# Patient Record
Sex: Male | Born: 1935 | Race: Black or African American | Hispanic: No | Marital: Married | Smoking: Former smoker
Health system: Southern US, Community
[De-identification: ages and names within clinical notes are randomized; demographics above are authoritative.]

## PROBLEM LIST (undated history)

## (undated) DIAGNOSIS — K648 Other hemorrhoids: Secondary | ICD-10-CM

## (undated) DIAGNOSIS — I509 Heart failure, unspecified: Secondary | ICD-10-CM

## (undated) DIAGNOSIS — I739 Peripheral vascular disease, unspecified: Secondary | ICD-10-CM

## (undated) DIAGNOSIS — I255 Ischemic cardiomyopathy: Secondary | ICD-10-CM

## (undated) DIAGNOSIS — I779 Disorder of arteries and arterioles, unspecified: Secondary | ICD-10-CM

## (undated) DIAGNOSIS — I2510 Atherosclerotic heart disease of native coronary artery without angina pectoris: Secondary | ICD-10-CM

## (undated) DIAGNOSIS — E785 Hyperlipidemia, unspecified: Secondary | ICD-10-CM

## (undated) DIAGNOSIS — I10 Essential (primary) hypertension: Secondary | ICD-10-CM

## (undated) DIAGNOSIS — Z9289 Personal history of other medical treatment: Secondary | ICD-10-CM

## (undated) DIAGNOSIS — F0390 Unspecified dementia without behavioral disturbance: Secondary | ICD-10-CM

## (undated) DIAGNOSIS — K279 Peptic ulcer, site unspecified, unspecified as acute or chronic, without hemorrhage or perforation: Secondary | ICD-10-CM

## (undated) DIAGNOSIS — N183 Chronic kidney disease, stage 3 unspecified: Secondary | ICD-10-CM

## (undated) DIAGNOSIS — C61 Malignant neoplasm of prostate: Secondary | ICD-10-CM

## (undated) HISTORY — DX: Peptic ulcer, site unspecified, unspecified as acute or chronic, without hemorrhage or perforation: K27.9

## (undated) HISTORY — DX: Malignant neoplasm of prostate: C61

## (undated) HISTORY — DX: Disorder of arteries and arterioles, unspecified: I77.9

## (undated) HISTORY — DX: Other hemorrhoids: K64.8

## (undated) HISTORY — DX: Personal history of other medical treatment: Z92.89

## (undated) HISTORY — DX: Hyperlipidemia, unspecified: E78.5

## (undated) HISTORY — DX: Peripheral vascular disease, unspecified: I73.9

---

## 1999-05-22 ENCOUNTER — Other Ambulatory Visit: Admission: RE | Admit: 1999-05-22 | Discharge: 1999-05-22 | Payer: Self-pay | Admitting: *Deleted

## 1999-06-11 ENCOUNTER — Encounter: Admission: RE | Admit: 1999-06-11 | Discharge: 1999-09-09 | Payer: Self-pay | Admitting: Radiation Oncology

## 1999-08-02 ENCOUNTER — Encounter: Admission: RE | Admit: 1999-08-02 | Discharge: 1999-08-02 | Payer: Self-pay | Admitting: *Deleted

## 1999-08-02 ENCOUNTER — Encounter: Payer: Self-pay | Admitting: *Deleted

## 1999-08-09 ENCOUNTER — Ambulatory Visit (HOSPITAL_BASED_OUTPATIENT_CLINIC_OR_DEPARTMENT_OTHER): Admission: RE | Admit: 1999-08-09 | Discharge: 1999-08-09 | Payer: Self-pay | Admitting: *Deleted

## 1999-08-09 ENCOUNTER — Encounter: Payer: Self-pay | Admitting: *Deleted

## 1999-09-02 ENCOUNTER — Encounter: Payer: Self-pay | Admitting: Radiation Oncology

## 1999-09-20 ENCOUNTER — Encounter: Admission: RE | Admit: 1999-09-20 | Discharge: 1999-12-19 | Payer: Self-pay | Admitting: Radiation Oncology

## 1999-09-28 ENCOUNTER — Emergency Department (HOSPITAL_COMMUNITY): Admission: EM | Admit: 1999-09-28 | Discharge: 1999-09-29 | Payer: Self-pay | Admitting: Emergency Medicine

## 1999-09-29 ENCOUNTER — Emergency Department (HOSPITAL_COMMUNITY): Admission: EM | Admit: 1999-09-29 | Discharge: 1999-09-29 | Payer: Self-pay | Admitting: Emergency Medicine

## 1999-10-02 ENCOUNTER — Emergency Department (HOSPITAL_COMMUNITY): Admission: EM | Admit: 1999-10-02 | Discharge: 1999-10-02 | Payer: Self-pay

## 1999-10-03 ENCOUNTER — Emergency Department (HOSPITAL_COMMUNITY): Admission: EM | Admit: 1999-10-03 | Discharge: 1999-10-03 | Payer: Self-pay | Admitting: Emergency Medicine

## 2000-07-03 ENCOUNTER — Emergency Department (HOSPITAL_COMMUNITY): Admission: EM | Admit: 2000-07-03 | Discharge: 2000-07-04 | Payer: Self-pay | Admitting: Emergency Medicine

## 2000-07-04 ENCOUNTER — Encounter: Payer: Self-pay | Admitting: Emergency Medicine

## 2000-12-30 ENCOUNTER — Ambulatory Visit (HOSPITAL_COMMUNITY): Admission: RE | Admit: 2000-12-30 | Discharge: 2000-12-30 | Payer: Self-pay | Admitting: Gastroenterology

## 2001-05-12 HISTORY — PX: CARDIAC SURGERY: SHX584

## 2001-06-17 ENCOUNTER — Inpatient Hospital Stay (HOSPITAL_COMMUNITY): Admission: EM | Admit: 2001-06-17 | Discharge: 2001-06-22 | Payer: Self-pay | Admitting: Emergency Medicine

## 2001-06-17 ENCOUNTER — Encounter: Payer: Self-pay | Admitting: Emergency Medicine

## 2001-11-09 ENCOUNTER — Inpatient Hospital Stay (HOSPITAL_COMMUNITY): Admission: EM | Admit: 2001-11-09 | Discharge: 2001-11-15 | Payer: Self-pay | Admitting: *Deleted

## 2001-11-11 ENCOUNTER — Encounter: Payer: Self-pay | Admitting: Cardiology

## 2001-12-01 ENCOUNTER — Inpatient Hospital Stay (HOSPITAL_COMMUNITY): Admission: EM | Admit: 2001-12-01 | Discharge: 2001-12-02 | Payer: Self-pay

## 2001-12-14 ENCOUNTER — Encounter (HOSPITAL_COMMUNITY): Admission: RE | Admit: 2001-12-14 | Discharge: 2002-03-14 | Payer: Self-pay | Admitting: Cardiology

## 2002-01-09 ENCOUNTER — Encounter: Payer: Self-pay | Admitting: Emergency Medicine

## 2002-01-10 ENCOUNTER — Inpatient Hospital Stay (HOSPITAL_COMMUNITY): Admission: EM | Admit: 2002-01-10 | Discharge: 2002-01-11 | Payer: Self-pay | Admitting: Emergency Medicine

## 2002-01-11 ENCOUNTER — Encounter: Payer: Self-pay | Admitting: Cardiology

## 2002-03-15 ENCOUNTER — Encounter (HOSPITAL_COMMUNITY): Admission: RE | Admit: 2002-03-15 | Discharge: 2002-04-14 | Payer: Self-pay | Admitting: Cardiology

## 2002-04-18 ENCOUNTER — Encounter: Payer: Self-pay | Admitting: *Deleted

## 2002-04-18 ENCOUNTER — Inpatient Hospital Stay (HOSPITAL_COMMUNITY): Admission: EM | Admit: 2002-04-18 | Discharge: 2002-04-20 | Payer: Self-pay | Admitting: Emergency Medicine

## 2002-07-08 ENCOUNTER — Inpatient Hospital Stay (HOSPITAL_COMMUNITY): Admission: EM | Admit: 2002-07-08 | Discharge: 2002-07-21 | Payer: Self-pay | Admitting: Emergency Medicine

## 2002-07-08 ENCOUNTER — Encounter: Payer: Self-pay | Admitting: Cardiology

## 2002-07-14 ENCOUNTER — Encounter: Payer: Self-pay | Admitting: Cardiothoracic Surgery

## 2002-07-15 ENCOUNTER — Encounter: Payer: Self-pay | Admitting: Cardiothoracic Surgery

## 2002-07-16 ENCOUNTER — Encounter: Payer: Self-pay | Admitting: Cardiothoracic Surgery

## 2002-07-17 ENCOUNTER — Encounter: Payer: Self-pay | Admitting: Cardiothoracic Surgery

## 2002-08-08 ENCOUNTER — Encounter (HOSPITAL_COMMUNITY): Admission: RE | Admit: 2002-08-08 | Discharge: 2002-11-06 | Payer: Self-pay | Admitting: Cardiology

## 2002-08-12 ENCOUNTER — Emergency Department (HOSPITAL_COMMUNITY): Admission: EM | Admit: 2002-08-12 | Discharge: 2002-08-12 | Payer: Self-pay | Admitting: Emergency Medicine

## 2002-08-16 ENCOUNTER — Ambulatory Visit (HOSPITAL_COMMUNITY): Admission: RE | Admit: 2002-08-16 | Discharge: 2002-08-16 | Payer: Self-pay | Admitting: Cardiothoracic Surgery

## 2002-08-16 ENCOUNTER — Encounter: Payer: Self-pay | Admitting: Cardiothoracic Surgery

## 2002-08-17 ENCOUNTER — Encounter: Admission: RE | Admit: 2002-08-17 | Discharge: 2002-08-17 | Payer: Self-pay | Admitting: Cardiothoracic Surgery

## 2002-08-17 ENCOUNTER — Encounter: Payer: Self-pay | Admitting: Cardiothoracic Surgery

## 2002-11-07 ENCOUNTER — Encounter (HOSPITAL_COMMUNITY): Admission: RE | Admit: 2002-11-07 | Discharge: 2003-02-05 | Payer: Self-pay | Admitting: Cardiology

## 2004-07-01 ENCOUNTER — Ambulatory Visit: Payer: Self-pay | Admitting: Cardiology

## 2004-07-10 ENCOUNTER — Ambulatory Visit: Payer: Self-pay | Admitting: Cardiology

## 2004-07-11 ENCOUNTER — Ambulatory Visit: Payer: Self-pay

## 2004-07-11 ENCOUNTER — Ambulatory Visit: Payer: Self-pay | Admitting: Cardiology

## 2004-09-23 ENCOUNTER — Ambulatory Visit: Payer: Self-pay | Admitting: Cardiology

## 2005-05-14 ENCOUNTER — Ambulatory Visit: Payer: Self-pay | Admitting: Cardiology

## 2005-06-17 ENCOUNTER — Ambulatory Visit: Payer: Self-pay | Admitting: Cardiology

## 2005-08-17 ENCOUNTER — Emergency Department (HOSPITAL_COMMUNITY): Admission: EM | Admit: 2005-08-17 | Discharge: 2005-08-17 | Payer: Self-pay | Admitting: Emergency Medicine

## 2006-07-07 ENCOUNTER — Emergency Department (HOSPITAL_COMMUNITY): Admission: EM | Admit: 2006-07-07 | Discharge: 2006-07-07 | Payer: Self-pay | Admitting: Emergency Medicine

## 2006-10-21 ENCOUNTER — Emergency Department (HOSPITAL_COMMUNITY): Admission: EM | Admit: 2006-10-21 | Discharge: 2006-10-21 | Payer: Self-pay | Admitting: Emergency Medicine

## 2007-02-11 ENCOUNTER — Emergency Department (HOSPITAL_COMMUNITY): Admission: EM | Admit: 2007-02-11 | Discharge: 2007-02-11 | Payer: Self-pay | Admitting: Emergency Medicine

## 2007-03-05 ENCOUNTER — Ambulatory Visit: Payer: Self-pay | Admitting: Cardiology

## 2007-03-30 ENCOUNTER — Encounter: Admission: RE | Admit: 2007-03-30 | Discharge: 2007-03-30 | Payer: Self-pay | Admitting: Sports Medicine

## 2007-04-20 ENCOUNTER — Encounter: Admission: RE | Admit: 2007-04-20 | Discharge: 2007-04-20 | Payer: Self-pay | Admitting: Sports Medicine

## 2008-03-14 ENCOUNTER — Ambulatory Visit: Payer: Self-pay | Admitting: Cardiology

## 2008-04-13 ENCOUNTER — Ambulatory Visit: Payer: Self-pay

## 2008-04-13 ENCOUNTER — Ambulatory Visit: Payer: Self-pay | Admitting: Cardiology

## 2008-04-13 LAB — CONVERTED CEMR LAB
Chloride: 108 meq/L (ref 96–112)
Creatinine, Ser: 1.2 mg/dL (ref 0.4–1.5)
GFR calc Af Amer: 77 mL/min
GFR calc non Af Amer: 63 mL/min
Glucose, Bld: 95 mg/dL (ref 70–99)
LDL Cholesterol: 113 mg/dL — ABNORMAL HIGH (ref 0–99)
Potassium: 4.5 meq/L (ref 3.5–5.1)
Sodium: 142 meq/L (ref 135–145)
Total CHOL/HDL Ratio: 3.8
VLDL: 7 mg/dL (ref 0–40)

## 2008-08-21 ENCOUNTER — Encounter: Admission: RE | Admit: 2008-08-21 | Discharge: 2008-08-21 | Payer: Self-pay | Admitting: Sports Medicine

## 2009-01-09 ENCOUNTER — Encounter (INDEPENDENT_AMBULATORY_CARE_PROVIDER_SITE_OTHER): Payer: Self-pay | Admitting: *Deleted

## 2009-03-16 ENCOUNTER — Telehealth: Payer: Self-pay | Admitting: Cardiology

## 2009-03-22 DIAGNOSIS — I5042 Chronic combined systolic (congestive) and diastolic (congestive) heart failure: Secondary | ICD-10-CM

## 2009-03-22 DIAGNOSIS — E785 Hyperlipidemia, unspecified: Secondary | ICD-10-CM

## 2009-03-22 DIAGNOSIS — I6529 Occlusion and stenosis of unspecified carotid artery: Secondary | ICD-10-CM

## 2009-03-22 DIAGNOSIS — C61 Malignant neoplasm of prostate: Secondary | ICD-10-CM

## 2009-03-22 DIAGNOSIS — I2510 Atherosclerotic heart disease of native coronary artery without angina pectoris: Secondary | ICD-10-CM

## 2009-03-22 DIAGNOSIS — I110 Hypertensive heart disease with heart failure: Secondary | ICD-10-CM | POA: Insufficient documentation

## 2009-03-23 ENCOUNTER — Ambulatory Visit: Payer: Self-pay | Admitting: Cardiology

## 2009-03-23 DIAGNOSIS — I080 Rheumatic disorders of both mitral and aortic valves: Secondary | ICD-10-CM | POA: Insufficient documentation

## 2009-04-06 ENCOUNTER — Ambulatory Visit (HOSPITAL_COMMUNITY): Admission: RE | Admit: 2009-04-06 | Discharge: 2009-04-06 | Payer: Self-pay | Admitting: Cardiology

## 2009-04-06 ENCOUNTER — Encounter: Payer: Self-pay | Admitting: Cardiology

## 2009-04-06 ENCOUNTER — Ambulatory Visit: Payer: Self-pay | Admitting: Internal Medicine

## 2009-04-06 ENCOUNTER — Ambulatory Visit: Payer: Self-pay

## 2009-04-06 ENCOUNTER — Ambulatory Visit: Payer: Self-pay | Admitting: Cardiology

## 2009-04-09 ENCOUNTER — Encounter: Payer: Self-pay | Admitting: Cardiology

## 2009-04-09 LAB — CONVERTED CEMR LAB
Cholesterol: 179 mg/dL (ref 0–200)
LDL Cholesterol: 126 mg/dL — ABNORMAL HIGH (ref 0–99)
Total CHOL/HDL Ratio: 4
VLDL: 13.2 mg/dL (ref 0.0–40.0)

## 2009-04-10 ENCOUNTER — Ambulatory Visit: Payer: Self-pay

## 2009-04-10 ENCOUNTER — Ambulatory Visit: Payer: Self-pay | Admitting: Cardiology

## 2009-04-11 ENCOUNTER — Telehealth: Payer: Self-pay | Admitting: Cardiology

## 2009-10-07 ENCOUNTER — Emergency Department (HOSPITAL_COMMUNITY): Admission: EM | Admit: 2009-10-07 | Discharge: 2009-10-08 | Payer: Self-pay | Admitting: Emergency Medicine

## 2010-04-05 ENCOUNTER — Encounter: Admission: RE | Admit: 2010-04-05 | Discharge: 2010-04-05 | Payer: Self-pay | Admitting: Family Medicine

## 2010-06-01 ENCOUNTER — Encounter: Payer: Self-pay | Admitting: Family Medicine

## 2010-06-02 ENCOUNTER — Encounter: Payer: Self-pay | Admitting: Sports Medicine

## 2010-09-24 NOTE — Assessment & Plan Note (Signed)
[facility]                            CARDIOLOGY OFFICE NOTE   NAME:FOXShamere, Wilhelmi                         MRN:          [id number]  DATE:03/14/2008                            DOB:          [birth year]    PRIMARY CARE PHYSICIAN:  Azalia Bilis, MD.   REASON FOR PRESENTATION:  Evaluate the patient with coronary artery  disease.   HISTORY OF PRESENT ILLNESS:  The patient presents for yearly followup.  He is now 76 years old.  He has done relatively well, since I last saw  him.  As his usual, he is not very clear on his medications.  He does  not even getting verify his primary care doctor.  However, he is a nice  gentleman.  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  He with that level of activity does not get any chest discomfort, neck,  or arm discomfort.  He has not have any palpitations, presyncope, or  syncope.  He denies any PND or orthopnea.  He has little swelling in his  right leg, which goes down after he puts his feet up.  He is taking his  medicines as prescribed, but does not remember the doses.  We called his  wife to verify and have the list as described below.  He has had no  further cardiovascular testing since his plain old exercise treadmill  test (POET) in 2007.   PAST MEDICAL HISTORY:  Coronary artery disease, status post CABG (SVG to  the LAD, SVG to right coronary artery, SVG to marginal in 2003), well-  preserved ejection fraction, elevated CPKs on statin, elevated liver  enzymes on statins, internal hemorrhoids, hypertension, hyperlipidemia,  prostate cancer, status post radiation implants, peptic ulcer disease,  mild (less than 39%) bilateral carotid stenosis.   ALLERGIES AND INTOLERANCES:  ACE INHIBITOR caused anaphylaxis.   MEDICATIONS:  1. Aspirin 81 mg daily.  2. Toprol 25 mg daily.  3. Zetia 10 mg daily.   REVIEW OF SYSTEMS:  As stated in the HPI and otherwise negative for all  other  systems.   PHYSICAL EXAMINATION:  GENERAL:  The patient is pleasant in no distress.  VITAL SIGNS:  Blood pressure 150/74, heart rate 56 and regular, weight  199 pounds, and body mass index 31.  HEENT:  Eyelids are unremarkable; pupils equal, round, and reactive to  light; fundi not visualized; oral mucosa unremarkable.  NECK:  No jugular venous distention at 45 degrees; soft left carotid  bruit; no thyromegaly.  LYMPHATICS:  No cervical, axillary, or inguinal adenopathy.  LUNGS:  Clear to auscultation bilaterally.  BACK:  No costovertebral angle tenderness.  CHEST:  Unremarkable except for well-healed sternotomy scar.  HEART:  PMI not displaced or sustained; S1 and S2 within normal limits;  no S3, no S4; no clicks, no rubs, no murmurs.  ABDOMEN:  Flat; positive bowel sounds; normal in frequency and pitch; no  bruits, rebound, guarding, or midline pulsatile mass; no hepatomegaly;  no splenomegaly.  SKIN:  No rashes, no nodules.  EXTREMITIES:  Upper pulses 2+, 2+ femorals  with left femoral bruit, 2+  right posterior tibialis and 1+ left posterior tibialis, right mild  ankle edema; no cyanosis, no clubbing.  NEURO:  Oriented to person,  place, and time; cranial nerves II through XII grossly intact; motor  grossly intact.   EKG; sinus bradycardia, rate 56, left axis deviation, poor anterior R-  wave progression, no acute ST-T wave changes.   ASSESSMENT AND PLAN:  1. Coronary artery disease.  The patient is having no new symptoms.      He had a negative stress test in 2007.  At this point, no further      cardiovascular testing is suggested.  Instead, we will continue      with aggressive risk reduction.  2. Hypertension.  Blood pressure is elevated and it was the last time      as well.  I am going to take the liberty of adding a low-dose      hydrochlorothiazide 12.5 mg to his existing regimen.  We will get a      BMET in 2 weeks.  He can have his blood pressure followed by his       primary care doctor and here when I see him.  He also needs      therapeutic lifestyle changes (TLC) to include weight loss to bring      his blood pressure to target.  3. Dyslipidemia, when he comes back for his lab work in 2 weeks, we      will get a fasting lipid profile.  The goal would be an LDL less      than 100 and HDL greater than 40.  4. Carotid stenosis.  The patient had some mild carotid plaque in the      past.  He does have a bruit.  I am going to get a Doppler as it has      been several years.  5. Followup.  I will see the patient back yearly or sooner if needed.     [name], MD, [facility]  Electronically Signed    JH/MedQ  DD: 03/14/2008  DT: 03/15/2008  Job #: [id number]   cc:   Azalia Bilis, M.D.

## 2010-09-24 NOTE — Assessment & Plan Note (Signed)
[facility]                            CARDIOLOGY OFFICE NOTE   NAME:[name], [name]                         MRN:          [id number]  DATE:03/05/2007                            DOB:          [birth year]    PRIMARY CARE PHYSICIAN:  Azalia Bilis, MD.   REASON FOR VISIT:  Evaluate patient with coronary disease.   HISTORY OF PRESENT ILLNESS:  The patient returns for a followup.  It has  been about 18 months since I last saw him.  He is now 75 years old.  He  is still working at [employer].  However, he has had problems recently  because he had some back injury.  He is being evaluated by Raliegh Ip.  With his level of activity, he is denying any chest discomfort,  neck or arm discomfort.  He has not been having any palpitations,  presyncope, or syncope.  He has been having no PND or orthopnea.  Unfortunately, he stopped taking his medicines.  I have tried to  encourage him over time to continue these drugs, but I do not believe he  has for probably years.  He did have a stress test last year, which was  a plain old exercise treadmill (POET).  With this, he had no evidence of  ischemia and a reasonable exercise tolerance.   PAST MEDICAL HISTORY:  1. Coronary artery disease status post CABG (saphenous vein graft to      an LAD, saphenous vein graft to the right coronary artery,      saphenous vein graft to a marginal in 2003).  2. A well-preserved ejection fraction.  3. Elevated CPKs on statin.  4. Elevated liver enzymes on statins.  5. Internal hemorrhoids.  6. Hypertension.  7. Hyperlipidemia.  8. Prostate cancer status post radiation implants.  9. Peptic ulcer disease.  10.Mild (less than 39%) bilateral carotid stenosis.   ALLERGIES:  ACE INHIBITORS CAUSED ANAPHYLAXIS.   MEDICATIONS:  He is currently not taking any.  He was supposed to be  taking:  1. Aspirin 81 mg daily.  2. Toprol 25 mg daily.  3. Zetia 10 mg daily.   REVIEW OF SYSTEMS:   As stated in the HPI and otherwise negative for  other systems.   PHYSICAL EXAMINATION:  GENERAL:  The patient is in no distress.  VITAL SIGNS:  Blood pressure 148/78, heart rate 80 and regular, weight  193 pounds, body mass index 29.  HEENT:  Eyelids unremarkable, pupils equal, round, and react to light,  fundi not visualized, oral mucosa unremarkable.  NECK:  No jugulovenous distention at 45 degrees, carotid upstroke brisk  and symmetric, no bruits, no thyromegaly.  LYMPHATICS:  No adenopathy.  LUNGS:  Clear to auscultation bilaterally.  BACK:  No costovertebral angle tenderness.  CHEST WALL:  A healed sternotomy scar.  HEART:  PMI not displaced or sustained, S1 and S2 within normal limits,  no S3, no S4, no clicks, no rubs, no murmurs.  ABDOMEN:  Mildly obese, positive bowel sounds normal in frequency and  pitch, no bruits, rebound, guarding, or a  midline pulsatile mass, no  hepatomegaly, no splenomegaly.  SKIN:  No rashes, no nodules.  EXTREMITIES:  2+ pulses throughout, no edema, no cyanosis, no clubbing,  bilateral femoral bruits.  NEURO:  Oriented to person, place, and time, cranial nerves II-XII  grossly intact, motor grossly intact.   EKG:  Sinus rhythm, rate 80, left axis deviation, nonspecific diffuse T-  wave changes.   ASSESSMENT AND PLAN:  1. Coronary disease.  The patient is having no ongoing symptoms.  He      had a negative exercise treadmill test last year.  No further      cardiovascular testing is suggested.  However, I wish he would      participate more in secondary risk reduction.  2. Hypertension.  Blood pressure is slightly elevated.  His heart rate      is up a little bit, too.  I would like to have him back on the beta      blocker and we will renew this prescription for the same dose.  3. Dyslipidemia.  He has been INTOLERANT TO STATINS.  I am going to      ask him to restart the Zetia.  4. Followup.  I will see him back in one year or sooner if  needed.     [name], MD, [facility]  Electronically Signed    JH/MedQ  DD: 03/05/2007  DT: 03/06/2007  Job #: [id number]   cc:   Azalia Bilis, M.D.

## 2010-09-27 NOTE — Discharge Summary (Signed)
NAMEWIKTOR, [name] NO.:  [id number]   MEDICAL RECORD NO.:  [id number]                   PATIENT TYPE:  INP   LOCATION:  2017                                 FACILITY:  [facility]   PHYSICIAN:  Dola Argyle, M.D. [facility]              DATE OF BIRTH:  Sep 03, 1934   DATE OF ADMISSION:  04/18/2002  DATE OF DISCHARGE:  04/20/2002                           DISCHARGE SUMMARY - REFERRING   DISCHARGE DIAGNOSES:  1. Chest pain, resolved.  2. Hemorrhoids.  3. Known coronary artery disease.  4. Anemia.  5. Hypertension.  6. Hyperlipidemia.  7. Prostate cancer.  8. Left carotid stenosis, 50% left carotid stenosis.   HISTORY OF PRESENT ILLNESS:  The patient is a 76 year old male patient who  presented to the [facility] Emergency Room on April 18, 2002, for an evaluation of chest pain.  At this point he was noted also  to be severely anemic with a hemoglobin on presentation of 7.7.  He does  have a history of peptic ulcer disease and is treated by Dr. Jeneen Rinks L.  Edwards.  In addition, he has a known history of coronary artery disease and  had previously received a stent to the circumflex in February 2003.  A  Cardiolite scan in August 2003, was negative for ischemia with an ejection  fraction of 60%.  His electrocardiogram revealed sinus bradycardia, rate of  52, with no abnormality.  He did have some T-wave inversion in the inferior  leads, but this was unchanged since the last tracing.   LABORATORY DATA:  During the hospitalization revealed a hemoglobin of 8.6,  hematocrit 27.8, platelets 276.  Hemoccult negative.  Sodium 136, potassium  4.0, BUN 17, creatinine 1.1, albumin 2.9.  Her ______ enzymes were negative.  A total cholesterol 151, triglycerides 74, HDL 47, LDL 89.   HOSPITAL COURSE:  Because of his anemia, Dr. Oletta Lamas was consulted.  The  patient also underwent a transfusion during his hospitalization.  The  patient then underwent a  colonoscopy, and was found to have hemorrhoids, as  well as mild radiation proctitis.  Because his cardiac workup was negative,  and no source of bleeding was found, the patient was felt to be safe to go  home on April 20, 2002.   DISCHARGE MEDICATIONS:  1. Toprol XL 50 mg one p.o. q.d.  2. Protonix 40 mg one  p.o. q.d.  3. Enteric-coated aspirin one p.o. q.d.  4. Altace 5 mg one p.o. b.i.d.  5. Sublingual nitroglycerin p.r.n. chest pain.   ACTIVITY:  As tolerated.   DIET:  To resume his previous diet.   INSTRUCTIONS:  To call with questions or concerns.   FOLLOW UP:  He needs to call and schedule a followup with Dr. Oletta Lamas.  He  has a follow-up appointment with Dr. [name] on May 26, 2002, at  1:15 p.m.  Joesphine Bare, P.A. [facility]                      Dola Argyle, M.D. [facility]    LB/MEDQ  D:  05/23/2002  T:  05/23/2002  Job:  [id number]   cc:   Jeneen Rinks L. Rolla Flatten., M.D.  [address] [place]  [place] [address]  Fax: [phone]   [name], M.D.

## 2010-09-27 NOTE — Consult Note (Signed)
[facility]  Patient:    [name], [name] Visit Number: [id number] MRN: [id number]          Service Type: MED Location: [id number] Attending Physician:  [name] Dictated by:   Valentina Gu. Roxy Manns, M.D. Proc. Date: 11/10/01 Admit Date:  11/09/2001   CC:         [name], M.D. [facility] D. Lia Foyer, M.D. [facility]  Minette Brine, M.D.  [facility] Office   Consultation Report  REQUESTING PHYSICIAN:  Loretha Brasil. Lia Foyer, M.D.  PRIMARY CARDIOLOGIST:  [name], M.D.  PRIMARY CARE PHYSICIAN:  Minette Brine, M.D.  REASON FOR CONSULTATION:  Three-vessel coronary artery disease with class IV angina.  HISTORY OF PRESENT ILLNESS:  The patient is a 76 year old African-American male from [place] who works for [employer] as a Administrator. 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 He underwent cardiac catheterization by Dr. Percival Spanish and was found to have significant coronary artery disease. He ultimately underwent percutaneous coronary intervention with stenting of the left circumflex coronary artery at that time by Dr. Olevia Perches. The patient initially did well. The patient had a followup stress Cardiolite exam in March of this year. This test was notable for development of symptoms of chest pain as well as positive EKG changes during stress. However, the scintigraphic results were felt to be insignificant. The patient was treated medically. He was noted to have a tight ostial right coronary artery lesion at the time of his original catheterization which was not treated percutaneously and felt not to be approachable from percutaneous. The patient now returns with persistent episodes of chest pain. Symptoms are somewhat atypical at time, although he describes transient pounding and pressing-like pain  radiating across his left chest which seems to come and go sporadically both with exertion and at rest. The patient does report that his chest pain seems to be relieved by sublingual nitroglycerin. He was admitted to the hospital and ruled out for acute myocardial infarction. He underwent cardiac catheterization today by Dr. Lia Foyer. This demonstrates severe three-vessel coronary artery disease with normal left ventricular function. Cardiac surgical consultation is requested.  REVIEW OF SYSTEMS:  GENERAL:  The patient reports overall feeling reasonably well. He denies any problems with loss of appetite or recent weight gain or weight loss. CARDIAC:  Notable for chest pain as previously described. The patient denies any episode of resting shortness of breath. He does get short of breath with strenuous physical activity. He denies any history of PND, orthopnea, palpitations, syncope, or near syncopal episodes. He does have some chronic bilateral lower extremity edema. RESPIRATORY:  Notable only for mild dyspnea on exertion. The patient denies any problems with productive cough, hemoptysis, or wheezing. GASTROINTESTINAL:  Notable for good appetite with normal swallowing and no symptoms of dysphagia or odynophagia. The patient denies problems with chronic constipation or diarrhea. He does have occasional episodes of bright red blood per rectum. He apparently has been evaluated by Dr. Laurence Spates and found to have hemorrhoids as well as radiation proctitis related to history of prostate cancer in the past. NEUROLOGICAL:  Notable only for occasional transient episodes of numbness in the fingertips of both hands. He is told that he may have bilateral carpal tunnel syndrome. He denies any symptoms of transient monocular blindness or transient numbness or weakness involving either upper or lower  extremity. MUSCULOSKELETAL:  Notable for mild arthritis although the patient denies any particular  problems with any specific joints. GENITOURINARY:  Negative at present. The patient denies any current symptoms of dysuria, hematuria, urinary dribbling or hesitancy. He does have history of prostate cancer in the past for which he if followed by Dr. Leory Plowman. INFECTIOUS: Negative. The patient denies recent fevers or chills. HEMATOLOGIC:  Negative, although notable for mild chronic anemia presumably related to chronic GI blood loss. ENDOCRINE:  Negative. PSYCHIATRIC: Negative.  PERIPHERAL VASCULAR:  Negative. The patient denies symptoms of claudication. HEENT:  Negative.  PAST MEDICAL HISTORY:  Notable for a history of hypertension and hyperlipidemia. The patient denies any known history of diabetes mellitus or previous stroke. The patient has history of prostate cancer for which he has undergone radiation therapy with seed placement in the past, several years ago. He has been followed by Dr. Leory Plowman and apparently he was told that his PSA level was normal recently. Has history of iron deficient anemia and has undergone recent colonoscopy by Dr. Laurence Spates. By report, this was notable for findings of hemorrhoids and radiation proctitis.  FAMILY HISTORY:  Notable for the absence of early onset coronary artery disease. The patient does have one brother who probably had coronary artery disease although they are not sure.  SOCIAL HISTORY:  The patient works as a Administrator for [employer]. He lives with his wife. They have several children and numerous grandchildren. He is not very active physically when he is not working. He is a nonsmoker. He smoked some in the distant past. The patient does report occasional alcohol consumption which can be heavy on the weekends.  CURRENT MEDICATIONS: 1. Toprol-XL 50 mg p.o. once daily. 2. Lipitor 10 mg p.o. once daily.  3. Altace 2.5 mg p.o. twice daily. 4. Aspirin 81 mg p.o. once daily. 5. Foltx one tablet once daily. 6. Sublingual  nitroglycerin as needed.  ALLERGIES:  The patient denies any known drug allergies or sensitivities.  PHYSICAL EXAMINATION:  GENERAL:  A well-appearing mildly obese African-American male who appears his stated age in no acute distress.  VITAL SIGNS:  He is currently in normal sinus rhythm and remains normotensive and afebrile.  SKIN:  Clean and dry and healthy-appearing throughout.  HEENT:  Grossly within normal limits.  NECK:  Supple. There are no cervical or supraclavicular lymphadenopathy. There is no jugular venous distention. No carotid bruits are noted.  CHEST:  Auscultation of the chest reveals clear and symmetrical breath sounds bilaterally. There are few inspiratory crackles in both lung bases. No wheezes or rhonchi are noted.  CARDIOVASCULAR:  Demonstrates a regular rate and rhythm. No murmurs, rubs or gallops are appreciated.  ABDOMEN:  Mildly obese, soft, and nontender. There are no palpable masses. The liver edge is not enlarged. Bowel sounds are present.  EXTREMITIES:  Warm and well perfused. There is trace bilateral lower extremity edema. There is no significant venous insufficiency. Distal pulses are thready and really not palpable in either lower leg at the ankle.  NEUROLOGICAL:  Grossly nonfocal and symmetrical throughout.  RECTAL AND GENITOURINARY:  Both deferred.  The remainder of his physical exam is unrevealing.  DIAGNOSTIC DATA:  Cardiac catheterization performed by Dr. Lia Foyer is reviewed. This demonstrates severe three-vessel coronary artery disease with normal left ventricular function. Specifically, there is 30% stenosis of the left main coronary artery. There is 40-50% proximal stenosis of the left anterior descending coronary artery with an 80% stenosis of the midportion of this  vessel. There is another 70-80% stenosis in the distal left anterior descending coronary artery. There are several small diagonal branches which come off of the left  anterior descending coronary artery which are all very small and will not be suitable targets for grafting. There is 90% stenosis of the mid left circumflex coronary artery which gives rise to a single dominant first circumflex marginal branch. There is 90% ostial stenosis and 90% stenosis of the mid right coronary artery. The distal right coronary artery and posterior descending coronary artery are somewhat small in caliber. There is normal left ventricular function with no significant wall motion abnormalities.  IMPRESSION:  Severe three-vessel coronary artery disease with class IV unstable angina and normal left ventricular function. I believe that the patient would benefit from coronary artery bypass grafting.  PLAN:  I have outlined at length the indications and potential benefits of coronary artery bypass grafting with the patient and his wife. Alternative treatment strategies have been discussed. They understand and accept all associated risks of surgery including but not limited to risk of death, stroke, myocardial infarction, respiratory failure, congestive heart failure, renal failure, bleeding requiring blood transfusion, arrhythmia, infection, and recurrent coronary artery disease. At this point, they are not currently ready to make a decision about whether or not to proceed with surgery. I have offered them the potential opportunity of doing surgery tomorrow. However, they would like to discuss matters further with their family and think things over for a period of time.  All of their questions have been addressed. We will continue to follow along and schedule surgery if they are agreeable at some point in the future. Dictated by:   Valentina Gu Roxy Manns, M.D. Attending Physician:  [name] DD:  11/10/01 TD:  11/14/01 Job: [id number]

## 2010-09-27 NOTE — Consult Note (Signed)
[place]. [facility]  Patient:    [name], [name] Visit Number: [id number] MRN: [id number]          Service Type: MED Location: 2000 2013 01 Attending Physician:  [name] Dictated by:   Elyse Jarvis Amedeo Plenty, M.D. Proc. Date: 11/12/01 Admit Date:  12/01/2001 Discharge Date: 12/02/2001   CC:         Minette Brine, M.D.  [name], M.D. [facility]  James L. Oletta Lamas, M.D.   Consultation Report  REASON FOR CONSULTATION:  Chest pain, heme-postive stools, and anemia.  HISTORY OF PRESENT ILLNESS:  The patient is a 76 year old black male who presented on July 1 with left-sided chest pain radiating up into the neck.  He had had a cardiac catheterization and angioplasty in February.  The pain was reminiscent of pain he had at that time.  The patient was treated with morphine and nitroglycerin and underwent repeat cardiac catheterization yesterday and underwent PCI of the circumflex.  He has had heme-positive stools this admission and a fallen hemoglobin from 10.5 to 9.  He has occasional rectal bleeding.  He denies any classic heartburn.  He does admit to some rare solid food dysphagia.  He had a colonoscopy Dr. Laurence Spates in August 2002, which showed radiation proctitis.  He had had radiation seed implant in March 2001.  He does not use nonsteroidal anti-inflammatory drugs but was on an enteric-coated aspirin a day and Plavix after his last hospitalization and it is anticipated he will need this in the future if there is no GI contraindications.  PAST MEDICAL HISTORY: 1. Prostate cancer, status post radiation seed implant. 2. Coronary artery disease, status post circumflex artery stenting    February 2003. 3. Mild hyperlipidemia. 4. Hypertension.  PAST SURGICAL HISTORY:  Prostatectomy 20 years ago.  ALLERGIES:  None known.  MEDICATIONS: 1. Toprol XL. 2. Lipitor. 3. Altace. 4. Aspirin. 5. Nitroglycerin p.r.n.  SOCIAL HISTORY:  The patient  has three children.  He denies alcohol and has not smoked cigarettes in approximately 20 years.  He is a Administrator for [employer].  FAMILY HISTORY:  Negative for GI malignancy.  PHYSICAL EXAMINATION:  GENERAL:  A well-developed, well-nourished black male in no acute distress.  HEART:  Regular rate and rhythm without murmur.  LUNGS:  Clear.  ABDOMEN:  Soft, nondistended.  Normoactive bowel sounds.  No hepatosplenomegaly, masses, or guarding.  LABORATORY DATA:  Hemoglobin 10.3, MCV 65.  IMPRESSION: 1. Heme-positive stools. 2. Chest pain of unclear etiology. 3. Anemia. 4. History of radiation proctitis with otherwise normal colonoscopy in    October 2002.  PLAN:  I do not think he needs repeat colonoscopy at this point but definitely needs an esophagogastroduodenoscopy to assess for esophageal cause of his chest pain and other cause of gastrointestinal blood loss.  We will procedure with esophagogastroduodenoscopy tomorrow and we continue on Protonix which was started this admission. Dictated by:   Elyse Jarvis Amedeo Plenty, M.D. Attending Physician:  [name] DD:  11/12/01 TD:  11/16/01 Job: [id number]

## 2010-09-27 NOTE — Discharge Summary (Signed)
[name], [name]                          ACCOUNT NO.:  [id number]   MEDICAL RECORD NO.:  [id number]                   PATIENT TYPE:  INP   LOCATION:  2013                                 FACILITY:  [facility]   PHYSICIAN:  Ivin Poot, M.D.               DATE OF BIRTH:  06-14-1934   DATE OF ADMISSION:  07/08/2002  DATE OF DISCHARGE:  07/19/2002                                 DISCHARGE SUMMARY   PRIMARY ADMITTING DIAGNOSIS:  Chest pain.   ADDITIONAL DISCHARGE DIAGNOSES:  1. History of coronary artery disease, status post previous percutaneous     transluminal coronary angiography and stenting.  2. History of prostate cancer, status post radiation therapy and radiation     proctitis.  3. Hypertension.  4. Left internal carotid artery stenosis, 50%.  5. Hyperlipidemia.  6. Postoperative anemia.   PROCEDURES PERFORMED:  1. Cardiac catheterization.  2. Coronary artery bypass grafting x 3 (saphenous vein graft to the left     anterior descending, saphenous vein graft to the right coronary artery,     saphenous vein graft to the circumflex marginal).  3. Endoscopic vein harvest, right thigh to right lower leg, open vein     harvest from left thigh.   HISTORY OF PRESENT ILLNESS:  The patient is a 76 year old white male with a  history of coronary artery disease.  He is status post PTCA and stenting in  February of 2003.  In July, he developed restenosis and was treated with a  cutting balloon and brachytherapy.  Since December, he has had intermittent  episodes of chest pain and actually came in for evaluation in December.  At  that time, his enzymes were negative.  He has continued to have problems  with chest pain on exertion with associated dyspnea.  He contacted Dr. Jeneen Rinks  Hochrein's office when his most recent episode occurred with minimal  exertion and was associated with increased fatigue and shortness of breath.  He was referred to the emergency department and was  found to have EKG  changes, specifically ST abnormalities.  He was admitted for further  evaluation and treatment.   HOSPITAL COURSE:  He was seen by cardiology and ultimately underwent cardiac  catheterization which showed severe three-vessel coronary artery disease,  which was not felt to be amenable to percutaneous intervention.  It was felt  that at this time he would benefit from surgical revascularization.  A  cardiothoracic surgery consultation was obtained and it was agreed that he  would benefit from surgery.  After a complete preoperative work-up was  undertaken, he was taken to the operating room on July 14, 2002, where he  underwent CABG x 3 with the above-noted grafts by Ivin Poot, M.D.  The  left internal mammary artery was evaluated, but was noted to be thickened  and stenotic, therefore was not used as a conduit.  He tolerated  the  procedure well and was transferred to the SICU in stable condition.  He was  extubated shortly after surgery.  He remained in the ICU for overnight  observation.  By postoperative day #2, he was ready for transfer to the  floor.  He was noted to be anemic with a hemoglobin of 6.9 and a hematocrit  of 21.9.  He was transfused two units of packed red blood cells.  This  improved his H&H to 8.3 and 26.4, respectively.  Postoperatively, he has  done well otherwise.  He has been transferred to the floor at this time.  He  is ambulating without difficulty with cardiac rehabilitation phase 1.  The  surgical incision sites are all healing well.  He had been diuresed back to  within 3 pounds of his preoperative weight.  His other labs have remained  stable.  His blood pressure has steadily been increasing.  At the present  time, his Altace, which he was on at home, has been restarted.  He has  remained in sinus rhythm.  He has been afebrile and all vital signs have  been stable.  He is tolerating a regular diet and having normal bowel and  bladder  function.  It is felt that if he continues to remain stable, he will  be discharged home on July 19, 2002.   DISCHARGE MEDICATIONS:  1. Enteric-coated aspirin 325 mg daily.  2. Toprol XL 25 mg daily.  3. Folic acid 1 mg daily.  4. Lasix 20 mg daily x 1 week.  5. K-Dur 10 mEq daily x 1 week.  6. Altace 5 mg b.i.d.  7. Protonix 40 mg daily.  8. Ultram 50-100 mg q.4h. p.r.n. for pain.   ACTIVITY:  He is to refrain from driving, heavy lifting, or strenuous  activity.  He may continue daily walking with use of his incentive  spirometer.   WOUND CARE:  He may shower daily and clean his incisions with soap and  water.   DIET:  He is asked to continue a low-fat, low-sodium diet.   DISCHARGE FOLLOW-UP:  He will see [name], M.D., in the office in two  weeks and have a chest x-ray at that time.  He will then follow up with  Ivin Poot, M.D., in three weeks and should bring his chest x-ray for  Dr. Prescott Gum to review.  He is asked to call our office if he experiences  any redness, swelling, or increased drainage from the incision site, fever  greater than 101 degrees, chest pain, shortness of breath, or any new onset  of symptoms.     Suzzanne Cloud, P.A.                        Ivin Poot, M.D.    GC/MEDQ  D:  07/18/2002  T:  07/19/2002  Job:  [id number]   cc:   [name], M.D. [facility]   Azalia Bilis, M.D.  [place] [place], [place] [address]  Fax: [phone]

## 2010-09-27 NOTE — Discharge Summary (Signed)
. [facility]  Patient:    [name], [name] Visit Number: [id number] MRN: [id number]          Service Type: MED Location: [id number] Attending Physician:  Christy Sartorius Dictated by:   Mannie Stabile, P.A. Admit Date:  06/17/2001 Disc. Date: 06/22/01   CC:         Jeneen Rinks L. Oletta Lamas, M.D.   Discharge Summary  PROCEDURE:  Coronary angiogram/STAT, circumflex, June 21, 2001.  REASON FOR ADMISSION:  [name] is a 76 year old male, with no prior history of coronary artery disease, with cardiac risk factors notable for hypertension, history of hypercholesterolemia, and remote tobacco, who was at Dr. Altamese Cabal home to pick up a piece of furniture for [employer]. While there, he reported to Dr. Caryl Comes that he had been experiencing some chest pain that day en route to his home.  He denied any associated dyspnea, diaphoresis, or nausea.  It was described as a "pressure" and rated a 3-4/10. He was still having pain when he presented to Dr. Aquilla Hacker home and was instructed by Dr. Caryl Comes to proceed directly to the emergency room.  LABORATORY AND ACCESSORY DATA:  Cardiac enzymes:  Elevated total CPK (peak 490) with marginally elevated MB 5.1 at admission and subsequent negative MB fraction; troponin I marker was negative x3.  Lipid profile:  Total cholesterol of 176, triglycerides 73, HDL 48, LDL 113 (cholesterol/HDL ratio 3.7).  Sodium 139, potassium 3.5, glucose 152, BUN 11, creatinine 1.1 on admission.  Normal liver enzymes.  INR 0.9.  Normal CBC on admission. Hemoglobin 11.8 at discharge.  Occult blood:  Heme-positive.  Urine culture: Probable contamination.  Admission CXR:  NAD.  HOSPITAL COURSE:  Patient was placed on aspirin, Lopressor, intravenous nitroglycerin and heparin with plans to proceed with diagnostic coronary angiography.  Admission EKG notable for T wave inversion.  Serial cardiac enzymes revealed elevated total CPKs but negative MB  (save for initial 5.1) and all troponin I markers negative.  Prior to the coronary angiogram, however, patient did have intermittent BRBPR. He reported history of this in the past and had been seen by Dr. Jeneen Rinks L. Edwards for GI evaluation.  He did have some recurrent, intermittent BRBPR after his coronary angiogram as well.  However, given that his hemoglobin remained stable, recommendation was to proceed with outpatient GI evaluation with Dr. Oletta Lamas.  Coronary angiogram, performed June 21, 2001, by Dr. [name] (see cath report for full details) revealed essentially severe two-vessel CAD with normal LV function.  Specifically, there was 25% distal LMCA; 25% proximal, 50% mid-LAD; 30% ostial, 70% mid-CFX with diffuse, distal plaquing; 90% ostial, 50% long mid, and 90% mid-RCA.  LV function normal (EF 60%).  Dr. Vanna Scotland. Brodie then proceeded with successful stenting of the 80% CFX lesion to less than 10% residual stenosis.  He did note the BRBPR and thus discontinued postop Integrilin.  A followup CBC the following morning was stable (hemoglobin 11.8) -- as compared to 12.9 on admission.  Patient was cleared for discharge the following morning in hemodynamically stable condition.  Patient will remain on Plavix x4 weeks and aspirin 81 mg indefinitely.  During discussion of the discharge medications with patient, it was revealed that he had been on a "BP" pill at home.  However, we do not know what this medication is.  We therefore agreed to have him continue this at home before starting the Altace and we will decide at time of office followup whether to  substitute this with the Altace or continue his home medication.  DISCHARGE MEDICATIONS: 1. Plavix 75 mg q.d. (x4 weeks). 2. Coated aspirin 81 mg q.d. 3. Lipitor 10 mg q.d. 4. Altace 2.5 mg b.i.d. 5. Toprol-XL 50 mg q.d. 6. Foltx one tablet q.d. 7. Nitrostat 0.4 mg as directed.  DISCHARGE INSTRUCTIONS:  Patient is  to refrain from any heavy lifting, strenuous activity, or driving for at least two days.  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  Patient is to maintain a low fat/cholesterol diet and to call our office if there is any swelling/bleeding in the groin.  FOLLOWUP:  Patient is scheduled to follow up with Dr. William Hamburger Gupta/P.A. clinic on Friday, July 02, 2001, at 11:15 a.m.  Patient is scheduled for GI evaluation with Dr. Laurence Spates on Friday, February 28th, at 11:10 a.m.   DISCHARGE DIAGNOSES: 1. Two-vessel coronary artery disease/normal left ventricular function.    a. Status post stent 80% circumflex, June 21, 2001.    b. Residual 90% ostial and mid right coronary artery. 2. Recurrent bright red blood per rectum.    a. History of hematochezia and hemorrhoids. 3. Hypertension. 4. Dyslipidemia. 5. History of tobacco. Dictated by:   Mannie Stabile, P.A. Attending Physician:  Christy Sartorius DD:  06/22/01 TD:  06/22/01 Job: [id number]

## 2010-09-27 NOTE — Discharge Summary (Signed)
[place]. [facility]  Patient:    [name], [name] Visit Number: [id number] MRN: [id number]          Service Type: MED Location: 2000 2013 01 Attending Physician:  [name] Dictated by:   Arn Medal, P.A. Admit Date:  12/01/2001 Discharge Date: 12/02/2001   CC:         Loretha Brasil. Lia Foyer, M.D. [facility]   Discharge Summary  DISCHARGE DIAGNOSES: 1. Chest pain, status post cardiac catheterization. 2. Hyperlipidemia. 3. Hypertension. 4. Prostate cancer. 5. Gastritis, Helicobacter pylori positive. 6. Anemia.  HOSPITAL COURSE:  The patient is a 76 year old male who presented to [facility] Emergency Room early in the morning of December 01, 2001 with complaining of left-sided chest pain, which was unassociated with of shortness of breath and diaphoresis.  He was seen by Dr. Bing Quarry.  Dr. Lia Foyer knows that the patient has undergone cardiac catheterization upon November 10, 2001, which included a PCI circumflex with additional brachytherapy.  He also noted that the patient reported bloody stools and questions whether this was secondary to the radiation therapy for prostate cancer.  The patients chest pain is somewhat atypical, Dr. Lia Foyer felt that the patient should be admitted and ruled out for MI with serial cardiac enzymes.  He deferred plans for further evaluation to Dr. Percival Spanish.  Later that morning, the patient was seen by Dr. Percival Spanish.  He felt that re-catheterization was indicated to assess for restenosis and planned to schedule this on the following day.  On December 02, 2001, the patient was taken to the cath lab by Dr. Allene Dillon. Catheterization revealed normal left ventricular end-diastolic pressure. He noted that the previously placed stent in left circumflex was patent. Other stenoses was essentially unchanged from the prior catheterization study. He recommended medical treatment.  Later that afternoon,  Dr. Vicenta Aly again saw the patient.  His groin was stable and after the appropriate rest time, he was felt to be stable for discharge.  His GI workup will be completed as an outpatient.  DISCHARGE MEDICATIONS: 1. Plavix 75 mg q.d. 2. Prevpac once q.d. until gone. 3. Niferex 150 mg b.i.d. 4. Altace 5 mg q.d. 5. Toprol-XL 25 mg (one half 50 mg tablet) q.d., of note, this was decreased    due to bradycardia. 6. Lipitor 10 mg q.h.s. 7. Enteric-coated aspirin 81 mg q.d. 8. Sublingual nitroglycerin as needed.  LABORATORY VALUES: White count 4.0, hemoglobin 9.6, hematocrit 31.4, and platelets 251.  Sodium 138, potassium 4.0, chloride 107, CO2 25, BUN 14, creatinine 1.1, and glucose 109.  Serial cardiac enzymes were negative for MI.  Chest x-ray on admission showed some stable cardiomegaly, but no active lung disease.  Electrocardiogram showed sinus bradycardia 51 with a borderline first-degree AV block.  PR interval 218, QRS 86, QTc 294, axis -18.  DISCHARGE INSTRUCTIONS:  The patient is to avoid driving, heavy lifting, or tub baths for two days.  DIET:  He is follow low fat, low-cholesterol diet.  DISCHARGE INSTRUCTIONS:  He is to watch the catheterization site any pain, bleeding, swelling and to call for mobile office any of these problems.  He is follow up with the PA visit and a groin check in the office on December 13, 2001 at 9:15 in the morning.  He is to follow up with Dr. Kenton Kingfisher as needed or scheduled. He is to contact his GI doctor for appointment. Dictated by:   Arn Medal, P.A. Attending Physician:  [name] DD:  12/03/99 TD:  12/08/99 Job: [id number]

## 2010-09-27 NOTE — Discharge Summary (Signed)
   NAMEDELMA, [name]                          ACCOUNT NO.:  [id number]   MEDICAL RECORD NO.:  [id number]                   PATIENT TYPE:  INP   LOCATION:  2013                                 FACILITY:  [facility]   PHYSICIAN:  Gilford Raid, M.D.                  DATE OF BIRTH:  [birth year]   DATE OF ADMISSION:  07/08/2002  DATE OF DISCHARGE:  07/21/2002                                 DISCHARGE SUMMARY   ADDENDUM:  The patient was originally scheduled to be discharged home on  July 19, 2002.  However, on his morning labs he was noted to be anemic with  hemoglobin of 7.5.  He had previously required transfusions in the early  part of his postoperative course and his hemoglobin had stabilized; however,  at this drop, it was felt that he would require an additional transfusion.  He remained in the hospital and received a unit of packed red blood cells  which brought his hemoglobin up to 8.8 with hematocrit of 28.  He was also  started on iron replacement therapy.  He remained stable and by July 21, 2002, was felt to be ready for discharge home.  There were no other changes  in his medication regimen, his orders or his instructions that occurred in  the interim.  All his other instructions are as previously dictated.     Suzzanne Cloud, P.A.                        Gilford Raid, M.D.    GC/MEDQ  D:  09/15/2002  T:  09/16/2002  Job:  [id number]   cc:   Thomas C. Wall, M.D.

## 2010-09-27 NOTE — Cardiovascular Report (Signed)
[facility]  Patient:    [name], [name] Visit Number: [id number] MRN: [id number]          Service Type: MED Location: 2000 2013 01 Attending Physician:  [name] Dictated by:   Allene Dillon, M.D. [facility] Proc. Date: 12/02/01 Admit Date:  12/01/2001 Discharge Date: 12/02/2001   CC:         Minette Brine, M.D.  [name], M.D. [facility]  Cardiac Catheterization Laboratory   Cardiac Catheterization  PROCEDURES PERFORMED: Left heart catheterization with coronary angiography.  INDICATIONS: The patient is a 76 year old male who underwent PTCA with brachytherapy for in-stent re-stenosis in the mid left circumflex approximately three weeks ago. At that time, prior to the procedure, Dr. Lia Foyer performed catheterization and the option of bypass surgery was discussed because of moderate disease in the LAD and significant disease in the small right coronary artery. The patient preferred not to have bypass surgery and after further consultation, percutaneous coronary intervention was performed. The patient represented to the hospital with recurrent substernal chest pain and was referred for cardiac catheterization.  DESCRIPTION OF PROCEDURE: A 6 French sheath was placed in the right femoral artery.  Catheters utilized included a 6 [place] JL5, JR4, and angled pigtail catheter. Left ventriculography was not performed as the patient had a complete diagnostic catheterization three weeks ago. Contrast was Omnipaque. There were no complications.  RESULTS:  HEMODYNAMICS: Left ventricular pressure 118/12.  Aortic pressure 134/66. There was no aortic valve gradient.  Left ventriculography was not performed as described above.  CORONARY ARTERIOGRAPHY: (Left dominant).  Left main has a distal 30% stenosis.  Left anterior descending artery has a 30% stenosis in the proximal vessel. The mid LAD has a 60% stenosis and the apical LAD has a 70%  stenosis. The LAD gives rise to normal sized branching first diagonal, small second diagonal and a small third diagonal.  Left circumflex is a dominant vessel. There is a stent in the mid circumflex which has 30-40% stenosis within the stent. Compared the immediate post angioplasty results three weeks ago, there has been some recoil at the stent site. However, it still remains patent. The circumflex gives rise to a small OM-1 which arises from within the stented portion of the vessel. There is a 95% stenosis at the origin of this first marginal branch. Just beyond the stent there is a large OM-2 which has a 30% stenosis at its origin. Following this is a small OM-3. The distal circumflex gives rise to a normal sized posterolateral branch followed by a small posterior descending artery.  Right coronary artery is a small nondominant vessel which does supply few small septal perforators to the inferior septum. The right coronary artery is diffusely diseased. There is a 90% stenosis at its ostium. In the mid vessel there is an 80% followed by a 95% stenosis. In the distal vessel there is an 80% stenosis. This is unchanged from previous catheterizations.  IMPRESSIONS: 1. Normal left ventricular end-diastolic pressure. 2. Patent stent in the left circumflex with mild recoil at the angioplasty    site, but with continued patency. 3. Moderate residual disease in the left anterior descending artery and    significant diffuse disease in a small nondominant coronary artery which    only supplies a few small septal perforator to the left ventricle.  RECOMMENDATIONS: Continued medical therapy. Dictated by:   Allene Dillon, M.D. [facility] [place] Attending Physician:  [name] DD:  12/02/01 TD:  12/06/01 Job: [id number]

## 2010-09-27 NOTE — Cardiovascular Report (Signed)
[place]. [facility]  Patient:    [name], [name] Visit Number: [id number] MRN: [id number]          Service Type: MED Location: [id number] Attending Physician:  [name] Dictated by:   Vanna Scotland Olevia Perches, M.D. [facility] Proc. Date: 11/11/01 Admit Date:  11/09/2001 Discharge Date: 11/15/2001   CC:         [name], M.D. [facility]  Minette Brine, M.D.  Loretha Brasil. Lia Foyer, M.D. [facility]  Cardiopulmonary Laboratory   Cardiac Catheterization  PROCEDURES PERFORMED: Percutaneous coronary intervention.  CLINICAL HISTORY: The patient is 76 years old and in February of this year had a stent placed to the circumflex artery. He had a 90% ostial stenosis and a small right coronary artery, which was treated medically, and he had moderate disease in the LAD, which was treated medically. He was readmitted with unstable angina and was studied yesterday by Dr. Lia Foyer. Surgery was recommended, but the patient was very reluctant to have surgery and asked for another opinion, which was given by Dr. Wynonia Lawman. After reviewing the films, Dr. Wynonia Lawman felt that he might be treated percutaneously and we discussed that option with him. We felt the right artery was quite small and was marginal for being large enough to be treated with bypass surgery. The LAD lesions were not felt to be critical and were felt to be in the 70% range. After discussing the pros and cons with the patient, he elected to have treatment with percutaneous coronary intervention. We plan to do this with brachytherapy.  DESCRIPTION OF PROCEDURE: The procedure was performed via the left femoral artery using an arterial sheath and 7 French 3.5 Voda guiding catheter with side holes. The patient was given Angiomax bolus in infusion and was given Ticlid at the beginning of the procedure, 300 mg. We were able to cross the in-stent re-stenosis in the mid circumflex artery with a wire without difficulty. We  initially dilated with a 3.5 x 10 mm Cutting Balloon and performed a total of five inflations up to 7 atmospheres for 67 seconds. We then removed the Cutting Balloon and positioned a 3.5 x 32 mm Galileo centering catheter. We then delivered 20 Gy of beta-radiation to the targeted area. Repeat pictures showed some slight deterioration of the appearance of the lesion, so we dilated again with the 3.5 x 10 mm Cutting Balloon performing two inflations of 7 atmospheres for 50 and 34 seconds. Repeat diagnostic studies were then performed through the guiding catheter. The patient tolerated the procedure well and left the laboratory in satisfactory condition.  RESULTS: Initially the stenosis in the mid circumflex artery was estimated at 90%. Following Cutting Balloon angioplasty and brachytherapy, this improved to 20%.  The lesion in the proximal LAD was estimated at 70%.  CONCLUSIONS: Successful Cutting Balloon angioplasty and brachytherapy for in-stent re-stenosis in the mid circumflex artery with improvement in percent diameter narrowing from 90% to 20%.  DISPOSITION: The patient was returned to the postangioplasty unit for further observation. Dictated by:   Vanna Scotland Olevia Perches, M.D. [place] Attending Physician:  [name] DD:  11/11/01 TD:  11/16/01 Job: [id number]

## 2010-09-27 NOTE — H&P (Signed)
[place]. [facility]  Patient:    [name], [name]                          MRN: [id number] Attending:  Janit Pagan., M.D.                         History and Physical  CHIEF COMPLAINT:  This patient is age 76 and scheduled on August 09, 1999 for implantation of radioactive seeds in his prostate for prostate cancer.  HISTORY OF PRESENT ILLNESS:  The patient has been followed in our office with prostatitis and BPH since 1994 and had some enlargement of his prostate at that  time. The initial PSAs were 3.2.  The patients PSA rose in 1998 to 6.2 and he had a benign biopsy at that time.  The patients PSAs continued to be elevated despite treatment of his prostatitis and in January 2001 it rose to 7.8 with 16% free fraction and his biopsy was repeated.  On this occasion he got a Gleason 3+3 in  both lobes of the prostate and we discussed surgery and radiation extensively with the patient and his wife.  He had consultation with Dr. Danny Lawless and decided to proceed with radioactive seed implantation. He was scheduled for ultrasound volume and the prostate was felt to be of normal size and satisfactory for radioactive  I125 seed implantation which will be performed on August 09, 1999.  ALLERGIES:  Minocin and possibly sulfa.  PRESENT MEDICATIONS: Univasc 15 mg daily.  Hydrochlorothiazide 25 mg daily.  He has been on Levaquin 500 mg preoperative and postoperative.  REVIEW OF SYSTEMS:  The patient has had some asthma in the past.  HEENT: Unremarkable.  Cardiorespiratory: He denies any heart attack, chest pain and his asthma has not been a problem recently. GI: He has had no hepatitis or peptic ulcer disease.  Bones, joints and muscles: He does have some arthritis. Neuropsychiatric:  No stroke or dizzy spells.  FAMILY HISTORY: Positive for prostate cancer in his father who died at age 70. e has four brothers living and well. One brother dead of cancer.   He has one sister living and well.  To his knowledge there is no hypertension, heart disease, diabetes or bleeders in the family.  SOCIAL HISTORY:  The patient is employed by a [employer] as a Geophysicist/field seismologist.  He is married.  He does not abuse alcohol or tobacco.  PHYSICAL EXAMINATION:  GENERAL:  Well-developed, well-nourished, 76 year old male.  VITAL SIGNS:  Blood pressure 161/97.  Pulse is 67. Respirations 16. Temperature 98.  HEENT:  Unremarkable.  Teeth are in fairly good condition.  HEART: Normal sinus rhythm.  No murmur detected.  CHEST:  Clear to percussion and auscultation.  ABDOMEN:  Mild to moderately obese.  No masses or tenderness.  GENITALIA:  The penis is normal and circumcised.  The testes are good size and symmetrical.  RECTAL:  The rectal tone is good.  The prostate is soft and slightly nodular and about 25 grams in size.  EXTREMITIES:  Free of edema with good pulses.  NEUROLOGIC: He is grossly normal and he has normal reflexes.  DIAGNOSES: 1. Adenocarcinoma of the prostate, Gleason 3+3, B-II. Prostatic specific antigen    7.8. 2. Hypertension, mild to moderate. 3. Prostatitis and benign prostatic hyperplasia dating to 1994 and benign    biopsy in 1998.  PLAN: I-125 seed implantation with Dr.  Goodchild and flexible cystoscopy. DD:  08/09/99 TD:  08/09/99 Job: [id number]

## 2010-09-27 NOTE — Cardiovascular Report (Signed)
[name]. [facility]  Patient:    [name], [name] Visit Number: [id number] MRN: [id number]          Service Type: MED Location: [id number] Attending Physician:  Christy Sartorius Dictated by:   [name], M.D. [facility] Proc. Date: 06/21/01 Admit Date:  06/17/2001   CC:         Biagio Borg, M.D.   Cardiac Catheterization  PROCEDURE:  Left heart catheterization/coronary arteriography.  CARDIOLOGIST:  [name], M.D. [facility]  INDICATION:  Evaluate patient with unstable angina.  PROCEDURAL NOTE:  Left heart catheterization was performed via the right femoral artery.  The artery was cannulated using anterior wall puncture.  A #6 French arterial sheath was inserted via the modified Seldinger technique. Preformed Judkins catheters were utilized.  A Judkins left 5 was utilized to cannulate the left main.  The right coronary was cannulated with a Judkins right and also with a guiding catheter with side hole.  Pigtail was utilized to inject the left ventricle.  The patient tolerated the procedure well. RESULTS:  HEMODYNAMICS:  LV: 136/19, AO: 137/74.  CORONARIES:  The left main had distal 25% stenosis.  Left anterior descending artery:  The LAD had proximal 25% stenosis with mid 50% stenosis.  Circumflex:  The circumflex was codominant.  There was ostial 30% stenosis. There was a mid 70% stenosis before the mid obtuse marginal.  (This may have been closer to 80% in some views.)  There was diffuse distal moderate nonobstructing plaquing in the posterolaterals.  Right coronary artery:  The right coronary artery was codominant but was a moderate size vessel at most.  It had an ostial 90% stenosis.  There was long diffuse mid 50% stenosis with focal 90% stenosis after the RV branch.  LEFT VENTRICULOGRAM:  A left ventriculogram was obtained in the RAO projection.  The EF was approximately 60%.  CONCLUSION:  Severe two-vessel coronary artery  disease.  The right coronary artery may be the culprit causing his resting pain; however, it is not amenable to percutaneous revascularization with any high probability of long-term patency.  The circumflex has a high-grade lesion supplying the similar distribution as the right coronary.  It is a codominant circumflex. The plan is to perform percutaneous revascularization of this vessel  PLAN:  Given the extent and diffuse nature of the disease, the best revascularization approach will be CABG.  This has been discussed with the patient. Dictated by:   [name], M.D. [place] Attending Physician:  Christy Sartorius DD:  06/21/01 TD:  06/21/01 Job: [id number]

## 2010-09-27 NOTE — Procedures (Signed)
[facility]  Patient:    [name], [name] Visit Number: [id number] MRN: [id number]          Service Type: END Location: ENDO Attending Physician:  Sherrin Daisy Proc. Date: 12/30/00 Adm. Date:  12/30/2000   CC:         Biagio Borg, M.D.   Procedure Report  PROCEDURE:  Colonoscopy.  MEDICATIONS:  Fentanyl 100 mcg, Versed 7 mg IV.  INDICATIONS FOR PROCEDURE:  Hematochezia in a gentleman with prostate cancer and probable radiation therapy.  DESCRIPTION OF PROCEDURE:  The procedure had been explained to the patient and consent obtained. With the patient in the left lateral decubitus position, the Olympus video colonoscope was inserted and advanced under direct visualization. The prep was quite good. We were able to advance to the cecum without difficulty. The scope was withdrawn and the cecum, ascending colon, hepatic flexure, transverse colon, splenic flexure, descending and sigmoid colon were seen well upon removal. No polyps or other lesions were seen and no significant diverticular disease.  The scope was withdrawn to the rectum. 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 There was no active bleeding. The scope was withdrawn. The patient tolerated the procedure well.  ASSESSMENT: 1. Large internal hemorrhoids. 2. 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  PLAN:  Will give the patient Canasa suppositories and will use one every night before bed. Will plan on seeing back in the office in the next 6-8 weeks. Attending Physician:  Sherrin Daisy DD:  12/30/00 TD:  12/30/00 Job: [id number]

## 2010-09-27 NOTE — Procedures (Signed)
[place]. [facility]  Patient:    CLYDELL, MAHANNAH Visit Number: [id number] MRN: [id number]          Service Type: MED Location: [id number] Attending Physician:  [name] Dictated by:   Elyse Jarvis Amedeo Plenty, M.D. Proc. Date: 11/13/01 Admit Date:  11/09/2001 Discharge Date: 11/15/2001   CC:         [name], M.D. [facility]   Procedure Report  PROCEDURE:  Esophagogastroduodenoscopy with biopsy.  INDICATION FOR PROCEDURE:  Heme-positive stools, anemia and chest pain.  DESCRIPTION OF PROCEDURE:  The patient was placed in the left lateral decubitus position then placed on the pulse monitor, with continuous low-flow oxygen delivered by nasal cannula.  He was sedated with 50 mg IV Demerol and 5 mg IV Versed.  The Olympus video endoscope was advanced under direct vision to the oropharynx and esophagus.  The esophagus was straight and of normal caliber.  The squamocolumnar line at 38 cm.  There is no visible hiatal hernia, ring, stricture or other abnormality to the GE junction.  The stomach was entered and a small amount of liquid secretions are suctioned from the fundus.  Retroflexed view of the cardia was unremarkable.  The fundus and body appeared normal.  The antrum showed a few mild discrete erythema and granulators.  No focal erosions.  A CLOtest was obtained.  The pylorus was nondeformed and easy to allow passage of the endoscope deep into the duodenum.  Both the bulb and second portion were well inspected, and appeared to be within normal limits.  The scope was withdrawn and the patient was returned to the recovery room in stable condition.  He tolerated the procedure well and there were no immediate complications.  IMPRESSION:  Antral gastritis, otherwise normal endoscopy.  PLAN:  Await CLOtest and treat for erradication if Helicobacter positive; otherwise treat with proton pump inhibitor. Dictated by:   Elyse Jarvis Amedeo Plenty, M.D. Attending Physician:   [name] DD:  11/13/01 TD:  11/16/01 Job: [id number]

## 2010-09-27 NOTE — Op Note (Signed)
. [facility]  Patient:    [name], [name]                       MRN: [id number] Proc. Date: 08/09/99 Adm. Date:  [id number] Attending:  Larene Beach CC:         Truddie Crumble, M.D.             Janit Pagan., M.D.                           Operative Report  PREOPERATIVE DIAGNOSES: 1. Adenocarcinoma of the prostate. 2. Hypertension.  POSTOPERATIVE DIAGNOSES: 1. Adenocarcinoma of the prostate. 2. Hypertension.  OPERATION PERFORMED:  Radioactive I-125 seed implantation to the prostate and flexible cystoscopy.  SURGEON:  Janit Pagan., M.D.  [place]:  This patient underwent successful induction of general anesthesia, was prepared with p.o. Levaquin and IV gentamicin 80 mg.  The patient was positioned in the lithotomy position and the prostate visualized with the ultrasound probe, as well as, C-arm fluoroscopy.  Fixation needles were placed n the prostate and then a series of needles with 96 seeds were inserted in a pattern that was satisfactory to Dr. Loraine Maple plan and was confirmed by C-arm fluoroscopy and ultrasound.  The target dose was 16 gray and patient had 96 seeds. These were placed with needles without complication.  The patients Foley catheter was then removed and his bladder examined with flexible cystoscope.  Flexible cystoscopy revealed no seeds in the urethra or in the bladder.  There as no stone or tumor noted in the bladder.  The patient did have elevated bladder eck due to median bar formation, but no true lateral lobe hypertrophy.  There was no urethral stricture.  There was only mild hyperemia and erythema of the bladder.  The Foley catheter was replaced and the patient returned to recovery area in a stable condition.  PLAN:  The patient is to go home with a Foley catheter and he will have Tylox for pain and cephalexin for prevention of infection.  He will return to the  office n Monday and will remove his Foley catheter. DD:  08/09/99 TD:  08/10/99 Job: [id number]

## 2010-09-27 NOTE — H&P (Signed)
[place]. [facility]  Patient:    [name], [name] Visit Number: [id number] MRN: [id number]          Service Type: MED Location: 2000 2013 01 Attending Physician:  [name] Dictated by:   Loretha Brasil. Lia Foyer, M.D. [facility] Admit Date:  12/01/2001 Discharge Date: 12/02/2001   CC:         Minette Brine, M.D.  [name], M.D. [facility]  John C. Amedeo Plenty, M.D.  James L. Oletta Lamas, M.D.   History and Physical  CHIEF COMPLAINT:  Chest pain.  HISTORY OF PRESENT ILLNESS:  [name] is a 76 year old gentleman who presents with some recurrent left chest pain.  The discomfort is somewhat episodic, at times brief.  The chest discomfort occurs in the left parasternal and inframammary area.  It is not definitely associated with shortness of breath or diaphoresis, but in many respects very similar to what he has had in the past when he presented and had percutaneous intervention.  On November 10, 2001 he underwent repeat cardiac catheterization which demonstrated a 90% ostial stenosis of the right coronary artery with mid lesions of 80 and 90% and this vessel was a small vessel.  He then had 30% narrowing of the left main and then a 70-80% lesion of the LAD.  There was a 90% stenosis of the circumflex at the previous site of stenting.  We initially got him to see Dr. Lilly Cove but the patient was not inclined to have surgery.  He wanted to have a second opinion which was obtained with Dr. Wynonia Lawman and then subsequently had a repeat balloon dilatation done by Dr. Olevia Perches accompanied by Durward Fortes therapy at the site of previous therapy.  He also underwent endoscopy which revealed minimal gastritis which was H. pylori positive.  In the last few days he has had evidence of blood in the stool which he says has been red.  He has had problems with radiation proctitis in the past.  ALLERGIES:  No known drug allergies.  PAST MEDICAL HISTORY: 1. Coronary  artery disease status post repeat PCI. 2. History of prostatectomy. 3. Gastritis, H. pylori positive. 4. Hyperlipidemia. 5. Hypertension. 6. History of prostate cancer.  MEDICATIONS: 1. Plavix 75 mg daily. 2. Prev-pac one q.d. x14 days. 3. Niferex 150 mg p.o. b.i.d. 4. Altace 5 mg q.d. 5. Toprol XL 50 mg q.d. 6. Lipitor 10 mg p.o. q.h.s. 7. Coated aspirin 81 mg daily. 8. Sublingual nitroglycerin p.r.n.  FAMILY HISTORY:  Mother died at 74.  She had had a leg amputation.  Father died at 17, history of prostate cancer.  He has one sister who is alive and well and four brothers, one of who has had stent placement and has a stomach problem.  SOCIAL HISTORY:  The patient drives a truck for [employer].  He lives with his wife x38 years.  They have three children.  REVIEW OF SYSTEMS:  Remarkable for radiation related proctitis and bleeding in the past.  PHYSICAL EXAMINATION  GENERAL:  He is an alert and oriented male in no acute distress.  VITAL SIGNS:  Temperature 97.1, blood pressure 133/71, pulse 92, respirations 20.  NECK:  The carotid upstrokes are brisk.  No carotid bruits.  LUNGS:  Fields clear to auscultation and percussion.  CARDIAC:  Rhythm is regular.  There is a soft [id number] systolic ejection murmur.  ABDOMEN:  Soft without obvious hepatosplenomegaly.  RECTAL:  I do not appreciate significant external hemorrhoids.  There  are multiple flecks of red stool superimposed on the stool.  EXTREMITIES:  No significant edema.  LABORATORIES:  EKG reveals sinus bradycardia with first degree AV block, rate 50.  Chest x-ray reveals no active disease, stable mild cardiac enlargement. The white count is 4300, hemoglobin 9.5, hematocrit 31, platelet 264,000. Sodium 138, potassium 4.0, chloride 107, CO2 25, BUN 14, creatinine 1.1, glucose 109.  IMPRESSION: 1. Recurrent substernal chest pain somewhat atypical as before, but with known    underlying coronary artery disease. 2. Known  coronary artery disease status post brachy therapy and repeat    percutaneous transluminal coronary angioplasty for instent restenosis of    the circumflex with known three vessel disease. 3. Heme-positive stools question secondary to prior radiation effects. 4. History of prostate cancer. 5. Helicobacter pylori gastritis. 6. Hypertension. 7. Hyperlipidemia.  PLAN:  We plan to admit the patient for serial cardiac enzymes.  We will hold heparin.  This is a somewhat complicated situation in that he has had brachy therapy and needs prolonged antiplatelet therapy.  Nonetheless, he also has blood in his stool.  His symptoms are not obviously characteristic for ischemic pain, but very similar to what he has had in the past so this certainly could not be excluded.  I will get Dr. Percival Spanish to see the patient and decide on further management plans. Dictated by:   Loretha Brasil Lia Foyer, M.D. [place] Attending Physician:  [name] DD:  12/01/01 TD:  12/04/01 Job: [id number]

## 2010-09-27 NOTE — Op Note (Signed)
   NAMETHRISTAN, DOWNIE                          ACCOUNT NO.:  [id number]   MEDICAL RECORD NO.:  [id number]                   PATIENT TYPE:  INP   LOCATION:  2017                                 FACILITY:  [facility]   PHYSICIAN:  Jeneen Rinks L. Rolla Flatten., M.D.          DATE OF BIRTH:  27-Dec-1934   DATE OF PROCEDURE:  DATE OF DISCHARGE:  04/20/2002                                 OPERATIVE REPORT   PROCEDURE:  Colonoscopy.   MEDICATIONS:  Fentanyl 30 mcg, Versed 3 mg IV.   ENDOSCOPE:  A pediatric colonoscope.   INDICATIONS:  The patient is a gentleman who continues to be admitted for  anemia with resulting chest pain. He has intermittent rectal bleeding. He  has had previous radiation. A colonoscopy in the past had shown mild  radiation proctitis only. Due to his continued rectal bleeding and the fact  that he has not has his colon evaluated for some time, a colonoscopy was  performed.   DESCRIPTION OF PROCEDURE:  The procedure had been explained to the patient  and consent was obtained. The patient was placed in the left lateral  decubitus position.  The Olympus pediatric adjustable colonoscope was  inserted and advanced under direct visualization.   The prep was excellent. We were able to reach the cecum without difficulty.  The ileocecal valve and appendiceal  orifice were seen. The scope was  withdrawal. The cecum and ascending colon, transverse and descending colon  were all unremarkable. Scattered diverticulosis in the sigmoid colon. No  polyps or other lesions.   The scope was withdrawn to the rectum, and in the very  distal rectum was  some very mild radiation proctitis. There were very large internal  hemorrhoids seen on the retroflex view.   The scope was withdrawn. The patient tolerated the procedure well. He was  maintained on low flow oxygen and pulse oximetry.   ASSESSMENT:  1. Internal hemorrhoids, probably the source of his bleeding.  2. Mild radiation  proctitis.    PLAN:  1. Questionable canasta suppositories for radiation. Can consider Erbe if     that does not help.  2. Will give MiraLax empirically.  3. Will suggest surgical consultation for banding injection therapy for     hemorrhoids.                                               [name] Flatten., M.D.    Jaynie Bream  D:  04/20/2002  T:  04/20/2002  Job:  [id number]   cc:   [name], M.D. [facility]  [address]  [place] [address]  Fax: 1   Azalia Bilis, M.D.  [address] [place], [place] [address]  Fax: [phone]

## 2010-09-27 NOTE — Discharge Summary (Signed)
[place]. [facility]  Patient:    [name], [name] Visit Number: [id number] MRN: [id number]          Service Type: MED Location: 2000 2013 01 Attending Physician:  [name] Dictated by:   Arn Medal, P.A. Admit Date:  12/01/2001 Discharge Date: 12/02/2001   CC:         Minette Brine, M.D.  Loretha Brasil. Lia Foyer, M.D. [facility]   Discharge Summary  DISCHARGE DIAGNOSES: 1. Chest pain, status post cardiac catheterization. 2. Hyperlipidemia. 3. Hypertension. 4. Prostate cancer. 5. Gastritis, Helicobacter pylori positive. 6. Anemia.  HOSPITAL COURSE:  The patient is a 76 year old male who presented to [facility] Emergency Room early in the morning of December 01, 2001 with complaining of left-sided chest pain, which was unassociated with of shortness of breath and diaphoresis.  He was seen by Dr. Bing Quarry.  Dr. Lia Foyer knows that the patient has undergone cardiac catheterization upon November 10, 2001, which included a PCI circumflex with additional brachytherapy.  He also noted that the patient reported bloody stools and questions whether this was secondary to the radiation therapy for prostate cancer.  The patients chest pain is somewhat atypical, Dr. Lia Foyer felt that the patient should be admitted and ruled out for MI with serial cardiac enzymes.  He deferred plans for further evaluation to Dr. Percival Spanish.  On that morning, the patient was seen by Dr. Percival Spanish.  He felt that re-catheterization was indicated to assess for restenosis and planned to schedule this on the following day.  On December 02, 2001, the patient was taken to cath lab by Dr. Allene Dillon.  Catheterization revealed normal left ventricular end-diastolic pressure.  He noted that the previously placed stent in the left circumflex was patent.  This stenosis was essentially unchanged from the prior catheterization study.  He recommended medical  treatment.  Late in the afternoon, after Dr. Vicenta Aly again saw the patient.  His groin was stable and after the appropriate rest time he is felt to be stable for discharge.  His GI workup will be completed as an outpatient.  DISCHARGE MEDICATIONS: 1. Plavix 75 mg q.d. 2. Prevpac once q.d. until gone. 3. Niferex 150 mg b.i.d. 4. Altace 5 mg q.d. 5. Toprol-XL 25 mg (one half 50 mg tablet) q.d., of note, this was decreased    due to bradycardia. 6. Lipitor 10 mg q.h.s. 7. Enteric-coated aspirin 81 mg q.d. 8. Sublingual nitroglycerin as needed.  LABORATORY VALUES: White count 4.0, hemoglobin 9.6, hematocrit 31.4, and platelets 251.  Sodium 138, potassium 4.0, chloride 107, CO2 25, BUN 14, creatinine 1.1, and glucose 109.  Serial cardiac enzymes were negative for MI.  Chest x-ray on admission showed some stable cardiomegaly, but no active lung disease.  Electrocardiogram showed sinus bradycardia at 51 with a borderline first-degree AV block.  PR interval 218, QRS 86, QTc 394, axis -18.  DISCHARGE INSTRUCTIONS: 1. The patient is to avoid driving, heavy lifting, or tub baths for two days. 2. He is follow a low fat, low-cholesterol diet. 3. He is to watch the cath site any pain, bleeding, or swelling and to call    the [place] office for any of these problems. 4. He is follow up with the PA visit and a groin check in the office on 12/13/01    at 9:15 in the morning. 5. He is to follow up with Dr. Kenton Kingfisher as needed or scheduled. 6. He is to contact his GI doctor  for an appointment. Dictated by:   Arn Medal, P.A. Attending Physician:  [name] DD:  12/03/99 TD:  12/08/99 Job: [id number]

## 2010-09-27 NOTE — H&P (Signed)
[place]. [facility]  Patient:    MERT, SUGIMOTO Visit Number: [id number] MRN: [id number]          Service Type: MED Location: [id number] Attending Physician:  Lajean Saver Dictated by:   Christy Sartorius, M.D. [facility] Admit Date:  06/17/2001                           History and Physical  HISTORY:  Mr. Wolbert is a 76 year old black male who presents with substernal chest discomfort.  The patient has a history of dislipidemia and hypertension but no prior history of coronary artery disease.  He has no prior history of chest pain.  Today, during work, he experienced left substernal pain while driving.  He felt somewhat sore in his chest and he had recently been moving some boxes but denied any significant strenuous activity.  He had no nausea, vomiting, diaphoresis, or shortness of breath.  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  Currently, he has some mild discomfort, overall improved.  He did receive some baby aspirin en route and has recently been placed on a nitro drip with some mitigation in pain.  He denies any lower extremity edema, orthopnea, paroxysmal nocturnal dyspnea.  No exertional symptoms, syncope, presyncope.  Review of symptoms otherwise noncontributory.  PAST MEDICAL HISTORY:  Notable for hypertension, dislipidemia, mild obesity, remote tobacco use, prostate cancer status post resection.  ALLERGIES:  None.  CURRENT MEDICATIONS:  Antihypertensives as well as vitamins.  Patient does not have his medicines with him.  SOCIAL HISTORY:  He is married, lives in [place], has three children.  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  Remote tobacco use of one-half pack per day for 20 years, quit 20 years ago.  Remote alcohol use as well.  FAMILY HISTORY:  Father died of prostate  cancer.  Mother - unsure medical history.  One brother deceased age 58 of cancer.  PHYSICAL EXAMINATION:  GENERAL:  He is a well-developed, well-nourished black male in no acute distress.  VITAL SIGNS:  Blood pressure 163/81, pulse 77, respirations 18.  He is afebrile.  HEENT:  Pupils are equal, round and reactive to light.  Extraocular muscles are intact.  NECK:  Soft.  No carotid bruits, no adenopathy, no jugular venous distention.  HEART:  Reveals a regular rate without murmurs.  LUNGS:  Clear to auscultation.  ABDOMEN:  Soft, nontender.  EXTREMITIES:  Peripheral pulses are 2+ and equal bilaterally.  No peripheral edema.  Motor strength is 5/5.  Sensory is intact to touch.  LABORATORY DATA:  ECG shows normal sinus at 68.  There is nonspecific ST change in the inferolateral leads.  No acute ST segment elevation or depression.  Chest x-ray shows no acute disease.  Laboratory:  Troponin I 0.02, CK 490.  Creatinine 1.4.  Hemoglobin 13.5, hematocrit 41.  INR 0.9.  ASSESSMENT AND PLAN:  Mr. Schnack is a 76 year old gentleman who presents with substernal chest discomfort.  He has significant cardiac risk factors and had nonspecific changes on his ECG.  The patient will be admitted to telemetry, will obtain serial cardiac enzymes to rule out acute myocardial infarction. Anticoagulation will be initiated and we will also control the patients blood pressure with IV nitroglycerin, ACE inhibitor, beta blocker as tolerated.  There is significant pretest probability of coronary disease.  We have recommended cardiac catheterization to define his anatomy.  The patient understands and wishes to proceed.  This will be arranged for him per the catheterization schedule on elective basis.  We will proceed urgently should he have recurrent chest discomfort or evidence of injury.  Will also obtain a fasting lipid profile and I have asked the patient to bring his medications to optimize his  antihypertensives.Dictated by:   Christy Sartorius, M.D. [place]  Attending Physician:  Lajean Saver DD:  06/17/01 TD:  06/17/01 Job: [id number]

## 2010-09-27 NOTE — Op Note (Signed)
[place]. [facility]  Patient:    RICKO, MACDONELL Visit Number: [id number] MRN: [id number]          Service Type: MED Location: [id number] Attending Physician:  Christy Sartorius Dictated by:   Vanna Scotland Olevia Perches, M.D. [facility] Proc. Date: 06/21/01 Admit Date:  06/17/2001   CC:         Cardiac Catheterization Laboratory  Nikki Dom, M.D., [facility]  Christy Sartorius, M.D. [facility]  [name], M.D. [facility]   Operative Report  PROCEDURE:  Percutaneous coronary intervention.  CARDIOLOGISTVanna Scotland Olevia Perches, M.D.  INDICATIONS:  Mr. Patchett is a 75 year old employee of [employer].  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  He underwent a cardiac catheterization today by Dr. [name] and was found to have a tight ostial lesion in a very small right coronary artery as well as a lesion in the midportion of that vessel.  He also had an 80% lesion in the midportion of the circumflex coronary artery, which was a much larger vessel.  The right coronary artery was not favorable for intervention, but the circumflex was, and we felt the best option was to treat the circumflex with a percutaneous coronary intervention, and the rest of his disease medically.  DESCRIPTION OF PROCEDURE:  The procedure was performed via the right femoral artery using an arterial sheath and a 7-French 3.5 Voda guiding catheter.  The patient was given weight-adjusted heparin to prolong the ACT to greater than 200 seconds, and was given a double bolus of Integrilin infusion.  We crossed the lesion with a short loose wire without difficulty.  We direct-stented with a 3.0 mm x 12.0 mm Express, deploying this with one inflation of 16 atmospheres for 30 seconds.  We then post-dilated with a 3.5 mm x 12.0 mm Quantum Maverick, performing two inflations of  10 atmospheres for 30 seconds. Repeat diagnostic studies were then performed with the guiding catheter.  The patient tolerated the procedure well and left the laboratory in satisfactory condition.  RESULTS:  Initially the stenosis in the midcircumflex coronary artery was estimated at 80%.  Following stenting it was improved to less than 10%.  There was no dissection seen.  CONCLUSION:  Successful stenting of the circumflex stenosis, with improvement in percent diameter narrowing of the midcircumflex stenosis from 80% to less than 10%.  DISPOSITION:  The patient went to the post-angioplasty care unit for further observation. Dictated by:   Vanna Scotland Olevia Perches, M.D. [place] Attending Physician:  Christy Sartorius DD:  06/21/01 TD:  06/21/01 Job: [id number]

## 2010-09-27 NOTE — Cardiovascular Report (Signed)
[place]. [facility]  Patient:    [name], [name] Visit Number: [id number] MRN: [id number]          Service Type: MED Location: [id number] Attending Physician:  [name] Dictated by:   Loretha Brasil. Lia Foyer, M.D. [facility] Proc. Date: 11/10/01 Admit Date:  11/09/2001   CC:         [name], M.D. [facility]  Minette Brine, M.D.   Cardiac Catheterization  INDICATIONS:  Mr. Mcpheeters is a 76 year old gentleman who previously underwent stenting of the circumflex coronary artery.  At that time, he had severe disease of the right coronary artery.  He has developed unstable angina and presented now for recurrent catheterization.  He was seen by Dr. Percival Spanish and set up.  PROCEDURE: 1. Left heart catheterization. 2. Selective coronary angiography. 3. Selective left ventriculography. 4. Subclavian angiography.  DESCRIPTION OF PROCEDURE:  The procedure was performed from the right femoral artery using 6 French catheters.  He tolerated the procedure well and there were no complications.  He was taken to the holding area in satisfactory clinical condition.  HEMODYNAMIC DATA: 1. Central aorta 161/76. 2. Left ventricle 143/22. 3. No gradient on pullback across the aortic valve.  ANGIOGRAPHIC DATA: 1. Ventriculography in the RAO projection reveals preserved global systolic    function, no segmental abnormalities or contraction were identified. 2. The subclavian appeared to be widely patent as is the internal mammary. 3. The left main coronary artery is diffusely calcified with about 30%    segmental disease throughout. 4. The left anterior descending artery has about 40% narrowing after the    takeoff of the optional diagonal.  In the midvessel there is about an    80% area of hazy stenosis.  Whether this represents a ruptured plaque or    calcified stenosis is unknown.  After the diagonal, there is about a 60%    area of focal narrowing. 5. The ramus  intermedius has some diffuse luminal irregularity, but is    without critical disease. 6. The circumflex is a large vessel with about 50% proximal narrowing. There    is 95% in-stent restenosis extending along a moderately large area. There    is a subtotal occlusion of a small marginal.  The in-stent restenosis leads    into a large marginal that has about 70% narrowing at the ostium.  The AV    circumflex has multiple areas of luminal irregularity, but no critical    stenosis. 7. The right coronary artery is diffusely diseased.  There is 90% ostial    narrowing and 80% mid narrowing, and 90% mid narrowing.  The distal vessel    is small.  CONCLUSION: 1. Preserved left ventricular function. 2. Significant three vessel coronary artery disease.  DISPOSITION:  The patient could be offered a repeat dilatation of the circumflex stent as well as radiation therapy and this would also require stenting of the left anterior descending artery.  However, ideally, I think that he would most likely benefit from revascularization surgery.  I will discuss this with the patient and ultimately also with Dr. Percival Spanish. Dictated by:   Loretha Brasil Lia Foyer, M.D. [place] Attending Physician:  [name] DD:  11/10/01 TD:  11/14/01 Job: [id number]

## 2010-09-27 NOTE — H&P (Signed)
[place]. [facility]  Patient:    [name], [name].                      MRN: [id number] Adm. Date:  08/09/99 Attending:  Janit Pagan., M.D. CC:         Truddie Crumble, M.D.             Janit Pagan., M.D.                         History and Physical  CHIEF COMPLAINT:  This patient, age 76, is scheduled 09 August 1999 for radioactive I-125 seed implantation at [facility] with a chief complaint of prostate cancer.  HISTORY OF PRESENT ILLNESS:  This 76 year old male has been followed in our office since 1994 when he was initially seen for prostatitis.  His PSA at that time was 3.2.  He has been seen at irregular intervals and was noted in 1998 to have an elevated PSA of 6.2 at which time he had a benign biopsy.  He was followed closely thereafter.  His PSAs continued to be elevated and rose to 7.8, and he had a repeat biopsy carried out in January 2001 and this revealed a Gleason 3+3 adenocarcinoma in both the right and left lobes of the prostate.  The patient was seen in consultation by Dr. Danny Lawless.  He was recommended for I-125 seed implantation since he was not interested in radical prostatectomy.  The patient had transrectal imaging of his prostate.  It was estimated at 24% cc volume and felt to be satisfactory for seed implantation, which has been scheduled for 09 August 1999.  ALLERGIES:  MINOCIN and SULFA.  MEDICATIONS:  Present medications include a blood pressure medicine, he does not know the name of.  He will get Levaquin prior to his seed implantation.  He was  given gentamicin and Cipro following his biopsies.  PAST HISTORY:  He has had no major operations.  He has had pneumonia on two occasions.  He has a remote history of asthma.  He did have an EKG July 09, 1999, which suggested the possibility of an old inferior infarction and some LVH voltage criteria.  REVIEW OF SYSTEMS:  General  health has been good.  His weight has been stable.  HEENT unremarkable except for the remote history of asthma and pneumonia. Cardiorespiratory:  He denies any chest pain, heart attack, feet do not swell. GI: No hepatitis.  No peptic ulcer disease.  Bowels move normally.  He was investigated once for a little rectal bleeding, by Dr. Lucia Gaskins.  Bones, joints, muscles:  He as had some arthritis of his hands.  Neuropsychiatric:  No stroke, fainting or falling out spells.  FAMILY HISTORY:  Father died at age 3 of prostate cancer.  He has four brothers living and well, one dead.  One sister.  His mother died at age 49, cause unknown. He does have three children.  To his knowledge, there is no diabetes, free bleeders, kidney, renal, or heart disease in the family.  There is a question of hypertension, and, of course, there is the cancer in his father and one brother.  SOCIAL HISTORY:  The patient works and is a Geophysicist/field seismologist for [employer], and he is married.  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  He does not abuse alcohol or tobacco.  PHYSICAL EXAMINATION:  GENERAL:  Well-developed, well-nourished,  76 year old male.  VITAL SIGNS:  Temperature 98, pulse 66, respirations 16, blood pressure 134/77.  HEENT:  The ears and tympanic membranes are unremarkable.  Eyes react normally o light and accommodation.  Extraocular movements intact.  Pharynx benign.  Teeth in fair condition.  NECK:  No enlargement of thyroid, or nodes palpable.  CHEST:  Clear to percussion and auscultation.  HEART:  Normal sinus rhythm.  No murmur detected.  ABDOMEN:  Moderately obese.  No masses or tenderness.  GENITALIA:  Penis circumcised.  Testes are fairly good size and symmetrical.  RECTAL:  Rectal tone is good.  The prostate is flat, broad, firm, slightly nodular, 20-25 gm in size, but nontender.  EXTREMITIES:  No edema.  Good peripheral pulses.  NEUROLOGIC:  Grossly normal reflexes and  sensation.  IMPRESSION:  1. Adenocarcinoma of the prostate, Gleason 3+3, PSA 7.8 diagnosed 22 May 1999.  2. Prostatitis.  3. Hypertension.  4. Electrocardiogram suggesting a possible old myocardial infarction.  5. Remote history of asthma.  PLAN:  I-125 seed implantation scheduled 09 August 1999 at [facility]. DD:  07/24/99 TD:  07/24/99 Job: [id number]

## 2010-09-27 NOTE — H&P (Signed)
Corey, Rivers                          ACCOUNT NO.:  [id number]   MEDICAL RECORD NO.:  [id number]                   PATIENT TYPE:  EMS   LOCATION:  [facility]                                 FACILITY:  [facility]   PHYSICIAN:  Marijo Conception. Wall, M.D. [facility]            DATE OF BIRTH:  06/15/1934   DATE OF ADMISSION:  07/08/2002  DATE OF DISCHARGE:                                HISTORY & PHYSICAL   CHIEF COMPLAINT:  Chest burning with exertion since Monday.   HISTORY OF PRESENT ILLNESS:  This patient is a 76 year old married African-  American male who is well known to our practice.  He has coronary artery  disease and had a stent in the dominant circ in February of 2003 with  subsequent restenosis requiring cutting balloon and brachial therapy in July  2003.  He was admitted with chest pain in December of 2003, but had negative  enzymes.  Cardiolite in August of 1903 had been negative for ischemia.   He is now presenting with exertional angina and dyspnea.  He had significant  pain this morning. He called me about 8:30, he had been to the doctor today,  and asked him to come on over.  He did not arrive to the emergency room  until early this afternoon.  He said that he had to run a few errands!   PAST MEDICAL HISTORY:  His past medical history is significant for radiation  proctitis, hyperlipidemia, hypertension, prostate cancer with radiation  therapy, hemorrhoids, left internal carotid artery stenosis 50% lesion,  coronary artery disease, and anemia.   ALLERGIES:  He has no known drug allergies.  He is intolerant of Crestor  with itching.   CURRENT MEDICATIONS:  1. Toprol XL 50 mg.  2. Altace 500 mg a day.  3. Protonix 40 mg a day.  4. Baby aspirin 81 mg a day.   SOCIAL HISTORY:  He lives in [place] with his wife.  He is a fellow  express truck driver.  He has 3 children. He does not smoke.  Does not  drink.   FAMILY HISTORY:  Significant for a brother who has coronary  disease.   REVIEW OF SYSTEMS:  Unremarkable.   PHYSICAL EXAMINATION:  GENERAL:  He is in no acute distress.  SKIN:  Warm and dry.  VITAL SIGNS:  Blood pressure 133/69, his pulse 68 and regular, his  respiratory rate is 20.  His temperature is 98.1.  He has a 98% saturation  on room air.  HEENT:  His HEENT exam is unremarkable.  NECK:  Neck shows no JVD.  Carotid upstrokes were full bilaterally without  bruits.  There is no JVD.  There is no lymphadenopathy.  CARDIOVASCULAR:  Exam reveals a regular rate and rhythm without murmur, rub,  or gallop.  LUNGS:  Lungs were clear.  SKIN:  Skin shows no rash or lesions.  ABDOMEN:  Abdominal exam  is soft with good bowel sounds.  No  hepatosplenomegaly.  EXTREMITIES:  His extremities show no clubbing, cyanosis, or edema.  NEUROLOGIC:  Neurologic exam is intact.   X-RAY AND LABORATORY DATA:  Chest x-ray shows right basilar atelectasis.  There is no heart failure.  First EKG is sinus at a rate of 66.  He has  significant ST segment depression of about 2 mm which is more than baseline  with T wave inversion in II, III, and AVF and he has new ST segment  depression and T wave changes in 5 and 6.  Initial enzymes are negative.  Coags are negative.  Creatinine is 1.1; potassium is 3.9.  Hemoglobin is 12.  Platelets are 272.   ASSESSMENT AND PLAN:  1. Crescendo angina with new EKG changes.  He has known coronary artery     disease and has had complicated circ intervention even with brachytherapy     as recent as July of 2003. I suspect restenosis of his dominant vessel.  2. Hyperlipidemia.  3. Hypertension.  4. History of anemia.  5. History of prostate CA status post radiation therapy.  6. History of radiation proctitis.  7. Hemorrhoids.   PLAN:  Intravenous heparin and nitro, presently; he is pain free.  We have  recommended cardiac catheterization this afternoon, being Friday.  He agrees  to proceed.                                                Thomas C. Verl Blalock, M.D. [facility]    TCW/MEDQ  D:  07/08/2002  T:  07/08/2002  Job:  [id number]   cc:   Dr. Fuller Plan

## 2010-09-27 NOTE — Cardiovascular Report (Signed)
[name], [name]                          ACCOUNT NO.:  [id number]   MEDICAL RECORD NO.:  [id number]                   PATIENT TYPE:  INP   LOCATION:  4704                                 FACILITY:  [facility]   PHYSICIAN:  Jenkins Rouge, M.D. [facility]              DATE OF BIRTH:  05/15/1934   DATE OF PROCEDURE:  07/08/2002  DATE OF DISCHARGE:                              CARDIAC CATHETERIZATION   PROCEDURE:  Coronary arteriography.   CARDIOLOGIST:  Jenkins Rouge, M.D.   INDICATIONS:  The patient was brought from the emergency room semi-urgently  for recurrent chest pain and EKG changes.  The patient has a history of  stenting of the circumflex coronary artery on June 21, 2001.  He  subsequently had cutting balloon therapy and brachiotherapy on November 11, 2001.  Both of these procedures were performed by Dr. Olevia Perches.  He had repeat  catheterization on December 02, 2001, and Dr. Vicenta Aly felt at that time that  medical therapy was warranted.   DESCRIPTION OF PROCEDURE:  Catheterization was done from the right femoral  artery.   FINDINGS:  The left main coronary artery had calcification with a 30%  discrete stenosis.   The left anterior descending coronary artery had a 70% discrete lesion in  the proximal and mid portion.  the distal vessel had 20-30% multiple  discrete lesions.  There was a small diffusely diseased first diagonal  branch.   The circumflex coronary artery was left dominant.   There were 20% multiple discrete lesions proximally.  The mid vessel had an  80% in-stent restenosis.  This included the take-off of a large second  obtuse marginal branch.  There was a smaller obtuse marginal branch that  emanated from the stent which was also 80% stenosed.   The distal AV groove branch had 30-40% multiple discrete lesions.   The right coronary artery was medium size but non-dominant.  The patient had  been bradycardiac with injections down to 44.  We could not engage the  right  coronary artery without catheter damping.  A 2-3 mL injection with catheter  engagement during injections showed an 80% ostial lesion and 80% tubular  disease in the mid vessel.   VENTRICULOGRAPHY:  The RAO ventriculography was normal.  Ejection fraction  was 65%.  There was no gradient across the aortic valve and no MR.   PRESSURES:  Aortic pressure was 157/74.  The LV pressure was 154/19.   IMPRESSION:  I will review the films with Dr. Lyndel Safe and Dr. Albertine Patricia.  However, the patient has had another restenosis in his circumflex coronary  artery even after cutting balloon and brachiotherapy.  He does have disease  in his LAD and a nondominant right coronary artery.  I think that serious  consideration should be given to open heart surgery, apparently after his  catheterization on November 10, 2001, Dr. Lia Foyer had favored coronary artery  bypass  grafting but the patient preferred percutaneous intervention.   The films again will be reviewed with Dr. Albertine Patricia and Dr. Lyndel Safe and we will  see if we can come up with an option.   Currently the patient has normal TIMI flow throughout his coronary system.                                               Jenkins Rouge, M.D. [facility]    PN/MEDQ  D:  07/08/2002  T:  07/09/2002  Job:  [id number]

## 2010-09-27 NOTE — Consult Note (Signed)
[name], [name]                          ACCOUNT NO.:  [id number]   MEDICAL RECORD NO.:  [id number]                   PATIENT TYPE:  INP   LOCATION:  2017                                 FACILITY:  [facility]   PHYSICIAN:  Jeneen Rinks L. Rivers Flatten., M.D.          DATE OF BIRTH:  [birth year]   DATE OF CONSULTATION:  DATE OF DISCHARGE:                                   CONSULTATION   REASON FOR CONSULTATION:  Anemia.   HISTORY OF PRESENT ILLNESS:  This 76 year old gentleman who I have seen in  the past had rectal bleeding and was referred for further evaluation of this  back in 2002.  At that time, he had a colonoscopy showing large internal  hemorrhoids and radiation proctitis.  It was felt to be somewhat mild.  There was no active bleeding.  The patient had previous seed implant and  radiation on the prostate.  He was treated with 5ASA suppositories and only  had one followup visit.  He was readmitted with heme positive stool, chest  pain and anemia.  In 7/03, he had an upper endoscopy by Dr. Amedeo Plenty that  showed antral gastritis, otherwise was negative.  The patient has had chest  pain for several days requiring his readmission here today.   The patient notes that he continues to have problems with bleeding, not with  every bowel movement but at least once or twice a week, always bright red  blood associated with bowel movements.  He has not been particularly  constipated.  He sees bright blood on the outside of his stool and with  wiping never any melena.  Sometimes, his stools are completely devoid of any  bright red blood.  He apparently had a rectal exam by the PA in the  emergency room that did not show any blood today.   He was being admitted with a rule out MI protocol.   MEDICATIONS:  On admission, Lipitor, Toprol XL 50 mg daily, Protonix 40  daily, aspirin daily, Plavix daily, Altace b.i.d.   ALLERGIES:  No medications, but IVP dye.   PAST MEDICAL HISTORY:  1. History of  prostate cancer, seen by Dr. Carin Primrose.  Had external beam     radiation and apparently seed implants approximately two years ago.  2. Coronary artery disease with a 60% ejection fracture and history of a     circumflex angioplasty and stent.  3. History of peptic ulcer disease.  4. History of carotid stenosis.   PAST SURGICAL HISTORY:  No previous abdominal surgeries other than some sort  of procedure on a prostate that I think was probably a TURP.  His wife  denies that he has had a total prostatectomy.   FAMILY HISTORY:  No colon cancer in the family.  There is coronary disease.   SOCIAL HISTORY:  He is married, works as a Administrator for [employer].  Still actively working  and able to drive a truck loaded.  Denies alcohol or  cigarette use in many years.   REVIEW OF SYSTEMS:  Remarkable primarily for the bright red blood with bowel  movements.  No history of melena and no abdominal pain.   PHYSICAL EXAMINATION:  Pulse 59, blood pressure 138/62.   GENERAL APPEARANCE:  Alert black male in no obvious distress.   HEENT:  Eyes:  Sclerae nonicteric.   HEART:  Regular rate and rhythm without murmurs or gallops.   LUNGS:  Clear.   ABDOMEN:  Soft, nontender, nondistended.  Stools was not repeated but  reported negative by the ER staff.   ASSESSMENT:  Anemia probably due to chronic GI bleeding due to hemorrhoids  and radiation proctitis.  She does bleed frequently according to the  patient's wife.   PLAN:  I am going to follow him along with you, and at some point in time  when he is stable from a cardiac viewpoint, he will likely need a  colonoscopy.                                               [name] Flatten., M.D.    [name]  D:  04/18/2002  T:  04/18/2002  Job:  [id number]   cc:   [name], M.D. [facility]  [address]  [place] [address]  Fax: 1   Azalia Bilis, M.D.  [address] [place], [place] [address]  Fax: [phone]

## 2010-09-27 NOTE — H&P (Signed)
[place]  Patient:    [name], [name] Visit Number: [id number] MRN: [id number]          Service Type: MED Location: [id number] Attending Physician:  [name] Dictated by:   [name], M.D. [facility] Admit Date:  11/09/2001   CC:         Minette Brine, M.D.   History and Physical  REASON FOR ADMISSION:  Evaluate a patient with chest pain.  HISTORY OF PRESENT ILLNESS:  The patient is a very pleasant 76 year old gentleman with a history of coronary artery disease as described below.  He has been doing relatively well.  He does get occasional chest pain, and takes nitroglycerin sporadically.  He was seen in the office most recently in March, for evaluation of this and had a stress perfusion study which demonstrated an ejection fraction of 56%.  There was no evidence of ischemia or infarction. Today he had his usual chest pain which is a left-sided fleeting discomfort; however, what bothered him was that he developed left neck numbness.  He thought that this was similar to the complaints that he had had in February that prompted a cardiac catheterization and angioplasty.  He denied any associated symptoms such as nausea, vomiting, or diaphoresis.  He has had no palpitations, presyncope, or syncope.  He denies PND or orthopnea.  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 He says he is not able to bring on similar symptoms, although he does get a heavy burning if he exerts himself significantly.  PAST MEDICAL HISTORY 1. Coronary artery disease (left main 25% stenosis, LAD 25% proximal and    50% midstenosis, circumflex codominant with ostial 30% stenosis,    mid-70%-80% stenosis, right coronary artery was a small codominant    vessel with an ostial 90% stenosis, followed by diffuse mid-50% stenosis    and a focal 90% stenosis after the right ventricular branch.  The    patient had stenting  of the circumflex lesion to less than 10% residual.    The ejection fraction was 60%). 2. Mild hyperlipidemia. 3. Hypertension. 4. Prostate cancer.  PAST SURGICAL HISTORY:  Prostatectomy 20 years ago.  ALLERGIES:  No known drug allergies.  CURRENT MEDICATIONS 1. Toprol XL 50 mg q.d. 2. Foltx one q.d. 3. Lipitor 10 mg q.h.s. 4. Altace 2.5 mg b.i.d. 5. Aspirin 81 mg q.d. 6. Nitroglycerin p.r.n.  SOCIAL HISTORY:  The patient lives in [place] with his wife of 7 years. He is a Administrator for [employer].  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  He quit smoking in the 1980s, and prior to this smoked 1/2 pack per day for 20 years.  FAMILY HISTORY:  Noncontributory for early coronary artery disease.  REVIEW OF SYSTEMS:  Positive for dysuria, hemorrhoids, mild lower extremity swelling, and occasional headaches.  Otherwise as stated in the HPI.  Negative for all other systems.  PHYSICAL EXAMINATION  GENERAL:  The patient is in no distress.  VITAL SIGNS:  Blood pressure 133/66, heart rate 55 and regular.  HEENT:  Unremarkable.  Pupils equal, round, react to light.  Fundi not visualized.  Oral mucosa unremarkable.  NECK:  No jugular venous distention.  Wave form within normal limits. Carotid upstroke brisk and symmetric.  No bruits, no thyromegaly.  NODES:  No cervical, axillary, or inguinal adenopathy.  LUNGS:  Clear to auscultation bilaterally.  BACK:  No costovertebral angle tenderness.  CHEST:  Unremarkable.  HEART:  PMI not displaced or sustained.  S1 and S2 within normal limits.  No S3, no S4.  No murmurs.  ABDOMEN:  Flat, positive bowel sounds, normal in frequency and pitch.  No bruits, no rebound, no guarding, no midline pulsatile mass, no hepatomegaly, no splenomegaly.  SKIN:  No rash, no nodules.  EXTREMITIES:  With 2+ pulses throughout.  No femoral bruits.  Mild bilateral lower extremity edema to above the ankles.  No cyanosis or clubbing.  NEUROLOGIC:  Oriented  to person, place, and time.  Cranial nerves II-XII grossly intact.  Motor grossly intact.  Electrocardiogram:  Sinus bradycardia, rate 57, axis within normal limits. Intervals within normal limits.  Poor anterior R-wave progression.  T-wave inversions in lead III and aVF.  A 0.5 mm ST elevation in aVL.  (No change from his February presenting electrocardiogram.)  LABORATORY DATA:  WBC 4.5, hemoglobin 10.5, platelets 257.  CPK-MB 2.4, INR 0.9, troponin 0.01.  Chest x-ray is pending.  ASSESSMENT/PLAN 1. Chest discomfort:  The patients chest discomfort is somewhat atypical    but reminiscent of his previous unstable angina.  The description of    neck discomfort is worrisome.  Currently he is pain-free.  His    electrocardiogram demonstrates no acute changes from his previous    electrocardiogram.  PLAN:  To continue to rule out a myocardial infarction.  Continue his beta blockers and aspirin.  He will be treated with heparin.  He will have an elective cardiac catheterization unless he has more urgent symptoms.  2. Hypertension:  His blood pressure is well-controlled.  PLAN:  Will continue with the medications as listed.  3. Hyperlipidemia.  PLAN:  We will continue the Lipitor.  I will try to obtain his most recent lipid profile.  If none has been obtained, we will order this in the hospital.  4. Anemia:  This is mild.  It is not different than his discharge blood    count in February.  On admission at that time he was not anemic.  PLAN:  We will check stool guaiacs and iron studies.  5. History of prostate cancer. Dictated by:   [name], M.D. [place] Attending Physician:  [name] DD:  11/09/01 TD:  11/10/01 Job: [id number]

## 2010-09-27 NOTE — Discharge Summary (Signed)
[place]  Patient:    [name], [name] Visit Number: [id number] MRN: [id number]          Service Type: MED Location: [id number] Attending Physician:  [name] Dictated by:   [name], P.A.-C. Admit Date:  11/09/2001 Discharge Date: 11/15/2001   CC:         [name], M.D.  [name], M.D. [facility]  [name]  W. [name]., M.D.  [name]. [name], M.D.   Referring Physician Discharge [place]  DATE OF BIRTH:  1935/01/08  ADMITTING PHYSICIAN:  [name], M.D.  DISCHARGING PHYSICIAN:  [name]. [name], M.D.  SUMMARY OF HISTORY:  Mr. [name] is a 76 year old black male, who presented to [facility], complaining of chest discomfort.  He describes this as a left-sided discomfort that was fleeting; however, what particularly bothered him was his left side of his neck became numb.  He felt that this was similar to the complaints that he had in February that prompted a catheterization and angioplasty.  He denied any associated symptoms such as nausea, vomiting, or diaphoresis, palpitations, presyncope or syncope, PND or orthopnea.  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  He does occasionally get chest discomfort and takes nitroglycerin sporadically.  He was most recently seen in the office in March for evaluation, and his stress Cardiolite demonstrated an EF of 56% without evidence of ischemia or infarction.  His history is notable for hyperlipidemia, hypertension, prostate cancer for which he refuses treatment.  He has known coronary artery disease.  Catheterization showed a left main of 25%, 25% proximal LAD, 50% mid LAD, codominant circumflex with an ostial 30% lesion, 70-80% mid circumflex, 90% ostial RCA, followed by diffuse mid 50% RCA  lesion.  There was a 90% stenosis at the right ventricular branch.  He underwent stenting of the circumflex, reducing this to a less than 10% residual.  The EF was 60% at the time.  It was notable, he had prostate surgery approximately 20 years ago.  It was also noted prior to admission, he was on Altace 2.5 mg b.i.d.; however, his wife states that he usually misses the p.m. dose of Altace.  LABORATORY DATA:  Urinalysis on the 2nd was unremarkable.  Iron on the 1st was low at 39, TIBC 361, percent saturation 11, ferritin 7.  Hemoglobin A1c was slightly elevated at 6.7.  CKs and troponins x 4 were negative for myocardial infarction.  Admission sodium was 134, potassium 4.6, BUN 18, creatinine 1.1. AST was slightly elevated at 53.  Otherwise LFTs were within normal limits. Subsequent chemistries were essentially unremarkable.  It was noted that glucoses ranged between the 90s and 130s.  Admission PT was 13.1, PTT 28. Admission H&H was 10.5 and 35.2.  MCV was slightly low at 65.9 and MCHC of 29.8.  Platelets were 257, WBC 4.5.  Room air ABGs performed on the 3rd showed a pH of 7.422, pCO2 40.8, pO2 66.1.  Stools were heme-negative x 1, positive x 1.  Helicobacter screen was urease positive.  Chest x-ray showed tortuous aorta, clear lungs.  EKGs showed normal sinus rhythm, sinus bradycardia.  His initial EKG on presentation to the emergency room showed some slight ST segment elevation in I and aVL and in V2 and V3; however, on comparison to  old EKGs, these changes are not new.  Subsequent EKGs were essentially the same.  HOSPITAL COURSE:  Mr. [name] was admitted to 3700.  Initial presentation was prompting urgent catheterization; however, with initial CK-MB, troponin unremarkable for myocardial infarction and no acute EKG changes on comparison with his old EKGs, he was initially treated with medicine.  He was started on IV heparin and his home medications.  With his anemia, iron studies were  also performed as previously.  A rectal was performed on November 09, 2001, and this was heme-negative.  On November 10, 2001, he did undergo cardiac catheterization by Dr. Lia Rivers.  According to his progress note, his ejection fraction was normal.  He had a 90% ostial RCA, 80% proximal RCA, 90% mid RCA, a small distal RCA.  He had a 30% left main, 40% proximal LAD, 80% mid LAD.  He had a 50% proximal circumflex, 95-70% mid circumflex.  He had a 90% branch off the circumflex.  CVTS was consulted, and Dr. Roxy Rivers discussed procedure, risks, and benefits with the patient and his family.  However, they desired additional time to consider their options.  Carotid Dopplers were performed on the right. This did not show any stenosis.  On the left, he has a 40-60% ICA stenosis with vertebral antegrade flow bilaterally.  The Allens test, bilateral Doppler, decrease greater than 50% with radial compromise.  Distal pulses in legs were patent.  Protonix was started, and his Altace was changed to 5 mg q.d.  Rivers consult was obtained.  Dr. Olevia Rivers saw the patient on the 3rd for chest discomfort and review of the films, he felt that circumflex could be intervened on with angioplasty and brachytherapy.  He felt the LAD disease was not critical and could be treated medically.  He stated that the CABG would have advantage of the need for repeat intervention.  The patient and his wife are still considering options and wished to discuss a second opinion with Dr. Wynonia Rivers.  Thus, Dr. Wynonia Rivers saw on November 11, 2001.  He spent a long time discussing with the patient and the wife the possibilities and risks of bypass versus intervention/brachytherapy.  Dr. Wynonia Rivers also recommended rehab postintervention to help with education issues, and he would like to go ahead  with intervention.  Dr. Olevia Rivers performed angioplasty brachytherapy on the circumflex, reducing the in-stent restenosis from 90% to 20%.  More pictures were taken of the LAD,  and he stated that the 70% lesion could probably be treated with intervention if absolutely needed.  On November 11, 2001, the patient complained of burning chest discomfort.  Rivers performed an endoscopy and performed a CLOtest.  Endoscopy did show gastritis.  Stools were heme-positive x 1.  Rivers notes on November 15, 2001, that the patient felt fine on the iron.  He has had frequent bright red bleeding with bowel movements, but it was felt secondary to radiation treatment from his prostate cancer.  They began him on a Prevpac and recommended that he continue this for two weeks because of the positive CLOtest.  After review, Dr. Olevia Rivers felt that he could be discharged home on November 15, 2001.  DISCHARGE DIAGNOSES: 1. Gastritis associated with heme-positive stools and iron deficiency anemia,    H. pyloric positive. 2. Progressive coronary artery disease, status post angioplasty, brachytherapy    of the circumflex lesion with the residual disease, as described. 3. History of hyperlipidemia, hypertension, prostate cancer.  DISPOSITION:  He is discharged home.  NEW MEDICATIONS: 1. Plavix  75 mg q.d. x 6 months. 2. Prevpac 1 q.d. x 14 days. 3. Niferex 150 mg b.i.d. 4. Altace 5 mg q.d. 5. Toprol XL 50 mg q.d. 6. Foltx 1 tab q.d. 7. Lipitor 10 mg q.h.s. 8. Coated aspirin 81 q.d. 9. Sublingual nitroglycerin p.r.n.  DISCHARGE INSTRUCTIONS: 1. He was instructed not to return to work until after he sees his physician. 2. Maintain a low salt, fat, cholesterol diet. 3. If he has any problems with his catheterization site, he was asked to call. 4. He will see Dr. Fran Lowes. on November 30, 2001, at 12 p.m. 5. He was asked to arrange a follow-up appointment with Dr. Kenton Kingfisher. 6. Prior to his discharge, cardiac rehab will see the patient either in the    hospital or arrange outpatient follow-up for a regular exercise program and    continuing education. Dictated by:   [name], P.A.-C. Attending Physician:   [name] DD:  11/15/01 TD:  11/15/01 Job: [id number]

## 2010-09-27 NOTE — Discharge Summary (Signed)
[name], [name]                          ACCOUNT NO.:  [id number]   MEDICAL RECORD NO.:  [id number]                   PATIENT TYPE:  INP   LOCATION:  2021                                 FACILITY:  [facility]   PHYSICIAN:  Darnelle Going, P.A. [facility]             DATE OF BIRTH:  04-Jan-1935   DATE OF ADMISSION:  01/09/2002  DATE OF DISCHARGE:  01/11/2002                                 DISCHARGE SUMMARY   REASON FOR ADMISSION:  Chest pain.   DISCHARGE DIAGNOSES:  1. Atypical chest pain, evaluate this admission with serial enzymes.  Total     CKs were elevated at 414, 591, and 591 respectively.  Exercise Cardiolite     performed.  Results without evidence of ischemia or infarct.  Ejection     fraction 60%.  2. Coronary artery disease status post stentable episode Flex placed in     February.  3. Restenosis discovered in July 2003, treated with cutting balloon and     brachy therapy.  Chest pain returned December 02, 2001 with catheterization     revealing left main disease of 30%, LAD with 60% mid vessel, 70% at the     apical vessel stenosis, left circumflex with a 30-40% mid stent stenosis     with 95% at the origin and 30% at the distal origin and RCA with a     nondominant vessel with 90% also and 80-95% mid vessel stenosis.  4. Hypertension.  5. Hyperlipidemia.  6. Prostate cancer, status post radiation seed implant.  7. History of peptic ulcer disease with positive Helicobacter pylori culture     followed by Dr. Ronnald Ramp.   HISTORY OF PRESENT ILLNESS:  This delightful gentleman with cardiac history  as outlined above came to our attention on the day of admission with sharp  anterior chest pain.  This was very intense and occurred episodically in 15-  30 minutes. It resolved spontaneously with no associated symptoms.  Given  his history, he needed to be admitted for further evaluation.   HOSPITAL COURSE:  The patient was admitted, continued on his home  medications, admitted to  telemetry.  Labs were drawn and with the exception  of the CK elevations mentioned above, were all negative for acute cardiac  injury.  The patient did show a hemoglobin of 9.2 and hematocrit of 29.3 on  admission, however, but apparently this has been a problem followed by  primary care for quite awhile as the patient is on Niferex.  EKG showed  sinus bradycardia with a first degree AV block with some nonspecific T wave  elevations in the septal leads.  During hospitalization on July 2003, the  patient's hemoglobin was 9.5, hematocrit 31.  The patient continued to have episodic chest pain.  This seemed to be  centered around the area underneath his left nipple.  He had a spell the  morning of is exercise Cardiolite  but was able to perform very well on the  treadmill, entering stage IV without difficulty.  Hemodynamically, his heart  rate rise was quite slow and heart rate recovery was very brisk.  Results of  his Cardiolite showed an EF of 60% but no ischemia or infarct seen.  After  discussion with Dr. Coralie Keens, it decided to discharge the patient to home  for a followup appointment, and he will keep a followup appointment with Dr.  Vita Barley on September 16, at 10:15 at which time Dr. Percival Spanish can make a  decision about continued use of his statin in view of his elevated CK's.   PHYSICAL EXAMINATION:  On the day of discharge the patient was seen by Dr.  Velora Heckler and offered no complaints of chest pain or dyspnea.   VITAL SIGNS:  Blood pressure 122/68, pulse 110, respirations 20, saturation  98% on room air, temperature 97.6.   GENERAL:  Well-developed, well-nourished, somewhat worried looking African-  American male in no acute distress.   CARDIOVASCULAR:  Regular rate and rhythm.  No murmur, rub, or gallop.   LUNGS:  Clear to auscultation bilaterally.   ABDOMEN:  Nontender.   EXTREMITIES:  1+ bilaterally with pitting edema.   LABORATORY DATA:  As mentioned earlier, CK #1  was 414, MB 2.3, troponin-I  0.02.  CK #2 591, MB 2.6, troponin-I 0.02.  Admission chemistry - sodium  138, potassium 3.9, BUN 13, creatinine 1.1, glucose 104, albumin low at 3.1.  Admission hemogram - WBC 4.3, hemoglobin 9.2, hematocrit 29.3, MCV 68.9, RDW  21.3, MCHC 31.5. Platelets are 263.  The patient's lipid profile showed  total cholesterol 110, triglycerides 37, HDL 50, LDL 53.  Liver panel showed  AST 36, ALT 31, ALP 83 and total bilirubin 0.6.   DISPOSITION:  The patient is discharged to home in the care of his very  loving family on the following medications:   DISCHARGE MEDICATIONS:  1. Aspirin 81 mg one daily.  2. Lopressor 25 mg one b.i.d.  3. Niferex-150 one b.i.d. This represents an increase over q.d. dosing and     the patient starts to increase stool softener in his regimen.  4. Vitamin C 500 mg one daily.  5. Plavix 75 mg one daily  6. Altace 5 mg one daily  7. Protonix 40 mg one daily  8. Nitroglycerin 0.04 mg as needed for chest pain.  The patient will hold off on Lipitor until he sees Dr. Percival Spanish for  evaluation.   ACTIVITY:  Recommendations are exercise by walking every day and to reduce  social stress in his life.   DIET:  Heart healthy diet.   FOLLOW UP:  The patient will follow up with Dr. Percival Spanish on September 16, at  10:15 at which time he will have a CBC to evaluate significant anemia.  He  knows to call me with any problems or concerns or change or increasing  symptoms.                                                Darnelle Going, P.A. [facility]    LK/MEDQ  D:  01/11/2002  T:  01/11/2002  Job:  [id number]   cc:   Dr. Aline Brochure

## 2010-09-27 NOTE — Consult Note (Signed)
[name], [name]                          ACCOUNT NO.:  [id number]   MEDICAL RECORD NO.:  [id number]                   PATIENT TYPE:  INP   LOCATION:  4704                                 FACILITY:  [facility]   PHYSICIAN:  Ludwig Lean. Doreatha Lew, M.D.            DATE OF BIRTH:  August 01, 1934   DATE OF CONSULTATION:  DATE OF DISCHARGE:                                   CONSULTATION   REASON FOR CONSULTATION:  Thank you very much for asking me to see this  patient.  He is a 76 year old gentleman with known coronary disease.  He  presented with an acute coronary syndrome in February 2003 and had a stent  placed in his left circumflex coronary artery.  It was interesting that he  was actually delivering furniture to Deboraha Sprang, M.D., at the time and  was referred for evaluation and therapy.  In July he developed restenosis  and was treated with cutting balloon and brachytherapy.  In December he had  chest pain and enzymes were negative, and now he presents with exertional  angina and dyspnea as he unloaded objects and was carrying them up a small  hill.  He had myocardial infarction ruled out and underwent cardiac  catheterization by Jenkins Rouge, M.D., on 07/08/02, which showed a 70% mid-  LAD stenosis with a 50% distal narrowing, left circumflex was a codominant  vessel with an 80% distal stent and bifurcation stenosis.  There was a  smaller proximal obtuse marginal that had a 95% stenosis.  The right  coronary artery had an ostial 80-90% stenosis and had a segmental 80%  stenosis in the midportion.  He had a well-preserved left ventricular  function.  He is referred for evaluation after initially refusing surgery  and asking to have PCI.   PAST MEDICAL HISTORY:  Past medical history includes prostate cancer treated  with radiation and radiation proctitis, hypertension, known left internal  carotid artery stenosis, hyperlipidemia, and hemorrhoids.   ALLERGIES:  CRESTOR caused  pruritus.   MEDICATIONS:  His current medications include aspirin, Toprol, Altace,  Protonix.  He is also on heparin infusion.   SOCIAL HISTORY:  He drives a truck for [employer].  He is married with three  children.  He does not use tobacco or alcohol.   REVIEW OF SYSTEMS:  Basically negative otherwise.   PHYSICAL EXAMINATION:  GENERAL:  He is a pleasant, quite talkative 59-year-  old African-American male.  VITAL SIGNS:  Blood pressure was 130/70, heart rate is 60 and regular.  HEENT:  Negative.  NECK:  Supple, no bruits.  CHEST:  Lungs are basically clear.  CARDIAC:  Regular rate and rhythm without gallop or rub.  EXTREMITIES:  Without edema.   LABORATORY DATA:  Hematocrit was 29.1 with MCV of 64.  Potassium was 3.7,  glucose 111, BUN was 12.  CK's and troponins were essentially negative.  The  troponin was  0.05.   His EKG showed prominent voltage with slight ST elevation I and aVL with ST  depression inferiorly and laterally.   OVERALL IMPRESSION:  1. Severe three-vessel coronary disease with normal left ventricular     function, status post previous stent to the left circumflex with     brachytherapy.  2. Diffuse coronary atherosclerosis in the right coronary artery as well as     an ostial stenosis, bifurcation stenosis after the stent placement in the     left circumflex, ostial stenosis in a small obtuse marginal branch of the     left circumflex, and 70% proximal one-third of the left anterior     descending coronary artery.   DISCUSSION:  I think the patient would be best treated with coronary artery  bypass grafting.  I think that procedure actually probably would entail less  risk.  I think the chances of complete revascularization and preservation of  left ventricular function would be greatest with coronary artery bypass  grafting, and I would expect that he would have a longer period with freedom  of symptoms.  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                                               Ludwig Lean. Doreatha Lew, M.D.    SNT/MEDQ  D:  07/11/2002  T:  07/11/2002  Job:  [id number]   cc:   [name], M.D. [facility]   Remo Lipps C. Roxan Hockey, M.D.  [address]  [place]  [place] [address]  Fax: [phone]

## 2010-09-27 NOTE — Consult Note (Signed)
[place]  Patient:    [name], [name] Visit Number: [id number] MRN: [id number]          Service Type: MED Location: [id number] [name] Dictated by:   [name]., M.D. Proc. Date: 11/11/01 Admit Date:  11/09/2001 Discharge Date: 11/15/2001   CC:         [name], M.D.  [name], M.D. [name]  [name], M.D. [facility]   Consultation Report  REASON FOR CONSULTATION:  I was asked to see this very nice 76 year old black male for cardiologic evaluation and second opinion regarding percutaneous coronary intervention of coronary disease.  HISTORY OF PRESENT ILLNESS:  The patient was admitted in February with unstable angina pectoris.  At that time, catheterization showed a codominant circulation with a diffusely diseased small nondominant right coronary artery, a circumflex vessel stented with a 3.0 x 12 Express stent and post dilated with a 3.5 balloon.  He was reported as having moderate LAD disease at that time.  He had some atypical recurrent symptoms and reportedly had a negative Cardiolite in March.  He had gotten along fairly well with his work but had recurrence of chest discomfort similar to his previous pain, lasting a few seconds and had a left-sided neck discomfort lasting several minutes.  He presented to the emergency room and was admitted to rule out an MI.  His electrocardiograms showed some minor ST and T wave abnormalities with slight ST elevation.  CPK and MBs were negative.  He underwent catheterization which I have reviewed by Dr. Lia Rivers yesterday. He has an ostial stenosis involving the right coronary artery and then diffuse disease in the mid portion.  The left coronary system is not optimally visualized.  There was streaming in the mid LAD, and it was hard to estimate how severe the stenosis was in the mid portion.  There was a 95% in-stent restenosis prior to  the continuation branch of the circumflex.  The right coronary artery was significantly small.  He was somewhat hypertensive at that time also.  Initially he was recommended for surgery, and I was asked to consult on the patient because the family requested that Dr. Doreatha Rivers see the patient because they had taken care of family members.  He has not had any chest pain in the past day or so.  PAST MEDICAL HISTORY:  Remarkable for hypertension and some hyperlipidemia. He has a history of prostate cancer treated with seed implant.  He has a history of pneumonia previously.  ALLERGIES:  None.  MEDICATIONS ON ADMISSION:  Altace, Foltx, Toprol, and Lipitor.   For some reason, he was not on aspirin.  SOCIAL HISTORY:  He works as a Administrator for [employer].  He also does some unloading.  REVIEW OF SYSTEMS:  Remarkable for a recent colonoscopy, and he is thought to have some radiation proctitis.  He has had seed implants and had some sort of prostate surgery in the past couple of years.  Other than as noted above, the remainder of the Review of Systems is unremarkable.  PHYSICAL EXAMINATION:  GENERAL:  He is a pleasant black male who appears younger than stated age.  VITAL SIGNS:  Blood pressure 140/70, pulse 76.  SKIN:  Warm and dry.  LUNGS:  Clear.  CARDIAC:  Normal S1 and S2, no S3.  ABDOMEN:  Soft, nontender.  No hepatosplenomegaly.  EXTREMITIES:  Pulses are 2+.  DIAGNOSTIC DATA:  A  12-lead ECG shows nonspecific ST-T wave abnormality.  IMPRESSION: Coronary artery disease with an 80 to 90% in-stent restenosis in the circumflex, diffusely diseased right coronary artery, and left anterior descending artery disease of moderate severity in my opinion.  I reviewed options with the patient and spent a good bit of time with him discussing coronary artery bypass grafting as an option which is a fine option versus consideration of a single-vessel intervention of the  circumflex artery. We discussed the merits of surgery in terms of longer result with less chance of recurrence versus approach with percutaneous intervention which would involve potential restenosis and potential repeat intervention.  I think they understand the issues involved at this time.  My opinion is that the LAD disease is likely borderline and would certainly need to be reassessed at the time of the circumflex intervention.  If it appeared to be more critical than it appeared on the recent angiograms, then he certainly could be considered for surgery.  However, he would be a good candidate for brachytherapy if the lesion was only moderate.  I would note that he had a negative Cardiolite in March which would seem to indicate the lesion was not significant in the LAD.  I appreciate seeing this nice man with you.  Sincerely yours, Dictated by:   [name]., M.D. [name] DD:  11/11/01 TD:  11/15/01 Job: [id number]

## 2010-09-27 NOTE — Consult Note (Signed)
NAMEKESHONE, YETTER NO.:  [id number]   MEDICAL RECORD NO.:  [id number]                   PATIENT TYPE:  EMS   LOCATION:  [place]                                 FACILITY:  [facility]   PHYSICIAN:  Dwaine Deter, M.D.          DATE OF BIRTH:  11/20/1934   DATE OF CONSULTATION:  08/12/2002  DATE OF DISCHARGE:  08/12/2002                                   CONSULTATION   FINDINGS:  1. Angioedema of the left lip and left face. No tongue or neck involvement.     The patient has responded nicely and briskly to intravenous Solu-Medrol,     Pepcid and  Benadryl in the [facility] ER. He has had no     evidence of respiratory compromise at all throughout the clinical course     today.  2. Status post coronary artery bypass grafting on July 14, 2002, by Dr.     Tharon Aquas Trigt with 3-vessel bypass using saphenous vein graft to the     left anterior descending artery, right coronary artery and circumflex.     The left internal mammary artery could not be used in that it was     thickened with stenosis x2.  3. History of hypertension.  4. History of prostate cancer, status post radiation therapy with radiation     proctitis.  5. Hyperlipidemia.  6. Hemorrhoids.  7. A 50% left internal carotid artery stenosis.  8. Anemia.   MEDICATIONS:  1. Aspirin 325 mg p.o. daily.  2. Toprol  XL 25 mg daily.  3. Folic acid 1 mg daily.  4. Lasix 20 mg being switched to Demodex 20 mg daily today per Dr. Tharon Aquas     Trigt.  5. Protonix 40 mg daily.  6. Niferex 150 mg daily.  7. Colace 100 mg b.i.d.  8. Altace 5 mg b.i.d. and this was resumed 4 days ago. He had 1 dose earlier     today but none this evening.   HOSPITAL/ER COURSE:  The patient was  feeling relatively well in general and  saw Dr. Tharon Aquas Trigt today. Again he  had noticed some swelling of his  left lip 3 days ago on resuming Altace. This was only minor and very  transient. Today just after  noon he developed left lip swelling and left  facial swelling but no respiratory distress and no swelling of his tongue  and no neck swelling.   He presented to the [facility] ER and was treated with Solu-Medrol  125 mg IV x1, 50 mg IV x1 and Pepcid 20 mg orally x1. The patient had fairly  brisk reducing of his swelling over 1 to 2 hours. Again he had no  respiratory distress at all at any point in time during the day.   PHYSICAL EXAMINATION:  VITAL SIGNS:  Vital signs were excellent with a blood  pressure  of 120/69 on admission, pulse 70 and regular, respiratory rate 20  and easy, temperature 98.6, room air saturation 99% on room air.  HEENT:  The edematous left lip and mildly edematous left face. The tongue  was not swollen.  NECK:  Supple without any obvious JVD.  RESPIRATORY:  The patient could breathe very well and was not drooling.  CHEST:  Clear on the right with decreased breath sounds on the left base.  The surgical wound was well healed around the sternum.  CARDIAC:  Regular rhythm without murmur or gallop.  ABDOMEN:  Soft, nontender.  EXTREMITIES:  Without cyanosis, clubbing or edema.   ASSESSMENT:  Angioedema secondary to likely to his ACE inhibitors. The  patient had excellent response to Solu-Medrol, Pepcid and  Benadryl.   PLAN:  Will discharge home on Loratadine 10 mg b.i.d. and prednisone 20 mg  b.i.d. for the next 2 days. The patient is to be followed up early in the  week with a chest x-ray and a thoracentesis for the left pleural effusion.  Again he was in no respiratory distress.   If the swelling should reoccur he should contact his physician immediately.  Altace will be discontinued. He is to continue on other medicines as above.                                               Dwaine Deter, M.D.    RNG/MEDQ  D:  08/12/2002  T:  08/15/2002  Job:  [id number]   cc:   Azalia Bilis, M.D.  [place], [place] [address]  Fax:  [phone]   Len Childs, M.D.  [place]  [place] [address]  Fax: [phone]   Jenkins Rouge, M.D. [facility]

## 2010-09-27 NOTE — Discharge Summary (Signed)
[name], [name]                          ACCOUNT NO.:  [id number]   MEDICAL RECORD NO.:  [id number]                   PATIENT TYPE:  INP   LOCATION:  [place]                                 FACILITY:  [facility]   PHYSICIAN:  Dola Argyle, M.D. [facility]              DATE OF BIRTH:  [birth year]   DATE OF ADMISSION:  04/18/2002  DATE OF DISCHARGE:                           DISCHARGE SUMMARY - REFERRING   CHIEF COMPLAINT:  Chest pain.   HISTORY OF PRESENT ILLNESS:  This is a 76 year old male followed in our  group by Dr. Percival Spanish who presented to the [facility] Emergency  Room for evaluation of chest pain. The patient was noted to be severely  anemic at the time of admission with a hemoglobin of 7.7.   The patient stated that he had multiple episodes of chest pain starting  approximately 7:30 on the morning of admission. He is a fairly poor  historian. His wife offered much of the history. Apparently each episode  lasted approximately 5 minutes. He took nitroglycerin on one occasion with  some relief. He has had no other recent chest pain until today. His pain is  somewhat different from his previous pain that he has experienced with his  heart. He described the pain as being on the left side of his chest. It felt  dull. There was no associated shortness of breath, no diaphoresis, no  nausea. The patient was pain-free when seen in the emergency room. He was  seen and evaluated by Dr. Ron Parker and arrangements were made to admit the  patient for further evaluation.   PAST MEDICAL HISTORY:  Significant for coronary artery disease. The patient  had a circumflex stent placed in 06/2001. He had restenosis of that stent and  had a cutting balloon procedure with brachy therapy 11/15/2001. He had a  recatheterization on 12/02/2001 that revealed a 30% left main. The LAD had a  60% lesion. The circumflex had a 30-40% restenosis of the stent site. There  was a 95% stenosis of the origin of OM1.   There was a 30% stenosis at the  origin of OM2. The RCA was nondominant. There was a 90% osteal lesion as  well as 80% and 95% mid lesions. The patient had a Cardiolite performed in  12/2001 that was negative for ischemia with an EF of 60%. He has a remote  history of peptic ulcer disease that has been followed by Dr. Laurence Spates.  He has a history of hypertension and elevated lipids, prostate CA.  Apparently his Lipitor was recently discontinued by Dr. Percival Spanish we believe  secondary to elevated LFTs, although we do not have that information at this  time.   ALLERGIES:  No known drug allergies.   MEDICATIONS:  Medications at the time of admission included Lipitor, which  was recently discontinued, Toprol XL 50 mg daily, Protonix 40 mg daily,  aspirin daily. It  was unclear whether he was on Plavix prior to admission or  not. Altace 5 mg b.i.d. and nitroglycerin p.r.n.   SOCIAL HISTORY:  The patient lives in [place] with his wife. They have  three children. He has a remote tobacco history. The patient denies alcohol  use. He works as a Administrator for [employer].   FAMILY HISTORY:  Negative for coronary artery disease in both parents. He  has a brother that had a stent in his 28s.   REVIEW OF SYMPTOMS:  Negative except for the following:  He has recently had  some weakness and fatigue as well as mild weight gain. Please see above for  cardiovascular symptoms. No history of CVA. No history of seizures. He has  had some bright red blood in his bowel movements on occasions. The remainder  of the review of systems is negative.   PHYSICAL EXAMINATION:   GENERAL:  A 75 year old black male in no acute distress.   VITAL SIGNS:  Blood pressure 133/63, pulse 59.   HEENT:  Unremarkable.   NECK:  No bruits, no jugular vein distention.   HEART:  Regular rate and rhythm without murmur.   LUNGS:  Clear.   ABDOMEN:  Soft, nontender with positive bowel sounds.   EXTREMITIES:  Pulses weak  to absent with trace edema.   SKIN:  Warm and dry.   NEUROLOGIC:  Grossly intact.   LABORATORY DATA:  Chest x-ray shows mild left ventricular enlargement. The  EKG showed normal sinus rhythm with a rate of 64 beats per minute with  nonspecific changes. Hemoglobin was 7.7, hematocrit 25.4, WBCs 4,400,  platelets 289,000. CK-MB:  CK was 176, MB 2.2, index 1.3. Troponin I was  0.01. Chemistry profile revealed a BUN of 17, creatinine 1.1, potassium 4.0.  LFTs within normal limits. INR 0.9, PTT 27.   IMPRESSION:  1. Chest pain with negative enzymes x1.  2. History of coronary artery disease as documented above with previous     circumflex stent with restenosis. Most recent ejection fraction noted to     be 60%.  3. Severe anemia.  4. Remote history of peptic ulcer disease.  5. History of hypertension.  6. Elevated lipids.  7. Carotid artery disease.  8. History of prostate CA.    PLAN:  Admit the patient. We will obtain a GI consult and an internal  medicine consult. We will not anticoagulate the patient secondary to his  anemia. We will check stool Guaiacs. We will check further enzymes to rule  out MI. Ultimately the patient will need a further ischemic workup with  either a Cardiolite or a heart catheterization.     Mikey Bussing, P.A. [facility]                  Dola Argyle, M.D. [facility]    DR/MEDQ  D:  04/18/2002  T:  04/18/2002  Job:  [id number]   cc:   Azalia Bilis, M.D.  [place] [place], [place] [address]  Fax: [phone]   Dr. Oletta Lamas

## 2010-09-27 NOTE — H&P (Signed)
NAMESANAT, [name]                          ACCOUNT NO.:  [id number]   MEDICAL RECORD NO.:  [id number]                   PATIENT TYPE:  INP   LOCATION:  2021                                 FACILITY:  [facility]   PHYSICIAN:  [name], M.D. [facility]      DATE OF BIRTH:  1934-06-16   DATE OF ADMISSION:  01/09/2002  DATE OF DISCHARGE:                                HISTORY & PHYSICAL   PRIMARY CARDIOLOGIST:  [name], M.D.   PRIMARY CARE PHYSICIAN:  Dr. Aline Rivers.   CHIEF COMPLAINT:  Chest pain.   HISTORY OF PRESENT ILLNESS:  The patient is a 76 year old black male with a  history of coronary artery disease who presents with sharp, left-sided chest  pain that began at approximately 7:30 p.m. on 01/09/02.  He describes this as  lightening like pain, very intense, occurring approximately every 15 to 30  minutes.  It occurs and resolves spontaneously.  It never lasts for more  than a second or so.  He has no associated dyspnea, diaphoresis,  palpitations, or presyncope.  He tried nitroglycerin at home without relief.  His wife went to the local EMS station who came to get the patient, gave him  nitroglycerin, and questionably provided relief.  He has had no orthopnea,  PND, or worsening lower extremity edema.  He states that this chest  discomfort is different from his prior cardiac chest discomfort which was  more of a something pain.   PAST MEDICAL HISTORY:  1. Coronary artery disease.  In 2/03, he had a stent placed in his left     circumflex.  On 11/11/01, he had restenosis of that lesion and underwent     cutting balloon and brachytherapy.  On 12/02/01, he had a repeat cardiac     catheterization which revealed a left dominant system, a left main that     was 30%, a left anterior descending that was 60% mid and 70% apical, left     circumflex was 30 to 40% mid (instent), 95% of the origin of OM1, and 30%     at the origin of OM2.  His right coronary artery was  nondominant, had a     90% ostial, 80%, and 95% mid.  2. Hypertension.  3. Hyperlipidemia.  4. History of prostate cancer, status post radiation seed implantation.   MEDICATIONS:  1. Aspirin 81 mg q.d.  2. Plavix 75 mg p.o. q.d., with the plan to take for six months from 7/03.  3. Lipitor 10 mg q.d.  4. Altace 5 mg q.d.  5. Toprol XL 50 mg q.d.  6. Folate.  7. Iron.   ALLERGIES:  No known drug allergies.   SOCIAL HISTORY:  He lives in [place] with his wife.  He has a distant  tobacco history.  He does not drink alcohol.  He exercises at cardiac rehab,  and also with an [employer] machine that he recently bought.  FAMILY HISTORY:  Significant for a brother with a stroke in his 20's.   REVIEW OF SYMPTOMS:  Unremarkable, other than what was in the HPI.   ADVANCED DIRECTIVES:  He is a full code.   PHYSICAL EXAMINATION:  VITAL SIGNS:  He was afebrile.  His blood pressure  was 102/60, heart rate 48, O2 was 100% on 4 L.  GENERAL:  He is a well-developed male in no acute distress.  HEENT:  Examination was unremarkable.  NECK:  Without bruit or JVD.  CARDIOVASCULAR:  Regular bradycardia without gallop, murmur, or rub.  LUNGS:  Clear.  SKIN:  Unremarkable.  ABDOMEN:  Normal.  RECTAL:  Heme negative.  EXTREMITIES:  Revealed 1+ edema bilaterally which was stable.   LABORATORY DATA:  Chest x-ray showed no acute disease.  His  electrocardiogram showed sinus bradycardia with nonspecific T-wave  abnormalities that were unchanged from his previous electrocardiogram.  His  white blood cell count was 4.3, hemoglobin 9.2, hematocrit 29.3 (stable).  His platelets were 260.  His Chem-7 showed sodium of 138, potassium 3.9,  chloride 106, bicarbonate 26, BUN 13, creatinine 1.1, glucose 104.  Total  bilirubin 0.6, AST 36, ALT 31, alkaline phosphatase 83.  His albumin was  3.1.  Total CK 591, MB 2.6, troponin-I was 0.02.  His PTT was 30, PT was  13.7, INR was 1.   ASSESSMENT AND PLAN:  1.  Atypical chest pain.  It is not entirely clear what is the source of his     pain.  While I was examining him he appeared to have paroxysms of this     lightening pain that was obviously distressful.  He had no     reproducibility on palpation of his chest wall.  He had no worsening of     his pain on inspiration, and he had no relief with sitting forward.  The     pain never lasted more than a fraction of a second.  Given his troubles     with his mid left circumflex, we will rule him out for myocardial     infarction.  The best strategy may be to proceed with an exercise     Cardiolite which will help [place] to identify any areas of ischemia, as well     as to risk stratify him with his exercise time.  I will continue his     current medications.  He was given heparin in the emergency department     which I will likely discontinue after a second set of negative     biomarkers.  If he rules in, then we will readdress this.  2. Elevation in his total CPK.  He has been on Lipitor for quite some time.     He denies any recent aerobic activity or physical exertion that would     account for his total CK elevation.  I will recheck this with the next     set of biomarkers, and if it remains elevated consider discontinuing     Lipitor.  If he does have a statin induced myalgia, this may account for     some of his chest pain, although it does seem to be more neuropathic in     origin.  [name], M.D. [facility]    RPK/MEDQ  D:  01/10/2002  T:  01/11/2002  Job:  [id number]

## 2010-09-27 NOTE — Consult Note (Signed)
NAMEKAYDAN, CAFFREY                          ACCOUNT NO.:  [id number]   MEDICAL RECORD NO.:  [id number]                   PATIENT TYPE:  INP   LOCATION:  4704                                 FACILITY:  [facility]   PHYSICIAN:  Revonda Standard. Roxan Hockey, M.D.         DATE OF BIRTH:  August 26, 1934   DATE OF CONSULTATION:  DATE OF DISCHARGE:                                   CONSULTATION   CHIEF COMPLAINT:  Chest pain.   HISTORY OF PRESENT ILLNESS:  Mr. Yoshimoto is a 76 year old gentleman with known  coronary artery disease. He is status post Stenting of circumflex stenosis  in February of 2003. In July, he developed re-stenosis which was treated  with a cutting balloon and brachy therapy. In December, he had chest pain.  The enzymes were negative. Now, however,he has developed exertional angina  and dyspnea. He developed this first on Thursday when he was actually  working lifting heavy objects and carrying them up an incline. He developed  severe burning  central substernal chest pain that was relieved with rest.  However, the following day, he had an episode with minimal exertion. He was  fatigued and shortness of breath. He did not have any nausea or diaphoresis.  He contacted Dr. Rosezella [place] office and came to the emergency room where he  had ST abnormality. He was admitted. His troponin was 0.05. CK and MB were  negative. He underwent cardiac catheterization on the 27th which showed a  70% mid LAD stenosis with 50% distal stenosis. The circumflex was a large co-  dominant vessel with an 80% mid end Stent stenosis. His right coronary had  an 80% osteal stenosis and 80% mid stenosis. Left ventricular function was  preserved with an ejection fraction of 60%. The patient has been pain free  since hospitalization.   PAST MEDICAL HISTORY:  Significant for coronary artery disease, prostate  cancer status post radiation treatment, radiation proctitis, hypertension,  known left internal carotid  stenosis at 50%, hyperlipidemia, and  hemorrhoids.   MEDICATIONS:  At the time of admission included Toprol XL 50 mg each day,  Altace 5 mg each day, Protonix 40 mg each day, and a baby aspirin 81 mg each  day. He had been on Plavix previously but has been off this for greater than  one month. He had been on Crestor but stopped that secondary to pruritus.   ALLERGIES:  CRESTOR causes pruritus.   FAMILY HISTORY:  No history of coronary disease in his parents but he has a  brother who has had a Stent for coronary disease.   SOCIAL HISTORY:  He lives in [place]. He is a Administrator for [employer]. He  is married with three kids. He does not use tobacco or alcohol.   REVIEW OF SYSTEMS:  He has been easily fatigued recently. He has had some  shortness of breath with exertion. He occasionally had bloody stools, he  thinks sometimes related to what he eats and has been told that this is from  radiation proctitis.   PHYSICAL EXAMINATION:  GENERAL: A well developed, well nourished 76 year old  African-American male in no acute distress.  VITAL SIGNS: Blood pressure 130/70, pulse 60, respiratory rate 20. He is  afebrile.  NEURO: Awake, alert, and oriented times three and grossly intact.  HEENT: Unremarkable. He does wear glasses.  NECK: Supple without thyromegaly, adenopathy or bruits.  CARDIAC: Regular rate and rhythm. Normal S1 and S2. No murmur, rub, or  gallop.  LUNGS: Clear to auscultation and percussion. No wheezing.  ABDOMEN: Soft and nontender.  EXTREMITIES: Without clubbing, cyanosis, or edema. He has 2+ radial pulses  bilaterally. He has no peripheral edema or varicosities of the lower  extremities. He has 1+ dorsalis pedis pulses bilaterally.   LABORATORY DATA:  Sodium 139, potassium 3.9, BUN 20, creatinine 1.1, albumin  3.1, white count 7.6, hematocrit 28, platelets 257, PT 13.1, PTT 73 on  Heparin.   IMPRESSION:  Mr. Studer is a 76 year old African-American male who presents   with recurrent unstable angina. He has had previous angioplasty and stenting  of his left circumflex, subsequently treated with repeat angioplasty and  brachio therapy but now has recurrent stenosis. He also has moderate disease  in his LAD as well as disease in a medium size co-dominant right coronary.  LV function is preserved with an ejection fraction of 60%.   PLAN:  I had a long discussion with Mr. Paddock and his family. They have a  limited understanding of the basic underlying disease and natural history  thereof.  We spent a long time reviewing that. I also spent a long time  reviewing options including medical therapy, angioplasty and coronary artery  bypass grafting. We did discuss the nature and extent of operation including  the incision to be used, expected postoperative length of stay and suspected  overall recovery. They understand the expected outcomes as well as the  possibility of recurrent disease, long term as well as possible early or  late graft occlusion. I discussed in detail with Mr. Scarcella and his family, the  indications, risks, benefits, and alternative treatments and they understand  that coronary artery bypass grafting and the risks, including but not  limited to, death, stroke, myocardial infarction, deep vein thrombosis,  pulmonary embolism, bleeding, possible need for transfusion, infection as  well as system dysfunction including respiratory, renal, hepatic, or GI  dysfunction. They understand that coronary artery bypass grafting would  involve more discomfort and the longer recuperation in the short term, would  likely have a better chance of long term success than repeat angioplasty. He  and his wife wish to think over their options and discuss further with a  Cardiologist. I will be happy to meet with them again if I can answer any  more of their questions.   Thank you very much.                                               Revonda Standard Roxan Hockey,  M.D.   SCH/MEDQ  D:  07/10/2002  T:  07/10/2002  Job:  [id number]   cc:   Thomas C. Verl Blalock, M.D. [facility]   Azalia Bilis, M.D.  [facility] [place], [place] [address]  Fax: [phone]

## 2010-09-27 NOTE — Op Note (Signed)
NAMETRINTON, MACFADYEN                            ACCOUNT NO.:  [id number]   MEDICAL RECORD NO.:  [id number]                   PATIENT TYPE:  INP   LOCATION:                                       FACILITY:  [facility]   PHYSICIAN:  Ivin Poot III, M.D.           DATE OF BIRTH:  [birth year]   DATE OF PROCEDURE:  07/14/2002  DATE OF DISCHARGE:                                 OPERATIVE REPORT   OPERATION:  Coronary artery bypass grafting x3 (bypass grafts to the LAD,  right coronary artery, circumflex marginal).   PREOPERATIVE DIAGNOSIS:  Class IV unstable angina with severe three-vessel  coronary artery disease status post circumflex stent.   POSTOPERATIVE DIAGNOSIS:  Class IV unstable angina with severe three-vessel  coronary artery disease status post circumflex stent.   SURGEON:  Ivin Poot, M.D.   ASSISTANT:  Suzzanne Cloud, P.A.   ANESTHESIA:  General by Crissie Sickles Conrad [place], M.D.   INDICATIONS:  The patient is a 76 year old hypertensive male status post  circumflex stenting in 2003.  He presented with re-stenosis and unstable  angina.  Cardiology recommended surgical revascularization based on his  coronary anatomy and symptoms of unstable angina.  I was asked to reevaluate  the patient for surgery and reviewed the patient's cardiac catheterization  and preoperative studies with the patient and his wife.  I reviewed the  indications and expected benefits of coronary artery bypass surgery for  treatment of his coronary artery disease as well as the alternatives to  surgical therapy.  I reviewed with the patient and wife the major aspects of  the proposed operation including the location of the surgical incisions, the  use of general anesthesia and cardiopulmonary bypass, the choice of conduit  for grafting, and the expected postoperative hospital recovery period.  I  discussed with the patient the risks to him of coronary artery bypass  surgery including risks of MI, CVA,  bleeding, infection, blood transfusion  requirement for his preoperative anemia, and death.  He understood these  implications for the surgery and agreed to proceed with the operation as  planned under what I felt was an informed consent.   OPERATIVE FINDINGS:  The patient's saphenous vein was below average quality,  and bilateral leg incisions were required in order to harvest adequate vein.  Both thigh veins were used and his lower leg vein was small and suboptimal.  The patient has left ventricular hypertrophy consistent with his history of  hypertension.  The mammary artery was thickened and had two areas of  stenosis, and an attempt was made to use it as a free graft but the vessel  was too short to use.   PROCEDURE:  The patient was brought to the operating room and placed supine  on the operating table where general anesthesia was induced under invasive  hemodynamic monitoring.  The chest, abdomen and legs were prepped with  Betadine and draped as a sterile field.  A right femoral arterial line was  placed due to a dampened arterial line in the right upper extremity which  was the second arm A-line attempted by the anesthesiology service.  A  sternal incision was then made as the saphenous vein was harvested from the  legs using the endoscopic technique on the right thigh and the open  technique on the left thigh.  The left internal mammary artery was harvested  from the left chest; however, it had suboptimal flow.  A 1.5-mm probe was  passed to the midpoint of the vessel with some obstruction.  It was elected  to detach the mammary artery from the chest wall and examine it as a free  graft; however, the probe would still not pass.  The vessel was thickened  and was therefore not used.  Heparin was administered, and the ACT was  documented as being therapeutic.  The sternal retractor was placed.  A  pursestring was placed in the ascending aorta and right atrium.  The patient  was  cannulated and placed on bypass and cooled to 32 degrees.  The  coronaries were identified for grafting.  The vein grafts were prepared for  the distal anastomoses, and a cardioplegia cannula was placed in the aorta.  As the aortic cross clamp was applied, 700 mL of cold blood cardioplegia  were delivered to the aortic root with good cardioplegic arrest.  There was  evidence of mild aortic insufficiency.  Topical ice saline slush was used to  augment myocardial preservation.  A pericardial insulator pad was used to  protect the left phrenic nerve.   The distal coronary anastomoses were then performed.  The first distal  anastomosis was to the obtuse marginal of the circumflex.  This had diffuse  disease and had a proximal 95% stenosis at the previously placed stent site.  A reversed saphenous vein was sewn end to side to this 1.5-mm vessel, and  there was good flow through the graft.  The second distal anastomosis was to  the right coronary artery.  This was a 1.2-mm non-dominant vessel and had  proximal 95% stenosis.  A reversed saphenous vein was sewn end to side with  running 7-0 Prolene, and there was good flow through the graft.  Cardioplegia was dosed through the vein grafts.  The third distal  anastomosis was to the distal LAD.  This was a 1.8 to 2.0-mm vessel with  proximal 90% stenosis.  A reversed saphenous vein was placed distally with a  running 7-0 Prolene with excellent flow through the graft.  A dose of warm  blood was given through the vein grafts prior to releasing the cross clamp.   The heart was cardioverted back to a regular rhythm.  Using a partial-  occluding clamp, three proximal vein anastomoses were placed on the  ascending aorta using a 4-mm punch and running 6-0 Prolene.  The partial  clamp was removed, and the vein grafts were perfused.  Each had excellent flow, and hemostasis was documented to the proximal and distal anastomoses.  The patient was re-warmed and  re-perfused.  Temporary pacing wires were  reapplied.  The lungs were re-expanded, and the ventilator was resumed.  The  patient was weaned from bypass without difficulty.  Protamine was  administered.  There was no adverse reaction to the protamine.  The patient  was stable.  The cannulas were removed.  The mediastinum was irrigated with  warm  antibiotic irrigation.  The superior pericardial fat was closed over  the aorta and vein grafts.  Two mediastinal and a left pleural chest tube  were placed and brought out through separate incisions.  The sternum was  closed with interrupted steel wire.  The pectoralis fascia was closed with  interrupted Vicryl.  The subcutaneous and skin were closed with running  Vicryl.  The leg incisions were irrigated and closed in a standard fashion.  Total bypass time was 100 minutes with aortic cross-clamp time of 38  minutes.                                               Len Childs, M.D.    PV/MEDQ  D:  07/14/2002  T:  07/15/2002  Job:  [id number]   cc:   [facility] Card[facility]   CVTS office

## 2011-02-20 LAB — DIFFERENTIAL
Basophils Absolute: 0
Basophils Relative: 1
Eosinophils Absolute: 0.3
Eosinophils Relative: 7 — ABNORMAL HIGH
Lymphocytes Relative: 30
Lymphs Abs: 1.4
Monocytes Absolute: 0.5
Monocytes Relative: 11
Neutro Abs: 2.4
Neutrophils Relative %: 52

## 2011-02-20 LAB — URINALYSIS, ROUTINE W REFLEX MICROSCOPIC
Glucose, UA: NEGATIVE
Leukocytes, UA: NEGATIVE
Protein, ur: NEGATIVE
Urobilinogen, UA: 0.2
pH: 6.5

## 2011-02-20 LAB — BASIC METABOLIC PANEL
BUN: 14
CO2: 29
Calcium: 9.2
Chloride: 102
Creatinine, Ser: 1.24
GFR calc Af Amer: >60
GFR calc non Af Amer: 58 — ABNORMAL LOW
Glucose, Bld: 114 — ABNORMAL HIGH
Potassium: 4.9
Sodium: 137

## 2011-02-20 LAB — CBC
HCT: 43.7
Hemoglobin: 14.5
MCHC: 33.2
MCV: 84.9
Platelets: 178
RBC: 5.15
RDW: 14.7 — ABNORMAL HIGH
WBC: 4.6

## 2011-02-27 LAB — CBC
HCT: 45.5
Hemoglobin: 15
MCHC: 33
MCV: 84.6
Platelets: 197
RBC: 5.38
RDW: 15.3 — ABNORMAL HIGH
WBC: 6.5

## 2011-02-27 LAB — URINALYSIS, ROUTINE W REFLEX MICROSCOPIC
Bilirubin Urine: NEGATIVE
Glucose, UA: NEGATIVE
Nitrite: NEGATIVE
Protein, ur: NEGATIVE
Specific Gravity, Urine: 1.026
pH: 5.5

## 2011-02-27 LAB — DIFFERENTIAL
Basophils Absolute: 0
Basophils Relative: 0
Eosinophils Absolute: 0.1
Eosinophils Relative: 1
Lymphocytes Relative: 10 — ABNORMAL LOW
Lymphs Abs: 0.7
Monocytes Absolute: 0.3
Monocytes Relative: 5
Neutro Abs: 5.5
Neutrophils Relative %: 84 — ABNORMAL HIGH

## 2011-02-27 LAB — COMPREHENSIVE METABOLIC PANEL
ALT: 23
AST: 25
Albumin: 3.6
Alkaline Phosphatase: 88
BUN: 12
CO2: 25
Calcium: 8.7
Chloride: 104
Creatinine, Ser: 1.03
GFR calc Af Amer: >60
GFR calc non Af Amer: >60
Glucose, Bld: 109 — ABNORMAL HIGH
Potassium: 3.9
Sodium: 136
Total Bilirubin: 1.1
Total Protein: 7.2

## 2011-02-27 LAB — LIPASE, BLOOD: Lipase: 25

## 2011-02-27 LAB — URINE MICROSCOPIC-ADD ON

## 2011-03-24 ENCOUNTER — Ambulatory Visit
Admission: RE | Admit: 2011-03-24 | Discharge: 2011-03-24 | Disposition: A | Discharge status: Home / Self Care | Payer: Medicare Other | Source: Ambulatory Visit | Attending: Family Medicine | Admitting: Family Medicine

## 2011-03-24 ENCOUNTER — Other Ambulatory Visit: Payer: Self-pay | Admitting: Family Medicine

## 2011-03-24 DIAGNOSIS — R52 Pain, unspecified: Secondary | ICD-10-CM

## 2011-03-24 DIAGNOSIS — R609 Edema, unspecified: Secondary | ICD-10-CM

## 2013-08-01 ENCOUNTER — Emergency Department (HOSPITAL_COMMUNITY): Payer: Medicare Other

## 2013-08-01 ENCOUNTER — Encounter (HOSPITAL_COMMUNITY): Payer: Self-pay | Admitting: Emergency Medicine

## 2013-08-01 ENCOUNTER — Emergency Department (HOSPITAL_COMMUNITY)
Admission: EM | Admit: 2013-08-01 | Discharge: 2013-08-01 | Disposition: A | Discharge status: Home / Self Care | Payer: Medicare Other | Attending: Emergency Medicine | Admitting: Emergency Medicine

## 2013-08-01 DIAGNOSIS — Y939 Activity, unspecified: Secondary | ICD-10-CM | POA: Insufficient documentation

## 2013-08-01 DIAGNOSIS — S8990XA Unspecified injury of unspecified lower leg, initial encounter: Secondary | ICD-10-CM | POA: Insufficient documentation

## 2013-08-01 DIAGNOSIS — Y9289 Other specified places as the place of occurrence of the external cause: Secondary | ICD-10-CM | POA: Insufficient documentation

## 2013-08-01 DIAGNOSIS — S63502A Unspecified sprain of left wrist, initial encounter: Secondary | ICD-10-CM

## 2013-08-01 DIAGNOSIS — S99929A Unspecified injury of unspecified foot, initial encounter: Secondary | ICD-10-CM

## 2013-08-01 DIAGNOSIS — I10 Essential (primary) hypertension: Secondary | ICD-10-CM | POA: Insufficient documentation

## 2013-08-01 DIAGNOSIS — S99919A Unspecified injury of unspecified ankle, initial encounter: Secondary | ICD-10-CM

## 2013-08-01 DIAGNOSIS — S63509A Unspecified sprain of unspecified wrist, initial encounter: Secondary | ICD-10-CM | POA: Insufficient documentation

## 2013-08-01 DIAGNOSIS — M79602 Pain in left arm: Secondary | ICD-10-CM

## 2013-08-01 DIAGNOSIS — W010XXA Fall on same level from slipping, tripping and stumbling without subsequent striking against object, initial encounter: Secondary | ICD-10-CM | POA: Insufficient documentation

## 2013-08-01 DIAGNOSIS — S6990XA Unspecified injury of unspecified wrist, hand and finger(s), initial encounter: Secondary | ICD-10-CM | POA: Insufficient documentation

## 2013-08-01 HISTORY — DX: Essential (primary) hypertension: I10

## 2013-08-01 NOTE — Discharge Instructions (Signed)
You make take acetaminophen and ibuprofen as needed for pain. Follow up with Dr. Kenton Kingfisher if not improving over the next week. You may use ice 2-3 times a day for up to 20 minutes at a time.  You may also use a heating pad or warm washcloth for pain relief.

## 2013-08-01 NOTE — ED Provider Notes (Signed)
CSN: [id number]     Arrival date & time 08/01/13  1450 History  This chart was scribed for non-physician practitioner, Noland Fordyce, PA-C working with Dot Lanes, MD by Frederich Balding, ED scribe. This patient was seen in room WTR7/WTR7 and the patient's care was started at 5:24 PM.   Chief Complaint  Patient presents with  . Fall  . Hand Pain  . Arm Injury    left  . Knee Injury   The history is provided by the patient. No language interpreter was used.   HPI Comments: [name] is a 79 y.o. male who presents to the Emergency Department complaining of a fall that occurred earlier today. Pt states he tripped over boxes at [facility] and landed on his left side. Denies hitting his head or LOC. Pt has mild left hand pain and left arm pain and left elbow pain. Rates the pain 3/10. Denies other injuries. Pt states he has some numbness in his left fingers. He has not taken any medications for pain. Denies shoulder pain, leg pain, knee pain. Pt states he has a past injury to his left hand where a piece of steele got into his hand. Pt is right hand dominant.   Past Medical History  Diagnosis Date  . Hypertension    Past Surgical History  Procedure Laterality Date  . Cardiac surgery     No family history on file. History  Substance Use Topics  . Smoking status: Never Smoker   . Smokeless tobacco: Not on file  . Alcohol Use: No    Review of Systems  Musculoskeletal: Positive for arthralgias and myalgias.  Neurological: Positive for numbness.  All other systems reviewed and are negative.   Allergies  Aspirin and Ramipril  Home Medications  No current outpatient prescriptions on file.  BP 156/71  Pulse 56  Temp(Src) 97.9 F (36.6 C) (Oral)  Resp 20  SpO2 98%  Physical Exam  Nursing note and vitals reviewed. Constitutional: He is oriented to person, place, and time. He appears well-developed and well-nourished.  HENT:  Head: Normocephalic and atraumatic.  Eyes: EOM  are normal.  Neck: Normal range of motion.  Cardiovascular: Normal rate.   Pulmonary/Chest: Effort normal.  Musculoskeletal: Normal range of motion.  Full ROM of left shoulder, elbow and wrist. 5/5 grip strength. No edema or ecchymosis. Skin intact. No pain in anatomical snuff box.   Neurological: He is alert and oriented to person, place, and time.  Skin: Skin is warm and dry.  Psychiatric: He has a normal mood and affect. His behavior is normal.    ED Course  Procedures (including critical care time)  DIAGNOSTIC STUDIES: Oxygen Saturation is 98% on RA, normal by my interpretation.    COORDINATION OF CARE: 5:29 PM-Discussed treatment plan which includes wrist splint, tylenol and ibuprofen as needed with pt at bedside and pt agreed to plan. Advised pt to follow up with his PCP if symptoms do not resolve in one week.   Labs Review Labs Reviewed - No data to display Imaging Review Dg Elbow Complete Left  08/01/2013   CLINICAL DATA:  [place].  Left elbow pain.  EXAM: LEFT ELBOW - COMPLETE 3+ VIEW  COMPARISON:  None.  FINDINGS: He joint spaces are maintained. Mild degenerative changes. No acute fracture or joint effusion. A long olecranon spurs noted. There also calcifications in the medial and lateral epicondyles.  IMPRESSION: No acute bony findings or joint effusion.   Electronically Signed   By: Elta Guadeloupe  Gallerani M.D.   On: 08/01/2013 17:11   Dg Wrist Complete Left  08/01/2013   CLINICAL DATA:  [place].  Wrist pain.  EXAM: LEFT WRIST - COMPLETE 3+ VIEW  COMPARISON:  None.  FINDINGS: There are Mild to moderate degenerative changes involving the wrist. No acute fractures identified. There is widening of the scapholunate joint space which appears stable since 2012 and may be due to a chronic tear or ligament insufficiency.  IMPRESSION: Moderate degenerative changes involving the wrist but no definite acute fracture.  Chronic appearing scapholunate joint space widening.   Electronically Signed   By:  Kalman Jewels M.D.   On: 08/01/2013 17:09   Dg Hand Complete Left  08/01/2013   CLINICAL DATA:  FALL.  GENERAL LEFT HAND PAIN.  EXAM: LEFT HAND - COMPLETE 3+ VIEW  COMPARISON:  10/11/2010  FINDINGS: There is no evidence for acute fracture. Note is made of subluxation at the first metacarpophalangeal joint, likely degenerative. Note is made of a metallic foreign body overlying the web space of the first and second metacarpals, stable. No soft tissue gas identified.  IMPRESSION: 1.  No evidence for acute osseous abnormality. 2. Stable metallic foreign body in the web space between the first and second metacarpals.   Electronically Signed   By: Shon Hale M.D.   On: 08/01/2013 17:13     EKG Interpretation None      MDM   Final diagnoses:  Fall from slip, trip, or stumble  Left arm pain  Left wrist sprain   pt presenting after trip and fall at [facility] earlier this evening. Denies hitting head or LOC. Pt c/o left arm pain, 3/10, denies any other injury. Skin is in tact. No ecchymosis or erythema.  Pt is tender around left hand and wrist but neurovascularly in tact and no tenderness in anatomical snuff box. Plain films: unremarkable.  Will place pt in wrist splint for likely wrist sprain.  Advised to use ice, tylenol and ibuprofen as needed for pain. Advised to f/u with PCP later this week if symptoms not improving. Pt verbalized understanding and agreement with tx plan.  I personally performed the services described in this documentation, which was scribed in my presence. The recorded information has been reviewed and is accurate.   Noland Fordyce, PA-C 08/02/13 1128

## 2013-08-01 NOTE — ED Notes (Signed)
Pt was at [facility] when he tripped over boxes left in the floor. Pt wants his left hand, arm and left leg checked out to make sure there is no injuries bc the store filed a report.

## 2013-08-01 NOTE — ED Notes (Signed)
Pt left prior to receiving discharge papers.

## 2013-08-05 NOTE — ED Provider Notes (Signed)
Medical screening examination/treatment/procedure(s) were performed by non-physician practitioner and as supervising physician I was immediately available for consultation/collaboration.   Dot Lanes, MD 08/05/13 1017

## 2013-09-08 ENCOUNTER — Ambulatory Visit (INDEPENDENT_AMBULATORY_CARE_PROVIDER_SITE_OTHER): Payer: Medicare Other | Admitting: Cardiology

## 2013-09-08 ENCOUNTER — Encounter: Payer: Self-pay | Admitting: Cardiology

## 2013-09-08 ENCOUNTER — Encounter (INDEPENDENT_AMBULATORY_CARE_PROVIDER_SITE_OTHER): Payer: Self-pay

## 2013-09-08 VITALS — BP 138/66 | HR 58 | Ht 66.0 in | Wt 187.4 lb

## 2013-09-08 DIAGNOSIS — E785 Hyperlipidemia, unspecified: Secondary | ICD-10-CM

## 2013-09-08 DIAGNOSIS — I498 Other specified cardiac arrhythmias: Secondary | ICD-10-CM

## 2013-09-08 DIAGNOSIS — R0989 Other specified symptoms and signs involving the circulatory and respiratory systems: Secondary | ICD-10-CM

## 2013-09-08 DIAGNOSIS — R001 Bradycardia, unspecified: Secondary | ICD-10-CM

## 2013-09-08 DIAGNOSIS — I359 Nonrheumatic aortic valve disorder, unspecified: Secondary | ICD-10-CM

## 2013-09-08 LAB — LIPID PANEL
CHOLESTEROL: 202 mg/dL — AB (ref 0–200)
HDL: 50.3 mg/dL (ref >39.00)
LDL Cholesterol: 140 mg/dL — ABNORMAL HIGH (ref 0–99)
Total CHOL/HDL Ratio: 4
Triglycerides: 61 mg/dL (ref 0.0–149.0)
VLDL: 12.2 mg/dL (ref 0.0–40.0)

## 2013-09-08 MED ORDER — ASPIRIN EC 81 MG PO TBEC: 81.0000 mg | DELAYED_RELEASE_TABLET | Freq: Every day | ORAL | Status: DC

## 2013-09-08 NOTE — Progress Notes (Signed)
HPI The patient returns for followup after several years. He has a history of CABG. However, he hasn't come back for followup but doesn't look like it taken many or any of his medications in years. He has had a limited understanding in the past. He was sent back by Dr. Moreen Fowler who is trying to reestablish some secondary prevention. He did have a BNP drawn which was only minimally elevated. Renal function was okay. I don't see a lipid profile. The patient doesn't complain of any chest pain, neck discomfort, arm discomfort. He doesn't report any shortness of breath, PND or orthopnea. He said he can do some yard work. He does have some lower extremity swelling which is right greater than left which he reports very mild.  Allergies  Allergen Reactions  . Aspirin   . Ramipril     No current outpatient prescriptions on file.   No current facility-administered medications for this visit.    Past Medical History  Diagnosis Date  . Hypertension   . Prostate ca     Seed implants  . Hyperlipidemia   . Peptic ulcer disease   . Internal hemorrhoids   . Carotid stenosis     Past Surgical History  Procedure Laterality Date  . Cardiac surgery  2003    SVG LAD, SVG to right coronary artery, SVG to marginal    Family History  Problem Relation Age of Onset  . Stroke Father     History   Social History  . Marital Status: Married    Spouse Name: N/A    Number of Children: N/A  . Years of Education: N/A   Occupational History  . Retired    Social History Main Topics  . Smoking status: Never Smoker   . Smokeless tobacco: Not on file  . Alcohol Use: No  . Drug Use: Not on file  . Sexual Activity: Not on file   Other Topics Concern  . Not on file   Social History Narrative  . No narrative on file    ROS:  As stated in the HPI and negative for all other systems.   PHYSICAL EXAM BP 138/66  Pulse 58  Ht 5\' 6"  (1.676 m)  Wt 187 lb 6.4 oz (85.004 kg)  BMI 30.26  kg/m2 GENERAL:  Well appearing HEENT:  Pupils equal round and reactive, fundi not visualized, oral mucosa unremarkable NECK:  No jugular venous distention, waveform within normal limits, carotid upstroke brisk and symmetric, no bruits, no thyromegaly LYMPHATICS:  No cervical, inguinal adenopathy LUNGS:  Clear to auscultation bilaterally BACK:  No CVA tenderness CHEST:  Well healed sternotomy scar. HEART:  PMI not displaced or sustained,S1 and S2 within normal limits, no S3, no S4, no clicks, no rubs, 2/6 apical systolic murmur early peaking at the apex.  Soft short diastolic murmurs ABD:  Flat, positive bowel sounds normal in frequency in pitch, no bruits, no rebound, no guarding, no midline pulsatile mass, positive abdominal bruit,  no hepatomegaly, no splenomegaly EXT:  2 plus pulses upper, decreased DP/PT bilateral lower, mild right greater than left edema, no cyanosis no clubbing SKIN:  No rashes no nodules NEURO:  Cranial nerves II through XII grossly intact, motor grossly intact throughout PSYCH:  Cognitively intact, oriented to person place and time   EKG:  Sinus rhythm, rate 52, leftward axis, inferolateral T wave inversions, these were more pronounced than previous.  2013-09-22  ASSESSMENT AND PLAN  CAD:  He doesn't have any symptoms and at  this point I am not planning a stress test. However, I will low threshold for a POET (Plain Old Exercise Treadmill) As he now has 77 year old bypass grafts.  I will restart ASA.  I think he was not on this because of a distant history of peptic ulcer disease and perhaps because of his previous history of internal hemorrhoids. Certainly we would stop this if he has bleeding.  RISK REDUCTION:  I will order a fasting lipid profile.  He had elevated CKs and liver enzymes on statin. He was from [place].  ABDOMINAL BRUIT:  I will order abdominal ultrasound.  CAROTID STENOSIS:  He has had mild stenosis in the past and is overdue for followup. I will  arrange this.  AORTIC INSUFFICIENCY:  I will order an echocardiogram.    SWELLING:  I will check the echo as above.

## 2013-09-08 NOTE — Patient Instructions (Addendum)
Please start Asprin 81 mg a day. Continue all other medications as listed.  Please have blood work today (lipid)  Your physician has requested that you have an abdominal aorta duplex. During this test, an ultrasound is used to evaluate the aorta. Allow 30 minutes for this exam. Do not eat after midnight the day before and avoid carbonated beverages  Your physician has requested that you have a carotid duplex. This test is an ultrasound of the carotid arteries in your neck. It looks at blood flow through these arteries that supply the brain with blood. Allow one hour for this exam. There are no restrictions or special instructions.  Your physician has requested that you have an echocardiogram. Echocardiography is a painless test that uses sound waves to create images of your heart. It provides your doctor with information about the size and shape of your heart and how well your heart's chambers and valves are working. This procedure takes approximately one hour. There are no restrictions for this procedure.  Follow up in 6 months with Dr Percival Spanish.  You will receive a letter in the mail 2 months before you are due.  Please call us when you receive this letter to schedule your follow up appointment.

## 2013-09-15 MED ORDER — EZETIMIBE 10 MG PO TABS: 10.0000 mg | ORAL_TABLET | Freq: Every day | ORAL | Status: DC

## 2013-09-15 NOTE — Addendum Note (Signed)
Addended by: Lynann Bologna on: 09/15/2013 10:16 AM   Modules accepted: Orders

## 2013-09-19 ENCOUNTER — Ambulatory Visit (HOSPITAL_BASED_OUTPATIENT_CLINIC_OR_DEPARTMENT_OTHER): Payer: Medicare Other | Admitting: Cardiology

## 2013-09-19 ENCOUNTER — Ambulatory Visit (HOSPITAL_COMMUNITY): Payer: Medicare Other | Attending: Cardiology | Admitting: Cardiology

## 2013-09-19 DIAGNOSIS — I700 Atherosclerosis of aorta: Secondary | ICD-10-CM

## 2013-09-19 DIAGNOSIS — I359 Nonrheumatic aortic valve disorder, unspecified: Secondary | ICD-10-CM | POA: Insufficient documentation

## 2013-09-19 DIAGNOSIS — R0989 Other specified symptoms and signs involving the circulatory and respiratory systems: Secondary | ICD-10-CM

## 2013-09-19 DIAGNOSIS — I6529 Occlusion and stenosis of unspecified carotid artery: Secondary | ICD-10-CM

## 2013-09-19 NOTE — Progress Notes (Signed)
Aorta and iliac duplex performed

## 2013-09-19 NOTE — Progress Notes (Signed)
Carotid duplex performed 

## 2013-09-20 ENCOUNTER — Ambulatory Visit (HOSPITAL_COMMUNITY): Payer: Medicare Other | Attending: Cardiovascular Disease | Admitting: Radiology

## 2013-09-20 DIAGNOSIS — I359 Nonrheumatic aortic valve disorder, unspecified: Secondary | ICD-10-CM

## 2013-09-20 NOTE — Progress Notes (Signed)
Echocardiogram performed.  

## 2013-09-27 ENCOUNTER — Encounter: Payer: BC Managed Care – PPO | Admitting: Cardiology

## 2013-10-20 NOTE — Progress Notes (Signed)
Patient ID: [name], male   DOB: 09/02/1934, 79 y.o.   MRN: [id number] Placed sample back in closet , was placed up front on 09/15/13

## 2015-01-27 ENCOUNTER — Other Ambulatory Visit (HOSPITAL_COMMUNITY): Payer: Medicare Other

## 2015-01-27 ENCOUNTER — Inpatient Hospital Stay (HOSPITAL_COMMUNITY)
Admission: EM | Admit: 2015-01-27 | Discharge: 2015-01-30 | DRG: 246 | Disposition: A | Discharge status: Home / Self Care | Payer: Medicare Other | Attending: Cardiology | Admitting: Cardiology

## 2015-01-27 ENCOUNTER — Emergency Department (HOSPITAL_COMMUNITY): Payer: Medicare Other

## 2015-01-27 ENCOUNTER — Encounter (HOSPITAL_COMMUNITY): Payer: Self-pay | Admitting: Emergency Medicine

## 2015-01-27 ENCOUNTER — Encounter (HOSPITAL_COMMUNITY)
Admission: EM | Disposition: A | Discharge status: Home / Self Care | Payer: Self-pay | Source: Home / Self Care | Attending: Cardiology

## 2015-01-27 DIAGNOSIS — F0390 Unspecified dementia without behavioral disturbance: Secondary | ICD-10-CM | POA: Diagnosis present

## 2015-01-27 DIAGNOSIS — J9811 Atelectasis: Secondary | ICD-10-CM | POA: Diagnosis present

## 2015-01-27 DIAGNOSIS — I25810 Atherosclerosis of coronary artery bypass graft(s) without angina pectoris: Secondary | ICD-10-CM | POA: Diagnosis present

## 2015-01-27 DIAGNOSIS — I252 Old myocardial infarction: Secondary | ICD-10-CM | POA: Diagnosis present

## 2015-01-27 DIAGNOSIS — I25700 Atherosclerosis of coronary artery bypass graft(s), unspecified, with unstable angina pectoris: Secondary | ICD-10-CM | POA: Diagnosis not present

## 2015-01-27 DIAGNOSIS — Z8546 Personal history of malignant neoplasm of prostate: Secondary | ICD-10-CM

## 2015-01-27 DIAGNOSIS — Z87891 Personal history of nicotine dependence: Secondary | ICD-10-CM | POA: Diagnosis not present

## 2015-01-27 DIAGNOSIS — I739 Peripheral vascular disease, unspecified: Secondary | ICD-10-CM

## 2015-01-27 DIAGNOSIS — I214 Non-ST elevation (NSTEMI) myocardial infarction: Principal | ICD-10-CM | POA: Diagnosis present

## 2015-01-27 DIAGNOSIS — R197 Diarrhea, unspecified: Secondary | ICD-10-CM | POA: Diagnosis present

## 2015-01-27 DIAGNOSIS — Z9119 Patient's noncompliance with other medical treatment and regimen: Secondary | ICD-10-CM | POA: Diagnosis present

## 2015-01-27 DIAGNOSIS — I5023 Acute on chronic systolic (congestive) heart failure: Secondary | ICD-10-CM | POA: Diagnosis present

## 2015-01-27 DIAGNOSIS — Z886 Allergy status to analgesic agent status: Secondary | ICD-10-CM | POA: Diagnosis not present

## 2015-01-27 DIAGNOSIS — Y92239 Unspecified place in hospital as the place of occurrence of the external cause: Secondary | ICD-10-CM | POA: Diagnosis not present

## 2015-01-27 DIAGNOSIS — Z8711 Personal history of peptic ulcer disease: Secondary | ICD-10-CM

## 2015-01-27 DIAGNOSIS — T502X5A Adverse effect of carbonic-anhydrase inhibitors, benzothiadiazides and other diuretics, initial encounter: Secondary | ICD-10-CM | POA: Diagnosis not present

## 2015-01-27 DIAGNOSIS — T82857A Stenosis of cardiac prosthetic devices, implants and grafts, initial encounter: Secondary | ICD-10-CM | POA: Diagnosis present

## 2015-01-27 DIAGNOSIS — I340 Nonrheumatic mitral (valve) insufficiency: Secondary | ICD-10-CM | POA: Diagnosis present

## 2015-01-27 DIAGNOSIS — I6523 Occlusion and stenosis of bilateral carotid arteries: Secondary | ICD-10-CM | POA: Diagnosis present

## 2015-01-27 DIAGNOSIS — R079 Chest pain, unspecified: Secondary | ICD-10-CM | POA: Diagnosis present

## 2015-01-27 DIAGNOSIS — I255 Ischemic cardiomyopathy: Secondary | ICD-10-CM | POA: Diagnosis present

## 2015-01-27 DIAGNOSIS — I10 Essential (primary) hypertension: Secondary | ICD-10-CM | POA: Diagnosis present

## 2015-01-27 DIAGNOSIS — E876 Hypokalemia: Secondary | ICD-10-CM | POA: Diagnosis not present

## 2015-01-27 DIAGNOSIS — E785 Hyperlipidemia, unspecified: Secondary | ICD-10-CM | POA: Diagnosis present

## 2015-01-27 DIAGNOSIS — I472 Ventricular tachycardia: Secondary | ICD-10-CM | POA: Diagnosis not present

## 2015-01-27 DIAGNOSIS — Y832 Surgical operation with anastomosis, bypass or graft as the cause of abnormal reaction of the patient, or of later complication, without mention of misadventure at the time of the procedure: Secondary | ICD-10-CM | POA: Diagnosis present

## 2015-01-27 DIAGNOSIS — R739 Hyperglycemia, unspecified: Secondary | ICD-10-CM | POA: Diagnosis present

## 2015-01-27 DIAGNOSIS — I779 Disorder of arteries and arterioles, unspecified: Secondary | ICD-10-CM | POA: Diagnosis present

## 2015-01-27 DIAGNOSIS — I2510 Atherosclerotic heart disease of native coronary artery without angina pectoris: Secondary | ICD-10-CM | POA: Diagnosis present

## 2015-01-27 DIAGNOSIS — I25118 Atherosclerotic heart disease of native coronary artery with other forms of angina pectoris: Secondary | ICD-10-CM | POA: Diagnosis present

## 2015-01-27 DIAGNOSIS — Z955 Presence of coronary angioplasty implant and graft: Secondary | ICD-10-CM

## 2015-01-27 DIAGNOSIS — R112 Nausea with vomiting, unspecified: Secondary | ICD-10-CM | POA: Diagnosis present

## 2015-01-27 HISTORY — DX: Ischemic cardiomyopathy: I25.5

## 2015-01-27 HISTORY — DX: Atherosclerotic heart disease of native coronary artery without angina pectoris: I25.10

## 2015-01-27 HISTORY — PX: CARDIAC CATHETERIZATION: SHX172

## 2015-01-27 HISTORY — DX: Unspecified dementia, unspecified severity, without behavioral disturbance, psychotic disturbance, mood disturbance, and anxiety: F03.90

## 2015-01-27 LAB — HEPATIC FUNCTION PANEL
ALK PHOS: 78 U/L (ref 38–126)
ALT: 23 U/L (ref 17–63)
AST: 87 U/L — ABNORMAL HIGH (ref 15–41)
Albumin: 3.7 g/dL (ref 3.5–5.0)
BILIRUBIN TOTAL: 1.1 mg/dL (ref 0.3–1.2)
Bilirubin, Direct: <0.1 mg/dL — ABNORMAL LOW (ref 0.1–0.5)
TOTAL PROTEIN: 7 g/dL (ref 6.5–8.1)

## 2015-01-27 LAB — COMPREHENSIVE METABOLIC PANEL
ALK PHOS: 76 U/L (ref 38–126)
ALT: 25 U/L (ref 17–63)
ANION GAP: 12 (ref 5–15)
AST: 125 U/L — ABNORMAL HIGH (ref 15–41)
Albumin: 3.5 g/dL (ref 3.5–5.0)
BILIRUBIN TOTAL: 1.1 mg/dL (ref 0.3–1.2)
BUN: 11 mg/dL (ref 6–20)
CALCIUM: 8.9 mg/dL (ref 8.9–10.3)
CO2: 23 mmol/L (ref 22–32)
Chloride: 103 mmol/L (ref 101–111)
Creatinine, Ser: 1.04 mg/dL (ref 0.61–1.24)
GFR calc non Af Amer: >60 mL/min (ref >60)
Glucose, Bld: 149 mg/dL — ABNORMAL HIGH (ref 65–99)
POTASSIUM: 4.1 mmol/L (ref 3.5–5.1)
SODIUM: 138 mmol/L (ref 135–145)
TOTAL PROTEIN: 7 g/dL (ref 6.5–8.1)

## 2015-01-27 LAB — TROPONIN I
Troponin I: 5.47 ng/mL (ref <0.031)
Troponin I: 21.15 ng/mL (ref <0.031)

## 2015-01-27 LAB — URINALYSIS, ROUTINE W REFLEX MICROSCOPIC
BILIRUBIN URINE: NEGATIVE
Glucose, UA: NEGATIVE mg/dL
KETONES UR: 40 mg/dL — AB
LEUKOCYTES UA: NEGATIVE
NITRITE: NEGATIVE
PH: 5.5 (ref 5.0–8.0)
PROTEIN: 30 mg/dL — AB
Specific Gravity, Urine: 1.023 (ref 1.005–1.030)
UROBILINOGEN UA: 0.2 mg/dL (ref 0.0–1.0)

## 2015-01-27 LAB — BASIC METABOLIC PANEL
ANION GAP: 9 (ref 5–15)
BUN: 12 mg/dL (ref 6–20)
CHLORIDE: 100 mmol/L — AB (ref 101–111)
CO2: 28 mmol/L (ref 22–32)
Calcium: 9.3 mg/dL (ref 8.9–10.3)
Creatinine, Ser: 1.2 mg/dL (ref 0.61–1.24)
GFR calc Af Amer: >60 mL/min (ref >60)
GFR, EST NON AFRICAN AMERICAN: 56 mL/min — AB (ref >60)
GLUCOSE: 154 mg/dL — AB (ref 65–99)
POTASSIUM: 4.5 mmol/L (ref 3.5–5.1)
Sodium: 137 mmol/L (ref 135–145)

## 2015-01-27 LAB — LIPASE, BLOOD: LIPASE: 30 U/L (ref 22–51)

## 2015-01-27 LAB — I-STAT TROPONIN, ED: Troponin i, poc: 17.4 ng/mL (ref 0.00–0.08)

## 2015-01-27 LAB — URINE MICROSCOPIC-ADD ON

## 2015-01-27 LAB — CBC
HEMATOCRIT: 43.1 % (ref 39.0–52.0)
HEMOGLOBIN: 14.4 g/dL (ref 13.0–17.0)
MCH: 28.6 pg (ref 26.0–34.0)
MCHC: 33.4 g/dL (ref 30.0–36.0)
MCV: 85.7 fL (ref 78.0–100.0)
Platelets: 190 10*3/uL (ref 150–400)
RBC: 5.03 MIL/uL (ref 4.22–5.81)
RDW: 14.1 % (ref 11.5–15.5)
WBC: 7.1 10*3/uL (ref 4.0–10.5)

## 2015-01-27 LAB — MAGNESIUM: MAGNESIUM: 1.7 mg/dL (ref 1.7–2.4)

## 2015-01-27 LAB — MRSA PCR SCREENING: MRSA BY PCR: NEGATIVE

## 2015-01-27 LAB — PROTIME-INR
INR: 1.13 (ref 0.00–1.49)
Prothrombin Time: 14.7 seconds (ref 11.6–15.2)

## 2015-01-27 LAB — T4, FREE: Free T4: 0.85 ng/dL (ref 0.61–1.12)

## 2015-01-27 LAB — APTT
APTT: 28 s (ref 24–37)
APTT: 96 s — AB (ref 24–37)

## 2015-01-27 LAB — TSH: TSH: 1.864 u[IU]/mL (ref 0.350–4.500)

## 2015-01-27 SURGERY — LEFT HEART CATH AND CORONARY ANGIOGRAPHY
Anesthesia: LOCAL

## 2015-01-27 MED ORDER — TICAGRELOR 90 MG PO TABS
ORAL_TABLET | ORAL | Status: AC
  Filled 2015-01-27: qty 1

## 2015-01-27 MED ORDER — ADENOSINE 6 MG/2ML IV SOLN
INTRAVENOUS | Status: AC
  Filled 2015-01-27: qty 2

## 2015-01-27 MED ORDER — TIROFIBAN (AGGRASTAT) BOLUS VIA INFUSION
INTRAVENOUS | Status: DC | PRN
  Administered 2015-01-27: 2040 ug via INTRAVENOUS

## 2015-01-27 MED ORDER — HEPARIN (PORCINE) IN NACL 100-0.45 UNIT/ML-% IJ SOLN
1000.0000 [IU]/h | INTRAMUSCULAR | Status: DC
  Administered 2015-01-27: 1000 [IU]/h via INTRAVENOUS
  Filled 2015-01-27: qty 250

## 2015-01-27 MED ORDER — TIROFIBAN HCL IV 5 MG/100ML
INTRAVENOUS | Status: DC | PRN
  Administered 2015-01-27: 0.15 ug/kg/min via INTRAVENOUS

## 2015-01-27 MED ORDER — SODIUM CHLORIDE 0.9 % IV SOLN: 250.0000 mL | INTRAVENOUS | Status: DC | PRN

## 2015-01-27 MED ORDER — NITROGLYCERIN IN D5W 200-5 MCG/ML-% IV SOLN
0.0000 ug/min | Freq: Once | INTRAVENOUS | Status: AC
  Administered 2015-01-27: 5 ug/min via INTRAVENOUS
  Filled 2015-01-27: qty 250

## 2015-01-27 MED ORDER — BIVALIRUDIN 250 MG IV SOLR
250.0000 mg | INTRAVENOUS | Status: DC | PRN
  Administered 2015-01-27: 1.75 mg/kg/h via INTRAVENOUS

## 2015-01-27 MED ORDER — NITROGLYCERIN 1 MG/10 ML FOR IR/CATH LAB
INTRA_ARTERIAL | Status: AC
  Filled 2015-01-27: qty 10

## 2015-01-27 MED ORDER — NITROGLYCERIN 0.4 MG SL SUBL: 0.4000 mg | SUBLINGUAL_TABLET | SUBLINGUAL | Status: DC | PRN

## 2015-01-27 MED ORDER — NITROGLYCERIN 1 MG/10 ML FOR IR/CATH LAB
INTRA_ARTERIAL | Status: DC | PRN
  Administered 2015-01-27: 18:00:00

## 2015-01-27 MED ORDER — METOPROLOL TARTRATE 1 MG/ML IV SOLN
INTRAVENOUS | Status: DC | PRN
  Administered 2015-01-27: 2.5 mg via INTRAVENOUS

## 2015-01-27 MED ORDER — LIDOCAINE HCL (PF) 1 % IJ SOLN
INTRAMUSCULAR | Status: AC
  Filled 2015-01-27: qty 30

## 2015-01-27 MED ORDER — NITROGLYCERIN 0.4 MG SL SUBL
0.4000 mg | SUBLINGUAL_TABLET | SUBLINGUAL | Status: AC | PRN
  Administered 2015-01-27 (×3): 0.4 mg via SUBLINGUAL
  Filled 2015-01-27: qty 1

## 2015-01-27 MED ORDER — SODIUM CHLORIDE 0.9 % IV BOLUS (SEPSIS)
500.0000 mL | Freq: Once | INTRAVENOUS | Status: AC
  Administered 2015-01-27: 500 mL via INTRAVENOUS

## 2015-01-27 MED ORDER — ONDANSETRON HCL 4 MG/2ML IJ SOLN: 4.0000 mg | Freq: Three times a day (TID) | INTRAMUSCULAR | Status: DC | PRN

## 2015-01-27 MED ORDER — ASPIRIN EC 81 MG PO TBEC
81.0000 mg | DELAYED_RELEASE_TABLET | Freq: Every day | ORAL | Status: DC
  Administered 2015-01-28 – 2015-01-29 (×2): 81 mg via ORAL
  Filled 2015-01-27 (×3): qty 1

## 2015-01-27 MED ORDER — ACETAMINOPHEN 325 MG PO TABS: 650.0000 mg | ORAL_TABLET | ORAL | Status: DC | PRN

## 2015-01-27 MED ORDER — ONDANSETRON HCL 4 MG/2ML IJ SOLN
4.0000 mg | Freq: Once | INTRAMUSCULAR | Status: AC
  Administered 2015-01-27: 4 mg via INTRAVENOUS
  Filled 2015-01-27: qty 2

## 2015-01-27 MED ORDER — CARVEDILOL 3.125 MG PO TABS
3.1250 mg | ORAL_TABLET | Freq: Two times a day (BID) | ORAL | Status: DC
  Administered 2015-01-27: 3.125 mg via ORAL
  Filled 2015-01-27: qty 1

## 2015-01-27 MED ORDER — METOPROLOL TARTRATE 1 MG/ML IV SOLN
INTRAVENOUS | Status: AC
  Filled 2015-01-27: qty 5

## 2015-01-27 MED ORDER — ONDANSETRON HCL 4 MG/2ML IJ SOLN: 4.0000 mg | Freq: Four times a day (QID) | INTRAMUSCULAR | Status: DC | PRN

## 2015-01-27 MED ORDER — FUROSEMIDE 10 MG/ML IJ SOLN
40.0000 mg | Freq: Once | INTRAMUSCULAR | Status: AC
  Administered 2015-01-27: 40 mg via INTRAVENOUS
  Filled 2015-01-27: qty 4

## 2015-01-27 MED ORDER — METOPROLOL TARTRATE 25 MG PO TABS
12.5000 mg | ORAL_TABLET | Freq: Two times a day (BID) | ORAL | Status: DC
  Administered 2015-01-27: 12.5 mg via ORAL
  Filled 2015-01-27: qty 1

## 2015-01-27 MED ORDER — CETYLPYRIDINIUM CHLORIDE 0.05 % MT LIQD
7.0000 mL | Freq: Two times a day (BID) | OROMUCOSAL | Status: DC
  Administered 2015-01-28 – 2015-01-29 (×3): 7 mL via OROMUCOSAL

## 2015-01-27 MED ORDER — TICAGRELOR 90 MG PO TABS
90.0000 mg | ORAL_TABLET | Freq: Two times a day (BID) | ORAL | Status: DC
  Administered 2015-01-28 – 2015-01-30 (×4): 90 mg via ORAL
  Filled 2015-01-27 (×5): qty 1

## 2015-01-27 MED ORDER — METOPROLOL TARTRATE 1 MG/ML IV SOLN
2.5000 mg | Freq: Once | INTRAVENOUS | Status: AC
  Administered 2015-01-27: 2.5 mg via INTRAVENOUS
  Filled 2015-01-27: qty 5

## 2015-01-27 MED ORDER — TIROFIBAN HCL IV 5 MG/100ML
INTRAVENOUS | Status: AC
  Filled 2015-01-27: qty 100

## 2015-01-27 MED ORDER — TICAGRELOR 90 MG PO TABS
ORAL_TABLET | ORAL | Status: DC | PRN
  Administered 2015-01-27: 180 mg via ORAL

## 2015-01-27 MED ORDER — HEPARIN BOLUS VIA INFUSION
4000.0000 [IU] | Freq: Once | INTRAVENOUS | Status: AC
  Administered 2015-01-27: 4000 [IU] via INTRAVENOUS
  Filled 2015-01-27: qty 4000

## 2015-01-27 MED ORDER — NITROGLYCERIN IN D5W 200-5 MCG/ML-% IV SOLN
0.0000 ug/min | INTRAVENOUS | Status: DC
  Administered 2015-01-27: 30 ug/min via INTRAVENOUS

## 2015-01-27 MED ORDER — VERAPAMIL HCL 2.5 MG/ML IV SOLN
INTRAVENOUS | Status: AC
  Filled 2015-01-27: qty 2

## 2015-01-27 MED ORDER — BIVALIRUDIN BOLUS VIA INFUSION - CUPID
INTRAVENOUS | Status: DC | PRN
  Administered 2015-01-27: 61.2 mg via INTRAVENOUS

## 2015-01-27 MED ORDER — TIROFIBAN HCL IV 5 MG/100ML
0.1500 ug/kg/min | INTRAVENOUS | Status: AC
  Administered 2015-01-27: 0.15 ug/kg/min via INTRAVENOUS
  Filled 2015-01-27: qty 100

## 2015-01-27 MED ORDER — SODIUM CHLORIDE 0.9 % WEIGHT BASED INFUSION: 1.0000 mL/kg/h | INTRAVENOUS | Status: DC

## 2015-01-27 MED ORDER — LOSARTAN POTASSIUM 25 MG PO TABS
25.0000 mg | ORAL_TABLET | Freq: Every day | ORAL | Status: DC
  Administered 2015-01-27: 25 mg via ORAL
  Filled 2015-01-27: qty 1

## 2015-01-27 MED ORDER — HEPARIN (PORCINE) IN NACL 2-0.9 UNIT/ML-% IJ SOLN
INTRAMUSCULAR | Status: AC
  Filled 2015-01-27: qty 1000

## 2015-01-27 MED ORDER — METOPROLOL TARTRATE 25 MG PO TABS: 25.0000 mg | ORAL_TABLET | Freq: Two times a day (BID) | ORAL | Status: DC

## 2015-01-27 MED ORDER — SODIUM CHLORIDE 0.9 % IJ SOLN
3.0000 mL | Freq: Two times a day (BID) | INTRAMUSCULAR | Status: DC
  Administered 2015-01-27 – 2015-01-30 (×5): 3 mL via INTRAVENOUS

## 2015-01-27 MED ORDER — ATORVASTATIN CALCIUM 80 MG PO TABS
80.0000 mg | ORAL_TABLET | Freq: Every day | ORAL | Status: DC
  Administered 2015-01-27 – 2015-01-29 (×3): 80 mg via ORAL
  Filled 2015-01-27 (×3): qty 1

## 2015-01-27 MED ORDER — SODIUM CHLORIDE 0.9 % IJ SOLN: 3.0000 mL | INTRAMUSCULAR | Status: DC | PRN

## 2015-01-27 MED ORDER — BIVALIRUDIN 250 MG IV SOLR
INTRAVENOUS | Status: AC
  Filled 2015-01-27: qty 250

## 2015-01-27 SURGICAL SUPPLY — 22 items
BALLN EMERGE MR 2.5X15 (BALLOONS) ×2
BALLN ~~LOC~~ EUPHORA RX 3.0X15 (BALLOONS) ×2
BALLOON EMERGE MR 2.5X15 (BALLOONS) ×1 IMPLANT
BALLOON ~~LOC~~ EUPHORA RX 3.0X15 (BALLOONS) ×1 IMPLANT
CATH INFINITI 5FR JL5 (CATHETERS) ×2 IMPLANT
CATH INFINITI 5FR MULTPACK ANG (CATHETERS) ×2 IMPLANT
CATH VISTA GUIDE 6FR XBLAD4.5 (CATHETERS) ×2 IMPLANT
DEVICE SPIDERFX EMB PROT 5MM (WIRE) ×2 IMPLANT
GUIDE CATH RUNWAY 6FR AL 1 (CATHETERS) ×2 IMPLANT
KIT ENCORE 26 ADVANTAGE (KITS) ×2 IMPLANT
KIT HEART LEFT (KITS) ×2 IMPLANT
PACK CARDIAC CATHETERIZATION (CUSTOM PROCEDURE TRAY) ×2 IMPLANT
SHEATH PINNACLE 6F 10CM (SHEATH) ×2 IMPLANT
STENT SYNERGY DES 2.75X20 (Permanent Stent) ×2 IMPLANT
STENT SYNERGY DES 2.75X24 (Permanent Stent) ×2 IMPLANT
STENT SYNERGY DES 4X16 (Permanent Stent) ×2 IMPLANT
SYR MEDRAD MARK V 150ML (SYRINGE) ×2 IMPLANT
TRANSDUCER W/STOPCOCK (MISCELLANEOUS) ×2 IMPLANT
TUBING CIL FLEX 10 FLL-RA (TUBING) ×2 IMPLANT
WIRE EMERALD 3MM-J .035X150CM (WIRE) ×2 IMPLANT
WIRE INTUITION HYDRO ST. 180CM (WIRE) ×2 IMPLANT
WIRE RUNTHROUGH .014X180CM (WIRE) ×2 IMPLANT

## 2015-01-27 NOTE — Progress Notes (Addendum)
    Notified of troponin point-of-care of 17. We will go ahead and order serum troponins. He is currently chest pain-free. He is more alert when talking to ER provider. If symptoms return, or if blood pressure begins to deteriorate, we will proceed any more urgent fashion to the cardiac catheterization lab. He has received aspirin without difficulty. I will wait on Plavix until anatomy is defined in case he needs repeat bypass surgery.  Candee Furbish, MD

## 2015-01-27 NOTE — ED Provider Notes (Signed)
Patient with prolonged time in the ED. NSTEMI.  EKG repeated troponin repeated EKG without significant changes from previous.   EKG Interpretation  Date/Time:  Saturday January 27 2015 12:28:42 EDT Ventricular Rate:  89 PR Interval:  177 QRS Duration: 99 QT Interval:  404 QTC Calculation: 492 R Axis:   -16 Text Interpretation:  Sinus rhythm Probable anterior infarct, age indeterminate Lateral leads are also involved No significant change since last tracing Confirmed by Baneen Wieseler MD, DANIEL [phone]) on 01/27/2015 12:33:14 PM      Patient with worsening troponin while in the ED. Discussed with cardiology will take to the Cath Lab.  Started on nitro gtt.   CRITICAL CARE Performed by: Cecilio Asper   Total critical care time: 30 min   Critical care time was exclusive of separately billable procedures and treating other patients.  Critical care was necessary to treat or prevent imminent or life-threatening deterioration.  Critical care was time spent personally by me on the following activities: development of treatment plan with patient and/or surrogate as well as nursing, discussions with consultants, evaluation of patient's response to treatment, examination of patient, obtaining history from patient or surrogate, ordering and performing treatments and interventions, ordering and review of laboratory studies, ordering and review of radiographic studies, pulse oximetry and re-evaluation of patient's condition.  The patients results and plan were reviewed and discussed.   Any x-rays performed were independently reviewed by myself.   Differential diagnosis were considered with the presenting HPI.  Medications  heparin ADULT infusion 100 units/mL (25000 units/250 mL) (1,000 Units/hr Intravenous Transfusing/Transfer 01/27/15 1528)  atorvastatin (LIPITOR) tablet 80 mg (80 mg Oral Given 01/27/15 1001)  aspirin EC tablet 81 mg (81 mg Oral Not Given 01/27/15 0925)  nitroGLYCERIN  (NITROSTAT) SL tablet 0.4 mg (not administered)  acetaminophen (TYLENOL) tablet 650 mg (not administered)  metoprolol tartrate (LOPRESSOR) tablet 25 mg (not administered)  ondansetron (ZOFRAN) injection 4 mg (not administered)  sodium chloride 0.9 % bolus 500 mL (0 mLs Intravenous Stopped 01/27/15 0900)  ondansetron (ZOFRAN) injection 4 mg (4 mg Intravenous Given 01/27/15 0646)  nitroGLYCERIN (NITROSTAT) SL tablet 0.4 mg (0.4 mg Sublingual Given 01/27/15 0742)  heparin bolus via infusion 4,000 Units (4,000 Units Intravenous Given 01/27/15 0751)  metoprolol (LOPRESSOR) injection 2.5 mg (2.5 mg Intravenous Given 01/27/15 0849)  nitroGLYCERIN 50 mg in dextrose 5 % 250 mL (0.2 mg/mL) infusion (5 mcg/min Intravenous Transfusing/Transfer 01/27/15 1528)    Filed Vitals:   01/27/15 1400 01/27/15 1415 01/27/15 1430 01/27/15 1445  BP: 160/83 155/74 165/80 161/68  Pulse: 88 93 88 94  Resp: 17 23 25 24   Height:      Weight:      SpO2: 91% 95% 94% 93%    Final diagnoses:  None    Admission/ observation were discussed with the admitting physician, patient and/or family and they are comfortable with the plan.    Deno Etienne, DO 01/27/15 1529

## 2015-01-27 NOTE — Progress Notes (Signed)
Orthopedic Tech Progress Note Patient Details:  [name] Aug 01, 1934 [id number]  Ortho Devices Type of Ortho Device: Knee Immobilizer Ortho Device/Splint Interventions: Application   Irish Elders 01/27/2015, 7:54 PM

## 2015-01-27 NOTE — Progress Notes (Signed)
Received page of urgent non-STEMI in Cath Lab, referencing Dr. Marlou Porch. Looked in Cath Lab, then ED, then Cardio, finally found Dr. Marlou Porch who said patient was in ED B-19 and was about to have cath. He had not paged me. Saw patient, who prayed with me and recounted how important his faith in [place] is to him and his family. Wished him well and promised prayer as he went to cath.

## 2015-01-27 NOTE — Consult Note (Signed)
Family Medicine Teaching Service Consult Note Service Pager: [phone]  Patient name: [name] Medical record number: [id number] Date of birth: 1934-08-24 Age: 80 y.o. Gender: male  Primary Care Ishmail Mcmanamon: Shirline Frees, MD Admitting Service: Cardiology Code Status: Full  Chief Complaint: NSTEMI Consult: Consulted by Cardiology for management of Nausea/Vomiting/Diarrhea  Assessment and Plan: Reba Fuda is a 80 y.o. male presenting with chest "ache" and pressure, nausea, vomiting, diarrhea. PMH is significant for coronary artery disease s/p bypass surgery in 2003, HTN, HLD.   # Nausea/Vomiting/Diarrhea: May be symptom of NSTEMI or cause of NSTEMI (gastroenteritis > inflammatory response > plaque rupture > NSTEMI). CBC with WBC 7.1. Creatinine 1.2, which is baseline. Zofran 4mg  given in ED. - Resolved. Denies any symptoms at this time - Caution with anti-emetics given QT prolongation - Zofran 4mg  PRN x2 doses. Phenergan contraindicated given NSTEMI.  - Continue to monitor  # NSTEMI: EKG with nonspecific ST depression and T wave inversion, mostly in inferolateral leads; unchanged from previous. CXR with bibasilar atelectasis/scarring. Troponin elevated to 5.47 Suspected to have started on Thursday (9/15). Note allergy to Aspirin. History of CAD s/p bypass in 2003. Last Echo in 09/2013 with EF 50-55%, mild aortic stenosis, moderate aortic regurgitation, moderately dilated right ventricle. - Treatment per primary team, Cardiology. Summarized below: - Trend troponin - IV Heparin - Nitroglycerin PRN - Follow up Echo - EKG daily - Cardiac catheterization planned for Monday (9/19) unless symptoms worsen  # Hypertension:  Elevated at 144-178 /  70-100. S/P 2.5mg  of IV Metoprolol in ED. Denies home medication. - Metoprolol 12.5mg  BID - Continue to monitor  # Hyperlipidemia: Denies home medication. Lipid panel in 08/2013 with cholesterol 202, triglycerides 61, HDL 50, LDL 140. - Lipitor  80mg  - Recheck lipid panel  # Hyperglycemia:  No history of diabetes. Glucose elevated to 154 and 149 in ED.  - Check A1C   # QT Prolongation: QTc noted to be 523 on EKG from today - Caution with medications that can prolong QT - Continue to monitor on daily EKG  FEN/GI: Heart Healthy Diet Prophylaxis: Heparin Drip  Disposition: Admit to Cardiology Service. Family Medicine consulting.  History of Present Illness:  [name] is a 80 y.o. male presenting with chest pain and pressure, nausea, vomiting, and diarrhea. Notes left sided chest pressure x2-3 days. Notes this started on Thursday (9/15) when he was working in the yard. Was initially intermittent and resolved with rest, however became constant on Friday (9/16). Denies radiation or diaphoresis.  Has noted nausea, vomiting, and diarrhea since yesterday, however this has resolved. Denies any symptoms of nausea, vomiting, or diarrhea since early this morning. Denies any sick contacts. Troponin in ED noted to be elevated to 5.47. Cardiology contacted for admission.  Review Of Systems: Per HPI  Otherwise 12 point review of systems was performed and was unremarkable.  Patient Active Problem List   Diagnosis Date Noted  . Non-ST elevation myocardial infarction (NSTEMI) 01/27/2015  . Essential hypertension 01/27/2015  . Hyperlipidemia 01/27/2015  . Nausea, vomiting and diarrhea 01/27/2015  . CAD (coronary artery disease) of artery bypass graft 01/27/2015  . NSTEMI (non-ST elevated myocardial infarction) 01/27/2015  . Abdominal bruit 09/08/2013  . AORTIC INSUFFICIENCY 03/23/2009  . PROSTATE CANCER 03/22/2009  . HYPERLIPIDEMIA 03/22/2009  . HYPERTENSION 03/22/2009  . CAD 03/22/2009  . CAROTID STENOSIS 03/22/2009   Past Medical History: Past Medical History  Diagnosis Date  . Hypertension   . Prostate ca     Seed implants  .  Hyperlipidemia   . Peptic ulcer disease   . Internal hemorrhoids   . Carotid stenosis    Past  Surgical History: Past Surgical History  Procedure Laterality Date  . Cardiac surgery  2003    SVG LAD, SVG to right coronary artery, SVG to marginal   Social History: Social History  Substance Use Topics  . Smoking status: Former Research scientist (life sciences)  . Smokeless tobacco: None  . Alcohol Use: No   Please also refer to relevant sections of EMR.  Family History: Family History  Problem Relation Age of Onset  . Stroke Father    Allergies and Medications: Allergies  Allergen Reactions  . Aspirin   . Ramipril    No current facility-administered medications on file prior to encounter.   Current Outpatient Prescriptions on File Prior to Encounter  Medication Sig Dispense Refill  . aspirin EC 81 MG tablet Take 1 tablet (81 mg total) by mouth daily. (Patient not taking: Reported on 01/27/2015) 90 tablet 3  . ezetimibe (ZETIA) 10 MG tablet Take 1 tablet (10 mg total) by mouth daily. (Patient not taking: Reported on 01/27/2015) 31 tablet 3   Objective: BP 163/88 mmHg  Pulse 86  Resp 25  SpO2 98% Exam: General: 80yo male resting comfortably in no apparent distress Eyes: PERRLA, white sclera ENTM: Dry mucous membranes Neck: Supple, No lymphadenopathy Cardiovascular: S1 and S2 noted, no murmurs/rubs/gallops, regular rate and rhythm, tenderness with palpation of left chest Respiratory: Clear to auscultation bilaterally, no wheezes, no increased work of breathing Abdomen: bowel sounds noted, soft and nondistended, nontender to palpation MSK: Mild edema noted Skin: No rashes noted, warm Neuro: Alert, Oriented, No focal deficits  Labs and Imaging: CBC BMET   Recent Labs Lab 01/27/15 0631  WBC 7.1  HGB 14.4  HCT 43.1  PLT 190    Recent Labs Lab 01/27/15 0631  NA 137  K 4.5  CL 100*  CO2 28  BUN 12  CREATININE 1.20  GLUCOSE 154*  CALCIUM 9.3     Dg Chest 2 View  01/27/2015   CLINICAL DATA:  LEFT-sided chest pain for 2 days, now worsening. Vomiting. Chest pressure. History of  hypertension, prostate cancer, peptic ulcer disease.  EXAM: CHEST  2 VIEW  COMPARISON:  None available for comparison at time of study interpretation.  FINDINGS: The cardiac silhouette is mildly enlarged, status post median sternotomy for CABG. Tortuous aorta. Mildly increased lung volumes with flattened hemidiaphragm. Bibasilar atelectasis/ scarring, pleural thickening LEFT lung base without pleural effusion or focal consolidation. No pneumothorax. Soft tissue planes and included osseous structures are nonsuspicious, mild degenerative change of the thoracic spine.  IMPRESSION: Mild cardiomegaly. Findings of COPD with bibasilar atelectasis/scarring.   Electronically Signed   By: Elon Alas M.D.   On: 01/27/2015 06:45   Lorna Few, DO 01/27/2015, 9:39 AM PGY-2, [place] Intern pager: [phone], text pages welcome

## 2015-01-27 NOTE — Progress Notes (Signed)
eLink Physician-Brief Progress Note Patient Name: [name] DOB: 24-Feb-1935 MRN: [id number]   Date of Service  01/27/2015  HPI/Events of Note  80 y.o. Male w/ NSTEMI s/p stent placement. On Nitro gtt. SCDs in place. Elevated BP.  eICU Interventions  Remains in ICU.     Intervention Category Evaluation Type: New Patient Evaluation  Tera Partridge 01/27/2015, 8:54 PM

## 2015-01-27 NOTE — ED Notes (Signed)
Pt called out reporting nausea.

## 2015-01-27 NOTE — Consult Note (Addendum)
History and physical  Admit date: 01/27/2015 Referring Physician Dr. Randal Buba Primary Physician Shirline Frees, MD Primary Cardiologist  Dr. Percival Spanish Reason for Consultation  NSTEMI  HPI: 80 year old male with coronary artery disease status post bypass surgery in 2003 with hypertension, hyperlipidemia, medical noncompliance who was last seen in the office on 09/08/13 by Dr. Percival Spanish here presented with nausea and vomiting for the past 3-4 days with associated diarrhea. According to previous office note he had not been back for follow-up and it does not look like he had taken any of his medications for years. He had limited understanding and his past. EKG previously showed ST segment depression and T-wave inversion nonspecifically. Mostly in the inferolateral leads noted. This is unchanged from current EKG.  Had approximately 2-3 days of chest ache, pressure. He was working out in the yard, rated it moderate in intensity perhaps with some shortness of breath. Interestingly however it seemed to be worse with inspiration as well as palpation. The pain is been constant. He also had vomiting and a few episodes of diarrhea after a church function. He felt weakened and needed assistance getting to his car. His pastor road with him.  Earlier today he woke up, had worsening vomiting did not feel well and came to the emergency room where a troponin was drawn and was elevated at 5.  His wife is currently at bedside. I do admit, it is a little challenging to get a clear timeline of events. He however is laying in bed, does not complain of any active chest pain but seems difficult to keep his eyes open. Perhaps he is exhausted from not getting much sleep.      PMH:   Past Medical History  Diagnosis Date  . Hypertension   . Prostate ca     Seed implants  . Hyperlipidemia   . Peptic ulcer disease   . Internal hemorrhoids   . Carotid stenosis     PSH:   Past Surgical History  Procedure Laterality  Date  . Cardiac surgery  2003    SVG LAD, SVG to right coronary artery, SVG to marginal   Allergies:  Aspirin and Ramipril Prior to Admit Meds:   Prior to Admission medications   Medication Sig Start Date End Date Taking? Authorizing Provider  aspirin EC 81 MG tablet Take 1 tablet (81 mg total) by mouth daily. 09/08/13   [name], MD  ezetimibe (ZETIA) 10 MG tablet Take 1 tablet (10 mg total) by mouth daily. 09/15/13   [name], MD   Fam HX:    Family History  Problem Relation Age of Onset  . Stroke Father    Social HX:    Social History   Social History  . Marital Status: Married    Spouse Name: N/A  . Number of Children: N/A  . Years of Education: N/A   Occupational History  . Retired    Social History Main Topics  . Smoking status: Former Research scientist (life sciences)  . Smokeless tobacco: Not on file  . Alcohol Use: No  . Drug Use: Not on file  . Sexual Activity: Not on file   Other Topics Concern  . Not on file   Social History Narrative     ROS:  All 11 ROS were addressed and are negative except what is stated in the HPI   Physical Exam: Blood pressure 144/70, pulse 78, resp. rate 21, SpO2 92 %.   General: Well developed, well nourished, lethargic, tired appearing, no acute distress  Head: Eyes PERRLA, No xanthomas.   Normal cephalic and atramatic  Lungs:   Clear bilaterally to auscultation and percussion. Normal respiratory effort. No wheezes, no rales.CABG scar Heart:   HRRR S1 S2 Pulses are 2+ & equal. Soft systolic  murmur, rubs, gallops.  No carotid bruit. No JVD.  No abdominal bruits.  Abdomen: Bowel sounds are positive, abdomen soft and non-tender without masses. No hepatosplenomegaly. Msk:  Back normal. Normal strength and tone for age. Extremities:  No clubbing, cyanosis or edema.  DP +1 Neuro: Alert and oriented X 3, non-focal, MAE x 4 GU: Deferred Rectal: Deferred Psych:  Quite somnolent currently      Labs: Lab Results  Component Value Date   WBC  7.1 01/27/2015   HGB 14.4 01/27/2015   HCT 43.1 01/27/2015   MCV 85.7 01/27/2015   PLT 190 01/27/2015     Recent Labs Lab 01/27/15 0631  NA 137  K 4.5  CL 100*  CO2 28  BUN 12  CREATININE 1.20  CALCIUM 9.3  GLUCOSE 154*    Recent Labs  01/27/15 0631  TROPONINI 5.47*   Lab Results  Component Value Date   CHOL 202* 09/08/2013   HDL 50.30 09/08/2013   LDLCALC 140* 09/08/2013   TRIG 61.0 09/08/2013   No results found for: DDIMER   Radiology:  Dg Chest 2 View  01/27/2015   CLINICAL DATA:  LEFT-sided chest pain for 2 days, now worsening. Vomiting. Chest pressure. History of hypertension, prostate cancer, peptic ulcer disease.  EXAM: CHEST  2 VIEW  COMPARISON:  None available for comparison at time of study interpretation.  FINDINGS: The cardiac silhouette is mildly enlarged, status post median sternotomy for CABG. Tortuous aorta. Mildly increased lung volumes with flattened hemidiaphragm. Bibasilar atelectasis/ scarring, pleural thickening LEFT lung base without pleural effusion or focal consolidation. No pneumothorax. Soft tissue planes and included osseous structures are nonsuspicious, mild degenerative change of the thoracic spine.  IMPRESSION: Mild cardiomegaly. Findings of COPD with bibasilar atelectasis/scarring.   Electronically Signed   By: Elon Alas M.D.   On: 01/27/2015 06:45   Personally viewed.  EKG:  Inferolateral ST segment changes, T-wave inversion, no significant change from prior EKG last year. J-point elevation noted in the anterior leads, unchanged. Personally viewed.   Echo: 09/20/13-EF 50-55%, mild aortic stenosis, moderate aortic regurgitation. Moderately dilated right ventricle. The aortic valve gradient was 13 mmHg.  ASSESSMENT/PLAN:    80 year old male with coronary artery disease status post bypass surgery in 2003 with non-ST elevation myocardial infarction, recent vomiting, diarrhea, lethargy.  1. Non-ST elevation myocardial infarction  -  This event seemed to have started on approximately Thursday. Troponin now 5. EKG is abnormal however unchanged from prior EKG last year. It would not be unreasonable to begin IV heparin. Aspirin allergy noted. Statin. Beta blocker as tolerated. Ultimately, cardiac catheterization on Monday should take place unless symptoms worsen or become more progressive. Discussed with wife. There is no significant widening of mediastinum on x-ray. We will check echo.  2. Nausea vomiting diarrhea lethargy-this could be associated solely with non-ST elevation myocardial infarction or perhaps he developed a gastroenteritis with subsequent inflammatory response which launched plaque rupture and non-ST elevation myocardial infarction. White count currently 7.1. Creatinine 1.2. Cholesterol 140. I appreciate hospitalist admission to ensure that he does not have any other underlying systemic process that may have driven this current non-ST elevation myocardial infarction given his associated GI symptoms.  3. Hyperlipidemia-resume statin.  4. Coronary artery disease-post bypass.  2003. See grafts as above.  Candee Furbish, MD  01/27/2015  7:51 AM

## 2015-01-27 NOTE — ED Notes (Addendum)
Critical 21.15 Troponin, Dr. Marlou Porch paged.

## 2015-01-27 NOTE — H&P (Signed)
History and physical  Admit date: 01/27/2015 Referring Physician Dr. Randal Buba Primary Physician Shirline Frees, MD Primary Cardiologist  Dr. Percival Spanish Reason for Consultation  NSTEMI  HPI: 80 year old male with coronary artery disease status post bypass surgery in 2003 with hypertension, hyperlipidemia, medical noncompliance who was last seen in the office on 09/08/13 by Dr. Percival Spanish here presented with nausea and vomiting for the past 3-4 days with associated diarrhea. According to previous office note he had not been back for follow-up and it does not look like he had taken any of his medications for years. He had limited understanding and his past. EKG previously showed ST segment depression and T-wave inversion nonspecifically. Mostly in the inferolateral leads noted. This is unchanged from current EKG.  Had approximately 2-3 days of chest ache, pressure. He was working out in the yard, rated it moderate in intensity perhaps with some shortness of breath. Interestingly however it seemed to be worse with inspiration as well as palpation. The pain is been constant. He also had vomiting and a few episodes of diarrhea after a church function. He felt weakened and needed assistance getting to his car. His pastor road with him.  Earlier today he woke up, had worsening vomiting did not feel well and came to the emergency room where a troponin was drawn and was elevated at 5.  His wife is currently at bedside. I do admit, it is a little challenging to get a clear timeline of events. He however is laying in bed, does not complain of any active chest pain but seems difficult to keep his eyes open. Perhaps he is exhausted from not getting much sleep.      PMH:   Past Medical History  Diagnosis Date  . Hypertension   . Prostate ca     Seed implants  . Hyperlipidemia   . Peptic ulcer disease   . Internal hemorrhoids   . Carotid stenosis     PSH:   Past Surgical History  Procedure  Laterality Date  . Cardiac surgery  2003    SVG LAD, SVG to right coronary artery, SVG to marginal   Allergies:  Aspirin and Ramipril Prior to Admit Meds:   Prior to Admission medications   Medication Sig Start Date End Date Taking? Authorizing Provider  aspirin EC 81 MG tablet Take 1 tablet (81 mg total) by mouth daily. 09/08/13   [name], MD  ezetimibe (ZETIA) 10 MG tablet Take 1 tablet (10 mg total) by mouth daily. 09/15/13   [name], MD   Fam HX:    Family History  Problem Relation Age of Onset  . Stroke Father    Social HX:    Social History   Social History  . Marital Status: Married    Spouse Name: N/A  . Number of Children: N/A  . Years of Education: N/A   Occupational History  . Retired    Social History Main Topics  . Smoking status: Former Research scientist (life sciences)  . Smokeless tobacco: Not on file  . Alcohol Use: No  . Drug Use: Not on file  . Sexual Activity: Not on file   Other Topics Concern  . Not on file   Social History Narrative     ROS:  All 11 ROS were addressed and are negative except what is stated in the HPI   Physical Exam: Blood pressure 144/70, pulse 78, resp. rate 21, SpO2 92 %.   General: Well developed, well nourished, lethargic, tired appearing,  no acute distress Head: Eyes PERRLA, No xanthomas.   Normal cephalic and atramatic  Lungs:   Clear bilaterally to auscultation and percussion. Normal respiratory effort. No wheezes, no rales.CABG scar Heart:   HRRR S1 S2 Pulses are 2+ & equal. Soft systolic  murmur, rubs, gallops.  No carotid bruit. No JVD.  No abdominal bruits.  Abdomen: Bowel sounds are positive, abdomen soft and non-tender without masses. No hepatosplenomegaly. Msk:  Back normal. Normal strength and tone for age. Extremities:  No clubbing, cyanosis or edema.  DP +1 Neuro: Alert and oriented X 3, non-focal, MAE x 4 GU: Deferred Rectal: Deferred Psych:  Quite somnolent currently      Labs: Lab Results  Component Value  Date   WBC 7.1 01/27/2015   HGB 14.4 01/27/2015   HCT 43.1 01/27/2015   MCV 85.7 01/27/2015   PLT 190 01/27/2015     Recent Labs Lab 01/27/15 0631  NA 137  K 4.5  CL 100*  CO2 28  BUN 12  CREATININE 1.20  CALCIUM 9.3  GLUCOSE 154*    Recent Labs  01/27/15 0631  TROPONINI 5.47*   Lab Results  Component Value Date   CHOL 202* 09/08/2013   HDL 50.30 09/08/2013   LDLCALC 140* 09/08/2013   TRIG 61.0 09/08/2013   No results found for: DDIMER   Radiology:  Dg Chest 2 View  01/27/2015   CLINICAL DATA:  LEFT-sided chest pain for 2 days, now worsening. Vomiting. Chest pressure. History of hypertension, prostate cancer, peptic ulcer disease.  EXAM: CHEST  2 VIEW  COMPARISON:  None available for comparison at time of study interpretation.  FINDINGS: The cardiac silhouette is mildly enlarged, status post median sternotomy for CABG. Tortuous aorta. Mildly increased lung volumes with flattened hemidiaphragm. Bibasilar atelectasis/ scarring, pleural thickening LEFT lung base without pleural effusion or focal consolidation. No pneumothorax. Soft tissue planes and included osseous structures are nonsuspicious, mild degenerative change of the thoracic spine.  IMPRESSION: Mild cardiomegaly. Findings of COPD with bibasilar atelectasis/scarring.   Electronically Signed   By: Elon Alas M.D.   On: 01/27/2015 06:45   Personally viewed.  EKG:  Inferolateral ST segment changes, T-wave inversion, no significant change from prior EKG last year. J-point elevation noted in the anterior leads, unchanged. Personally viewed.   Echo: 09/20/13-EF 50-55%, mild aortic stenosis, moderate aortic regurgitation. Moderately dilated right ventricle. The aortic valve gradient was 13 mmHg.  ASSESSMENT/PLAN:    80 year old male with coronary artery disease status post bypass surgery in 2003 with non-ST elevation myocardial infarction, recent vomiting, diarrhea, lethargy.  1. Non-ST elevation myocardial  infarction  - This event seemed to have started on approximately Thursday. Troponin now 5. EKG is abnormal however unchanged from prior EKG last year. It would not be unreasonable to begin IV heparin. Aspirin allergy noted. Statin. Beta blocker as tolerated. Ultimately, cardiac catheterization on Monday should take place unless symptoms worsen or become more progressive. Discussed with wife. There is no significant widening of mediastinum on x-ray. We will check echo.  2. Nausea vomiting diarrhea lethargy-this could be associated solely with non-ST elevation myocardial infarction or perhaps he developed a gastroenteritis with subsequent inflammatory response which launched plaque rupture and non-ST elevation myocardial infarction. White count currently 7.1. Creatinine 1.2. Cholesterol 140. I appreciate hospitalist admission to ensure that he does not have any other underlying systemic process that may have driven this current non-ST elevation myocardial infarction given his associated GI symptoms.  3. Hyperlipidemia-resume statin.  4. Coronary  artery disease-post bypass. 2003. See grafts as above.  Candee Furbish, MD  01/27/2015  7:51 AM

## 2015-01-27 NOTE — Progress Notes (Signed)
[place] for Tirofiban Indication: chest pain/ACS  Allergies  Allergen Reactions  . Aspirin   . Ramipril     Patient Measurements: Height: 5\' 10"  (177.8 cm) Weight: 180 lb (81.647 kg) IBW/kg (Calculated) : 73 Heparin Dosing Weight: 80 kg  Vital Signs: BP: 179/114 mmHg (09/17 1731) Pulse Rate: 0 (09/17 1741)  Labs:  Recent Labs  01/27/15 0631 01/27/15 0754 01/27/15 0934 01/27/15 1255  HGB 14.4  --   --   --   HCT 43.1  --   --   --   PLT 190  --   --   --   APTT  --  28 96*  --   LABPROT  --   --  14.7  --   INR  --   --  1.13  --   CREATININE 1.20  --  1.04  --   TROPONINI 5.47*  --   --  21.15*    Estimated Creatinine Clearance: 60.4 mL/min (by C-G formula based on Cr of 1.04).   Medications:  ASA  Zetia  Assessment: 80 y.o. male with NSTEMI, post cath, to receive tirofiban for 6 hours.  Heparin has been stopped    Plan:  Tirofiban 0.15 mcg/kg/min x 6 hours.  Heparin is stopped.    Candie Mile 01/27/2015,6:17 PM

## 2015-01-27 NOTE — ED Notes (Signed)
Dr. Marlou Porch at bedside, states will be calling an "urgent NSTEMI" for pt to go to cath lab.

## 2015-01-27 NOTE — Progress Notes (Signed)
Utilization Review Completed. 80 y.o. Male admitted to SDU with NSTEMI. Pain, SOB X 2D with N,V,D. Elevated Troponins on admission. Hx noncompliance since CABG 2003. Private residence with spouse. CM will follow for discharge needs and disposition. Will certainly need teaching and reinforcement of need for compliance and follow up with PCP and Cardio.

## 2015-01-27 NOTE — ED Notes (Signed)
Pt reports left sided chest pain that started Thursday, and worsened on Friday. Pt reports a burning pain, and states that he has vomited 3-4 times, and each time he has felt like some pressure has been relieved. Pt denies SOB.

## 2015-01-27 NOTE — Progress Notes (Signed)
    Concerning continued increase in troponin up to 20 now. His EKG also showing dynamic ST segment changes especially in the anterior precordial leads concerning for worsening ischemia. He currently is complaining of only mild burning in his chest.  I have gone ahead and activated the cath lab. I discussed with Dr. Fletcher Anon. We'll start IV nitroglycerin as well.  Discussed risks and benefits of cardiac catheterization including stroke, worsening MI, need for urgent bypass surgery, renal impairment, bleeding. Willing to proceed.  Candee Furbish, MD

## 2015-01-27 NOTE — ED Provider Notes (Signed)
CSN: [id number]     Arrival date & time 01/27/15  0502 History   First MD Initiated Contact with Patient 01/27/15 [phone]     Chief Complaint  Patient presents with  . Chest Pain     (Consider location/radiation/quality/duration/timing/severity/associated sxs/prior Treatment) The history is provided by the patient and the spouse.     Patient is a  80 year old male with history of CAD s/p CABG, hypertension, hyperlipidemia, carotid stenosis, AI, is apparently not currently taking meds per pt and wife, presents to the ED with 2 days of left-sided chest pain described as an ache, and pressure without radiation.  The pressure began on Thursday when he was outside in the heat working in the yard, he was rated 6 out of 10, worsened with exertion with associated exertional shortness of breath, chest pain also worsened with inspiration and palpation.  CP has been constant since it began, without any relief.  The chest pain worsened yesterday, and was associated with several vomiting episodes. Patient was apparently at a church function when he began vomiting, he also had a few episodes of diarrhea. He had assistance getting to his car and he was able to drive himself home. Later when his wife got home from work he complained of nausea, vomiting and diarrhea to her, but no chest pain. He did feel generally fatigued. He denies any sick contacts. This morning when he woke up he had further vomiting and did not feel well and felt that he needed to come to the ER for evaluation.  The pt is a poor historian, history aided by his wife.  Past Medical History  Diagnosis Date  . Hypertension   . Prostate ca     a. s/p seed implants  . Hyperlipidemia   . Peptic ulcer disease   . Internal hemorrhoids   . Carotid stenosis     a. 09/2013: carotid dopplers with stable 1-39% ICA stenosis bilaterally  . CAD (coronary artery disease)     a. CABG (2003)  b. 9/17/6: NSTEMI s/p overlapping DES x2 to LAD and DESx1 to  SVG-->OM   . Dementia   . Ischemic cardiomyopathy     a. 2D ECHO 01/28/15 w/ EF 25-30%, severe HK of the basal-midinferoseptal myocardium, basal-midinferior myocardium and midanteroseptal myocardium. Akinesis of apical inferior and apicalanterior myocardium. G2DD. Mild AS, Asc aorta diameter 4mm. Mild dilation of aortic root. Mild MR, mild PR and PA peak pressure 36 mmg HG.   Past Surgical History  Procedure Laterality Date  . Cardiac surgery  2003    SVG LAD, SVG to right coronary artery, SVG to marginal  . Cardiac catheterization N/A 01/27/2015    Procedure: Left Heart Cath and Coronary Angiography;  Surgeon: Wellington Hampshire, MD;  Location: [facility];  Service: Cardiovascular;  Laterality: N/A;   Family History  Problem Relation Age of Onset  . Stroke Father    Social History  Substance Use Topics  . Smoking status: Former Research scientist (life sciences)  . Smokeless tobacco: None  . Alcohol Use: No    Review of Systems  Constitutional: Positive for fatigue. Negative for fever and appetite change.  HENT: Negative.   Eyes: Negative.   Gastrointestinal: Positive for nausea, vomiting and diarrhea. Negative for abdominal pain, constipation, blood in stool, abdominal distention, anal bleeding and rectal pain.  Endocrine: Negative.   Genitourinary: Negative.   Musculoskeletal: Negative.   Skin: Negative.   Neurological: Positive for weakness and light-headedness. Negative for tremors and syncope.  Allergies  Aspirin and Ramipril  Home Medications   Prior to Admission medications   Medication Sig Start Date End Date Taking? Authorizing Provider  aspirin EC 81 MG tablet Take 1 tablet (81 mg total) by mouth daily. Patient not taking: Reported on 01/27/2015 09/08/13   [name], MD  carvedilol (COREG) 6.25 MG tablet Take 1 tablet (6.25 mg total) by mouth 2 (two) times daily with a meal. 01/30/15   Eileen Stanford, PA-C  ezetimibe (ZETIA) 10 MG tablet Take 1 tablet (10 mg total) by mouth  daily. Patient not taking: Reported on 01/27/2015 09/15/13   [name], MD  furosemide (LASIX) 20 MG tablet Take 1 tablet (20 mg total) by mouth daily. 01/30/15   Eileen Stanford, PA-C  losartan (COZAAR) 50 MG tablet Take 1 tablet (50 mg total) by mouth daily. 01/30/15   Eileen Stanford, PA-C  nitroGLYCERIN (NITROSTAT) 0.4 MG SL tablet Place 1 tablet (0.4 mg total) under the tongue every 5 (five) minutes x 3 doses as needed for chest pain. 01/30/15   Eileen Stanford, PA-C  ticagrelor (BRILINTA) 90 MG TABS tablet Take 1 tablet (90 mg total) by mouth 2 (two) times daily. 01/30/15   Eileen Stanford, PA-C   BP 144/78 mmHg  Pulse 69  Temp(Src) 98.3 F (36.8 C) (Oral)  Resp 18  Ht 5\' 6"  (1.676 m)  Wt 164 lb 11.2 oz (74.707 kg)  BMI 26.60 kg/m2  SpO2 100% Physical Exam  Constitutional: He appears well-developed and well-nourished. No distress.  Elderly male, appears stated age, non-toxic, but ill-appearing, NAD  HENT:  Head: Normocephalic and atraumatic.  Nose: Nose normal.  Mouth/Throat: Oropharynx is clear and moist. No oropharyngeal exudate.  Eyes: Conjunctivae and EOM are normal. Pupils are equal, round, and reactive to light. Right eye exhibits no discharge. Left eye exhibits no discharge. No scleral icterus.  Neck: Normal range of motion. No JVD present. No tracheal deviation present. No thyromegaly present.  Cardiovascular: Normal rate, regular rhythm, normal heart sounds and intact distal pulses.  Exam reveals no gallop and no friction rub.   No murmur heard. Bilateral LE edema, non-pitting  Pulmonary/Chest: Effort normal. No respiratory distress. He has no wheezes. He has no rales. He exhibits tenderness.  Decreased BS throughout, poor inspiratory effort, no wheeze, rhonchi or rales auscultated, superior left chest over L pec to left axilla tender to palpation  Abdominal: Soft. Bowel sounds are normal. He exhibits no distension and no mass. There is no tenderness. There is  no rebound and no guarding.  Musculoskeletal: Normal range of motion. He exhibits no edema or tenderness.  Lymphadenopathy:    He has no cervical adenopathy.  Neurological: He has normal reflexes. No cranial nerve deficit. He exhibits normal muscle tone. Coordination normal.  Skin: Skin is warm and dry. No rash noted. He is not diaphoretic. No erythema. There is pallor.  Psychiatric: He has a normal mood and affect. His behavior is normal. Judgment normal.  Nursing note and vitals reviewed.      ED Course  Procedures (including critical care time) Labs Review Labs Reviewed  BASIC METABOLIC PANEL - Abnormal; Notable for the following:    Chloride 100 (*)    Glucose, Bld 154 (*)    GFR calc non Af Amer 56 (*)    All other components within normal limits  TROPONIN I - Abnormal; Notable for the following:    Troponin I 5.47 (*)    All other components within normal  limits  HEPATIC FUNCTION PANEL - Abnormal; Notable for the following:    AST 87 (*)    Bilirubin, Direct <0.1 (*)    All other components within normal limits  URINALYSIS, ROUTINE W REFLEX MICROSCOPIC (NOT AT [facility]) - Abnormal; Notable for the following:    APPearance CLOUDY (*)    Hgb urine dipstick MODERATE (*)    Ketones, ur 40 (*)    Protein, ur 30 (*)    All other components within normal limits  COMPREHENSIVE METABOLIC PANEL - Abnormal; Notable for the following:    Glucose, Bld 149 (*)    AST 125 (*)    All other components within normal limits  HEMOGLOBIN A1C - Abnormal; Notable for the following:    Hgb A1c MFr Bld 6.2 (*)    All other components within normal limits  APTT - Abnormal; Notable for the following:    aPTT 96 (*)    All other components within normal limits  TROPONIN I - Abnormal; Notable for the following:    Troponin I 21.15 (*)    All other components within normal limits  TROPONIN I - Abnormal; Notable for the following:    Troponin I >65.00 (*)    All other components within normal  limits  TROPONIN I - Abnormal; Notable for the following:    Troponin I >65.00 (*)    All other components within normal limits  BASIC METABOLIC PANEL - Abnormal; Notable for the following:    Chloride 100 (*)    Glucose, Bld 139 (*)    GFR calc non Af Amer 58 (*)    All other components within normal limits  LIPID PANEL - Abnormal; Notable for the following:    Cholesterol 241 (*)    LDL Cholesterol 162 (*)    All other components within normal limits  BASIC METABOLIC PANEL - Abnormal; Notable for the following:    Potassium 3.4 (*)    Chloride 97 (*)    Glucose, Bld 156 (*)    BUN 21 (*)    Creatinine, Ser 1.51 (*)    GFR calc non Af Amer 42 (*)    GFR calc Af Amer 49 (*)    All other components within normal limits  BASIC METABOLIC PANEL - Abnormal; Notable for the following:    Glucose, Bld 181 (*)    BUN 22 (*)    Creatinine, Ser 1.45 (*)    GFR calc non Af Amer 45 (*)    GFR calc Af Amer 52 (*)    All other components within normal limits  I-STAT TROPOININ, ED - Abnormal; Notable for the following:    Troponin i, poc 17.40 (*)    All other components within normal limits  MRSA PCR SCREENING  CBC  LIPASE, BLOOD  MAGNESIUM  APTT  PROTIME-INR  TSH  T4, FREE  URINE MICROSCOPIC-ADD ON  CBC  CBC  CBC  POCT ACTIVATED CLOTTING TIME    Imaging Review No results found. I have personally reviewed and evaluated these images and lab results as part of my medical decision-making.   EKG Interpretation   Date/Time:  Saturday January 27 2015 12:28:42 EDT Ventricular Rate:  89 PR Interval:  177 QRS Duration: 99 QT Interval:  404 QTC Calculation: 492 R Axis:   -16 Text Interpretation:  Sinus rhythm Probable anterior infarct, age  indeterminate Lateral leads are also involved No significant change since  last tracing Confirmed by FLOYD MD, DANIEL [id number]) on 01/27/2015 12:33:14  PM      MDM   Final diagnoses:  NSTEMI (non-ST elevated myocardial infarction)    Pt with CP x 2 days worse with exertion, N, V, D x 1d-2d Pt has significant cardiac hx, does not appear to be taking any meds/no recent cardiac hx or visits in chart.   Cardiac workup initiated + troponin 5.47, CXR neg, labs otherwise unremarkable, EKG changes  Cardiology called for NSTEMI - Dr. Marlou Porch will come see pt, request that medicine admit to r/o systemic illness given his appearance and N/V/D fatigue  Dr. Bonner Puna to consult for NVD fatigue Dr. Marlou Porch to admit for CP/NSTEMI  1:14 PM  Troponin was repeated - resulted 17.40, Dr. Marlou Porch was notified of results (increasing troponin).  He is currently on a heparin drip, CP free, looks improved from his initial presentation.  He has eaten.  His wife is still with him in the ER room -waiting for his bed.  He was more talkative than this morning, coloring improved, current vitals: Filed Vitals:   01/27/15 1230  BP: 175/99  Pulse: 90  Resp: 21   Pt is currently CP free - update given to cardiology Delsa Grana, PA-C      Delsa Grana, PA-C 02/05/15 1415  April Palumbo, MD 02/05/15 2332

## 2015-01-27 NOTE — Progress Notes (Signed)
ANTICOAGULATION CONSULT NOTE - Initial Consult  Pharmacy Consult for Heparin  Indication: chest pain/ACS  Allergies  Allergen Reactions  . Aspirin   . Ramipril     Patient Measurements:   Heparin Dosing Weight: 80 kg  Vital Signs: BP: 178/100 mmHg (09/17 0715) Pulse Rate: 90 (09/17 0715)  Labs:  Recent Labs  01/27/15 0631  HGB 14.4  HCT 43.1  PLT 190  CREATININE 1.20  TROPONINI 5.47*    CrCl cannot be calculated (Unknown ideal weight.).   Medical History: Past Medical History  Diagnosis Date  . Hypertension   . Prostate ca     Seed implants  . Hyperlipidemia   . Peptic ulcer disease   . Internal hemorrhoids   . Carotid stenosis     Medications:  ASA  Zetia  Assessment: 80 y.o. male with NSTEMI for heparin  Goal of Therapy:  Heparin level 0.3-0.7 units/ml Monitor platelets by anticoagulation protocol: Yes   Plan:  Heparin 4000 units IV bolus, then start heparin 1000 units/hr Check heparin level in 8 hours.   Caryl Pina 01/27/2015,7:34 AM

## 2015-01-28 ENCOUNTER — Inpatient Hospital Stay (HOSPITAL_COMMUNITY): Payer: Medicare Other

## 2015-01-28 DIAGNOSIS — R079 Chest pain, unspecified: Secondary | ICD-10-CM

## 2015-01-28 LAB — BASIC METABOLIC PANEL
Anion gap: 11 (ref 5–15)
BUN: 12 mg/dL (ref 6–20)
CALCIUM: 9.1 mg/dL (ref 8.9–10.3)
CHLORIDE: 100 mmol/L — AB (ref 101–111)
CO2: 26 mmol/L (ref 22–32)
CREATININE: 1.16 mg/dL (ref 0.61–1.24)
GFR calc Af Amer: >60 mL/min (ref >60)
GFR calc non Af Amer: 58 mL/min — ABNORMAL LOW (ref >60)
GLUCOSE: 139 mg/dL — AB (ref 65–99)
Potassium: 3.5 mmol/L (ref 3.5–5.1)
Sodium: 137 mmol/L (ref 135–145)

## 2015-01-28 LAB — CBC
HCT: 43.3 % (ref 39.0–52.0)
Hemoglobin: 14.4 g/dL (ref 13.0–17.0)
MCH: 28.3 pg (ref 26.0–34.0)
MCHC: 33.3 g/dL (ref 30.0–36.0)
MCV: 85.1 fL (ref 78.0–100.0)
PLATELETS: 178 10*3/uL (ref 150–400)
RBC: 5.09 MIL/uL (ref 4.22–5.81)
RDW: 14 % (ref 11.5–15.5)
WBC: 8.3 10*3/uL (ref 4.0–10.5)

## 2015-01-28 LAB — TROPONIN I: Troponin I: >65 ng/mL (ref <0.031)

## 2015-01-28 LAB — LIPID PANEL
CHOL/HDL RATIO: 3.6 ratio
CHOLESTEROL: 241 mg/dL — AB (ref 0–200)
HDL: 67 mg/dL (ref >40)
LDL Cholesterol: 162 mg/dL — ABNORMAL HIGH (ref 0–99)
Triglycerides: 59 mg/dL (ref <150)
VLDL: 12 mg/dL (ref 0–40)

## 2015-01-28 MED ORDER — FUROSEMIDE 10 MG/ML IJ SOLN
40.0000 mg | Freq: Two times a day (BID) | INTRAMUSCULAR | Status: DC
  Administered 2015-01-28 – 2015-01-29 (×3): 40 mg via INTRAVENOUS
  Filled 2015-01-28 (×3): qty 4

## 2015-01-28 MED ORDER — INFLUENZA VAC SPLIT QUAD 0.5 ML IM SUSY
0.5000 mL | PREFILLED_SYRINGE | INTRAMUSCULAR | Status: DC
Start: 2015-01-29 — End: 2015-01-30
  Filled 2015-01-28: qty 0.5

## 2015-01-28 MED ORDER — LOSARTAN POTASSIUM 50 MG PO TABS
50.0000 mg | ORAL_TABLET | Freq: Every day | ORAL | Status: DC
  Administered 2015-01-28 – 2015-01-29 (×2): 50 mg via ORAL
  Filled 2015-01-28 (×3): qty 1

## 2015-01-28 MED ORDER — PNEUMOCOCCAL VAC POLYVALENT 25 MCG/0.5ML IJ INJ
0.5000 mL | INJECTION | INTRAMUSCULAR | Status: DC
  Filled 2015-01-28: qty 0.5

## 2015-01-28 MED ORDER — FUROSEMIDE 40 MG PO TABS: 40.0000 mg | ORAL_TABLET | Freq: Two times a day (BID) | ORAL | Status: DC

## 2015-01-28 MED ORDER — CARVEDILOL 6.25 MG PO TABS
6.2500 mg | ORAL_TABLET | Freq: Two times a day (BID) | ORAL | Status: DC
  Administered 2015-01-28 – 2015-01-30 (×5): 6.25 mg via ORAL
  Filled 2015-01-28 (×5): qty 1

## 2015-01-28 NOTE — Progress Notes (Signed)
Family Medicine Teaching Service Consult Progress Note Service Pager: [phone]  Patient name: Cordera Borsellino Medical record number: [id number] Date of birth: June 09, 1934 Age: 80 y.o. Gender: male  Primary Care Bayley Hurn: Shirline Frees, MD Admitting Service: Cardiology Code Status: Full  Chief Complaint: NSTEMI Consult: Consulted by Cardiology for management of N/V/D  Overview: Roi Hjort is a 80 y.o. male with CAD s/p CABG in 2003, HTN, HLD and poor follow up presented with chest "ache" and pressure, nausea, vomiting, diarrhea found to have troponin elevation and evolving ST changes on ECG, taken to cath lab 9/17 with stenting to LAD and SVG graft to OM.   S: He has exhibited delirium over night attempting to get out of the bed. Per patient and RN report he has had no further nausea, emesis or loose stools since admission. No complaints this morning. No family at bedside.  O: BP 138/90 mmHg  Pulse 72  Temp(Src) 97.9 F (36.6 C) (Oral)  Resp 25  Ht 5\' 5"  (1.651 m)  Wt 168 lb (76.204 kg)  BMI 27.96 kg/m2  SpO2 96% General: 80yo male resting nearly flat in no apparent distress ENTM: Moist mucous membranes Cardiovascular: Regular rate with II/VI early systolic murmur loudest at apex. No rub or gallop. No significant dependent edema.  Respiratory: Normal work of breathing on room air (nasal cannula not in nares), clear to auscultation throughout without crackles.  Abdomen: +BS, soft, nontender, nondistended.  Neuro: Drowsy but rousable, oriented to person, place, and month, but not situation (denies heart attack).   Labs and Imaging: CBC BMET   Recent Labs Lab 01/28/15 0051  WBC 8.3  HGB 14.4  HCT 43.3  PLT 178    Recent Labs Lab 01/28/15 0051  NA 137  K 3.5  CL 100*  CO2 26  BUN 12  CREATININE 1.16  GLUCOSE 139*  CALCIUM 9.1     A/P: 80yo male with limited medical follow up presenting with NSTEMI now s/p PCI 9/17 doing well HD #2. Also with acute on chronic  HFrEF though clinically more compensated than would be expected. GI complaints likely secondary to ACS with no exam or laboratory signs of systemic inflammation. These have resolved and FP contributions are limited. Please call if we can offer any other assistance. Thank you for this consult.   Patrecia Pour, MD 01/28/2015, 8:34 AM PGY-3, [facility] Intern pager: [phone], text pages welcome

## 2015-01-28 NOTE — Progress Notes (Signed)
Echocardiogram 2D Echocardiogram has been performed.  [name] 01/28/2015, 2:21 PM

## 2015-01-28 NOTE — Progress Notes (Addendum)
Subjective:  No chest pain. Confusion. No SOB.   Objective:  Vital Signs in the last 24 hours: Temp:  [97.6 F (36.4 C)-99 F (37.2 C)] 97.9 F (36.6 C) (09/18 0400) Pulse Rate:  [0-295] 72 (09/18 0700) Resp:  [0-37] 25 (09/18 0310) BP: (141-206)/(68-131) 141/71 mmHg (09/18 0700) SpO2:  [0 %-99 %] 97 % (09/18 0700) Weight:  [168 lb (76.204 kg)-174 lb 6.1 oz (79.1 kg)] 168 lb (76.204 kg) (09/18 0500)  Intake/Output from previous day: 09/17 0701 - 09/18 0700 In: 567.7 [P.O.:240; I.V.:327.7] Out: 1725 [Urine:1725]   Physical Exam: General: Well developed, well nourished, in no acute distress. Head:  Normocephalic and atraumatic. Lungs: Clear to auscultation and percussion. Heart: Normal S1 and S2.  2/6 S apical murmur, no rubs or gallops.  Abdomen: soft, non-tender, positive bowel sounds. Extremities: No clubbing or cyanosis. No edema. Neurologic: Mild somnolence.     Lab Results:  Recent Labs  01/27/15 0631 01/28/15 0051  WBC 7.1 8.3  HGB 14.4 14.4  PLT 190 178    Recent Labs  01/27/15 0934 01/28/15 0051  NA 138 137  K 4.1 3.5  CL 103 100*  CO2 23 26  GLUCOSE 149* 139*  BUN 11 12  CREATININE 1.04 1.16    Recent Labs  01/27/15 1940 01/28/15 0051  TROPONINI >65.00* >65.00*   Hepatic Function Panel  Recent Labs  01/27/15 0631 01/27/15 0934  PROT 7.0 7.0  ALBUMIN 3.7 3.5  AST 87* 125*  ALT 23 25  ALKPHOS 78 76  BILITOT 1.1 1.1  BILIDIR <0.1*  --   IBILI NOT CALCULATED  --     Recent Labs  01/28/15 0051  CHOL 241*   No results for input(s): PROTIME in the last 72 hours.  Imaging: Dg Chest 2 View  01/27/2015   CLINICAL DATA:  LEFT-sided chest pain for 2 days, now worsening. Vomiting. Chest pressure. History of hypertension, prostate cancer, peptic ulcer disease.  EXAM: CHEST  2 VIEW  COMPARISON:  None available for comparison at time of study interpretation.  FINDINGS: The cardiac silhouette is mildly enlarged, status post median  sternotomy for CABG. Tortuous aorta. Mildly increased lung volumes with flattened hemidiaphragm. Bibasilar atelectasis/ scarring, pleural thickening LEFT lung base without pleural effusion or focal consolidation. No pneumothorax. Soft tissue planes and included osseous structures are nonsuspicious, mild degenerative change of the thoracic spine.  IMPRESSION: Mild cardiomegaly. Findings of COPD with bibasilar atelectasis/scarring.   Electronically Signed   By: Elon Alas M.D.   On: 01/27/2015 06:45   Personally viewed.   Telemetry: One 5 beat NSVT. NSR Personally viewed.   EKG:  Prior ST J point elevation anterior precordial, depression lateral.   Cardiac Studies:  Cath 01/27/15 - Dr. Fletcher Anon.  - SVG to OM DES  - Mid LAD stent  - EF 30%. Anteroapical akinesis .  Scheduled Meds: . antiseptic oral rinse  7 mL Mouth Rinse BID  . aspirin EC  81 mg Oral Daily  . atorvastatin  80 mg Oral q1800  . carvedilol  6.25 mg Oral BID WC  . furosemide  40 mg Intravenous BID  . losartan  50 mg Oral Daily  . sodium chloride  3 mL Intravenous Q12H  . ticagrelor  90 mg Oral BID   Continuous Infusions: . nitroGLYCERIN 40 mcg/min (01/28/15 0700)   PRN Meds:.sodium chloride, acetaminophen, nitroGLYCERIN, ondansetron (ZOFRAN) IV, sodium chloride    Assessment/Plan:  Principal Problem:   Non-ST elevation myocardial infarction (NSTEMI) Active  Problems:   Essential hypertension   Hyperlipidemia   Nausea, vomiting and diarrhea   CAD (coronary artery disease) of artery bypass graft   NSTEMI (non-ST elevated myocardial infarction)   80 year old with NSTEMI, post CABG, mild lethargy  1. NSTEMI  - Stent to LAD and SVG graft to OM  - Reduced EF 25-35%  - LVEDP 68mmHg, elevated  - Will give lasix 40mg  IV BID  - Will increase coreg to 6.25 BID   - Will increase losartan to 50mg    - Brilinta, ASA  - Wean off NTG gtt IV  - Trop 65  2. Acute on chronic systolic HF  - laying flat comfortably  despite EDP of 32 at cath  - Lasix IV as above  - Increased Bb and ARB  3. CAD  - Only SVG graft open was to OM and it was stented. Large territory.  - LAD site of plaque rupture. Stented x 2.   4. Delirium  - probably underlying dementia at baseline.   5. NSVT  - MI. Coreg  6. Severe MR  - ? Dilated LV, MI  ECHO P   Quite ill. Worrisome.  No further N/V. Appreciate FP service assistance  May transfer tomorrow or perhaps later today if clinically improved.   SKAINS, [place] 01/28/2015, 7:48 AM

## 2015-01-28 NOTE — Progress Notes (Signed)
CRITICAL VALUE ALERT  Critical value received:  Troponin >65  Date of notification:  01/28/15  Time of notification:  0142  Critical value read back:Yes.    Nurse who received alert:  Hadassah Pais RN  MD notified (1st page):  Dr Rosanna Randy   Time of first page:  0150  Pt post cath with stents placed. Expected lab value.

## 2015-01-29 ENCOUNTER — Encounter (HOSPITAL_COMMUNITY): Payer: Self-pay | Admitting: Cardiovascular Disease

## 2015-01-29 LAB — CBC
HCT: 44.7 % (ref 39.0–52.0)
Hemoglobin: 14.9 g/dL (ref 13.0–17.0)
MCH: 28.4 pg (ref 26.0–34.0)
MCHC: 33.3 g/dL (ref 30.0–36.0)
MCV: 85.3 fL (ref 78.0–100.0)
PLATELETS: 174 10*3/uL (ref 150–400)
RBC: 5.24 MIL/uL (ref 4.22–5.81)
RDW: 13.8 % (ref 11.5–15.5)
WBC: 7.2 10*3/uL (ref 4.0–10.5)

## 2015-01-29 LAB — BASIC METABOLIC PANEL
Anion gap: 10 (ref 5–15)
BUN: 21 mg/dL — AB (ref 6–20)
CO2: 29 mmol/L (ref 22–32)
CREATININE: 1.51 mg/dL — AB (ref 0.61–1.24)
Calcium: 9.1 mg/dL (ref 8.9–10.3)
Chloride: 97 mmol/L — ABNORMAL LOW (ref 101–111)
GFR calc Af Amer: 49 mL/min — ABNORMAL LOW (ref >60)
GFR, EST NON AFRICAN AMERICAN: 42 mL/min — AB (ref >60)
GLUCOSE: 156 mg/dL — AB (ref 65–99)
POTASSIUM: 3.4 mmol/L — AB (ref 3.5–5.1)
SODIUM: 136 mmol/L (ref 135–145)

## 2015-01-29 LAB — POCT ACTIVATED CLOTTING TIME: ACTIVATED CLOTTING TIME: 368 s

## 2015-01-29 LAB — HEMOGLOBIN A1C
HEMOGLOBIN A1C: 6.2 % — AB (ref 4.8–5.6)
Mean Plasma Glucose: 131 mg/dL

## 2015-01-29 MED ORDER — POTASSIUM CHLORIDE CRYS ER 20 MEQ PO TBCR
40.0000 meq | EXTENDED_RELEASE_TABLET | Freq: Once | ORAL | Status: AC
  Administered 2015-01-29: 40 meq via ORAL
  Filled 2015-01-29: qty 2

## 2015-01-29 MED ORDER — FUROSEMIDE 20 MG PO TABS: 20.0000 mg | ORAL_TABLET | Freq: Every day | ORAL | Status: DC

## 2015-01-29 MED ORDER — ATORVASTATIN CALCIUM 80 MG PO TABS: 80.0000 mg | ORAL_TABLET | Freq: Every day | ORAL | Status: DC

## 2015-01-29 NOTE — Progress Notes (Signed)
Patient Name: [name] Date of Encounter: 01/29/2015     Principal Problem:   Non-ST elevation myocardial infarction (NSTEMI) Active Problems:   Essential hypertension   Hyperlipidemia   Nausea, vomiting and diarrhea   CAD (coronary artery disease) of artery bypass graft   NSTEMI (non-ST elevated myocardial infarction)    SUBJECTIVE No complaints. No CP or SOB.  CURRENT MEDS . antiseptic oral rinse  7 mL Mouth Rinse BID  . aspirin EC  81 mg Oral Daily  . atorvastatin  80 mg Oral q1800  . carvedilol  6.25 mg Oral BID WC  . furosemide  40 mg Intravenous BID  . Influenza vac split quadrivalent PF  0.5 mL Intramuscular Tomorrow-1000  . losartan  50 mg Oral Daily  . pneumococcal 23 valent vaccine  0.5 mL Intramuscular Tomorrow-1000  . sodium chloride  3 mL Intravenous Q12H  . ticagrelor  90 mg Oral BID    OBJECTIVE  Filed Vitals:   01/28/15 1600 01/28/15 2053 01/29/15 0028 01/29/15 0450  BP: 154/98 121/68 146/72 143/85  Pulse:      Temp: 98.6 F (37 C) 98.2 F (36.8 C) 98.7 F (37.1 C) 98.2 F (36.8 C)  TempSrc: Oral Oral Oral Oral  Resp: 16     Height:      Weight:    170 lb (77.111 kg)  SpO2: 98% 96% 96% 94%    Intake/Output Summary (Last 24 hours) at 01/29/15 0927 Last data filed at 01/29/15 0000  Gross per 24 hour  Intake 250.53 ml  Output   1655 ml  Net -1404.47 ml   Filed Weights   01/27/15 1800 01/28/15 0500 01/29/15 0450  Weight: 174 lb 6.1 oz (79.1 kg) 168 lb (76.204 kg) 170 lb (77.111 kg)    PHYSICAL EXAM  General: Pleasant, NAD. Neuro: Alert and oriented X 3. Moves all extremities spontaneously. Psych: Normal affect. HEENT:  Normal  Neck: Supple without bruits or JVD. Lungs:  Resp regular and unlabored, CTA. Heart: RRR no s3, s4. 2/6 S apical murmur, no rubs or gallops.  Abdomen: Soft, non-tender, non-distended, BS + x 4.  Extremities: No clubbing, cyanosis or edema. DP/PT/Radials 2+ and equal bilaterally.  Accessory Clinical  Findings  CBC  Recent Labs  01/28/15 0051 01/29/15 0317  WBC 8.3 7.2  HGB 14.4 14.9  HCT 43.3 44.7  MCV 85.1 85.3  PLT 178 [id number]   Basic Metabolic Panel  Recent Labs  01/27/15 0754 01/27/15 0934 01/28/15 0051  NA  --  138 137  K  --  4.1 3.5  CL  --  103 100*  CO2  --  23 26  GLUCOSE  --  149* 139*  BUN  --  11 12  CREATININE  --  1.04 1.16  CALCIUM  --  8.9 9.1  MG 1.7  --   --    Liver Function Tests  Recent Labs  01/27/15 0631 01/27/15 0934  AST 87* 125*  ALT 23 25  ALKPHOS 78 76  BILITOT 1.1 1.1  PROT 7.0 7.0  ALBUMIN 3.7 3.5    Recent Labs  01/27/15 0631  LIPASE 30   Cardiac Enzymes  Recent Labs  01/27/15 1255 01/27/15 1940 01/28/15 0051  TROPONINI 21.15* >65.00* >65.00*   Fasting Lipid Panel  Recent Labs  01/28/15 0051  CHOL 241*  HDL 67  LDLCALC 162*  TRIG 59  CHOLHDL 3.6   Thyroid Function Tests  Recent Labs  01/27/15 0934  TSH 1.864  TELE  NSR with freq PVCs.  Radiology/Studies  Dg Chest 2 View  01/27/2015   CLINICAL DATA:  LEFT-sided chest pain for 2 days, now worsening. Vomiting. Chest pressure. History of hypertension, prostate cancer, peptic ulcer disease.  EXAM: CHEST  2 VIEW  COMPARISON:  None available for comparison at time of study interpretation.  FINDINGS: The cardiac silhouette is mildly enlarged, status post median sternotomy for CABG. Tortuous aorta. Mildly increased lung volumes with flattened hemidiaphragm. Bibasilar atelectasis/ scarring, pleural thickening LEFT lung base without pleural effusion or focal consolidation. No pneumothorax. Soft tissue planes and included osseous structures are nonsuspicious, mild degenerative change of the thoracic spine.  IMPRESSION: Mild cardiomegaly. Findings of COPD with bibasilar atelectasis/scarring.   Electronically Signed   By: Elon Alas M.D.   On: 01/27/2015 06:45   LHC (01/27/15) Conclusion     Prox RCA lesion, 100% stenosed.  Prox Cx lesion, 100%  stenosed.  LM lesion, 40% stenosed.  SVG was injected .  Origin lesion, 100% stenosed.  SVG was injected is normal in caliber.  SVG was injected .  Origin lesion, 100% stenosed.  Mid LAD-2 lesion, 99% stenosed.  Mid LAD-1 lesion, 99% stenosed. There is a 0% residual stenosis post intervention. The lesion was not previously treated.  Mid Graft lesion, 90% stenosed. There is a 10% residual stenosis post intervention.  There is severe left ventricular systolic dysfunction. 1. Severe three-vessel coronary artery disease with chronically occluded left circumflex and right coronary artery. Severe subtotal occlusion in the mid LAD. 2. Chronically occluded SVG to RCA (with left-to-right collaterals) and SVG to LAD. Severe 90% stenosis in the SVG to OM. 3. Severely reduced LV systolic function with segmental wall motion abnormalities as outlined above and severe mitral regurgitation. 4. Severely elevated left ventricular end-diastolic pressure. 5. Successful angioplasty and drug-eluting stent placement to the LAD 2 in an overlapped fashion and 1 drug-eluting stent placement to SVG to OM 2. Recommendations: Continue blood pressure control. I increased the dose of intravenous nitroglycerin. I started small dose carvedilol and losartan. There is reported allergy to ACE inhibitor use. It is not really clear if he is truly allergic to aspirin or not. He does not seem to be as I asked him and checked with his wife. It seems to be more likely intolerance. Continue dual antiplatelets therapy for at least one year. Check echocardiogram to evaluate his mitral regurgitation which hopefully should improve with revascularization. I elected to start Aggrastat for 6 hours.       2D ECHO LV EF: 25-30% Study Conclusions - Left ventricle: There is akinesis of the distal septum and apex. The cavity size was normal. There was moderate concentric hypertrophy. Systolic function was severely reduced.  The estimated ejection fraction was in the range of 25% to 30%. There is severe hypokinesis of the basal-midinferoseptal myocardium. There is severe hypokinesis of the basal-midinferior myocardium. There is akinesis of the apicalinferior myocardium. There is akinesis of the apicalanterior myocardium. There is severe hypokinesis of the midanteroseptal myocardium. Features are consistent with a pseudonormal left ventricular filling pattern, with concomitant abnormal relaxation and increased filling pressure (grade 2 diastolic dysfunction). Doppler parameters are consistent with high ventricular filling pressure. - Aortic valve: Severely calcified annulus. Probably trileaflet. Mild thickening and calcification. There was mild stenosis. There was moderate regurgitation. Valve area (VTI): 1.5 cm^2. Valve area (Vmax): 1.59 cm^2. Valve area (Vmean): 1.54 cm^2. - Aorta: Ascending aortic diameter: 40 mm (S). - Aortic root: The aortic root was mildly dilated. -  Ascending aorta: The ascending aorta was mildly dilated. - Mitral valve: There was mild regurgitation. - Left atrium: The atrium was mildly dilated. - Pulmonic valve: There was mild regurgitation. - Pulmonary arteries: PA peak pressure: 36 mm Hg (S). Impressions: - The right ventricular systolic pressure was increased consistent with mild pulmonary hypertension.   ASSESSMENT AND PLAN  Clay Scipio is a 80 y.o. male with CAD s/p CABG in 2003, HTN, HLD and poor follow up presented with chest "ache" and pressure, nausea, vomiting, diarrhea found to have troponin elevation and evolving ST changes on ECG, taken to cath lab 9/17 with stenting to LAD and SVG graft to OM.   1. NSTEMI s/p overlapping DES x2 to LAD and DES to SVG-->OM (cath report above) - Trop >65 - Reduced EF 25-35% by LV gram and severe MR with elevated LV EDP (32) - Continue DAPT with Brilinta/ASA for at least 1 year. Continue coreg to 6.25 BID  and losartan to 50mg . Only on Zetia as an outpatient. Not clear why he is not on a statin. Patient does not know and it is not listed in his allergies so I will add atorva 80mg    2. Acute on chronic systolic HF/ischemic CM  - Reduced EF 25-35% by LV gram and severe MR with elevated LV EDP (32) - He had elevated LVEDP on LHC and was placed on IV lasix 40mg  IV BID. Net neg 2.5 L. Weights are erratic but looks like he is down ~4 lbs. No BMET today. Will order. Will switch to PO lasix today   - 2D ECHO 01/28/15 w/ EF 25-30%, severe HK of the basal-midinferoseptal myocardium, basal-midinferior myocardium and midanteroseptal myocardium. Akinesis of apical inferior and apicalanterior myocardium. G2DD. Mild AS, Asc aorta diameter 2mm. Mild dilation of aortic root. Mild MR, mild PR and PA peak pressure 36 mmg HG.  - Continue coreg to 6.25 BID and losartan to 50mg  . Will placed on lasix 20mg   3. Delirium - appears to be improved today. - probably underlying dementia at baseline.   4. NSVT - Improved on increased BB. Continue Coreg 6.25 mg BID  5. MR severe by LHC. - 2D ECHO on 01/29/15 with mild mitral regurgitation.    Judy Pimple PA-C  Pager [phone]  Patient examined chart reviewed. Recovering well Does not appear volume overloaded despite Elevated EDP at cath  Change to lasix 20 mg PO  Transfer to telemetry  Baseline dementia Is very evident  Jenkins Rouge

## 2015-01-29 NOTE — Progress Notes (Signed)
Pt transferred to [place] [facility] per MD order. Wife at bedside, NAD VSS. Pt taken to tele floor by [facility] by RN.

## 2015-01-29 NOTE — Care Management Note (Signed)
Case Management Note  Patient Details  Name: [name] MRN: [id number] Date of Birth: 07/16/1934  Subjective/Objective:    Adm w mi               Action/Plan:lives w fam, pcp dr Shirline Frees   Expected Discharge Date:                  Expected Discharge Plan:  Home/Self Care  In-House Referral:     Discharge planning Services  CM Consult, Medication Assistance  Post Acute Care Choice:    Choice offered to:     DME Arranged:    DME Agency:     HH Arranged:    HH Agency:     Status of Service:     Medicare Important Message Given:    Date Medicare IM Given:    Medicare IM give by:    Date Additional Medicare IM Given:    Additional Medicare Important Message give by:     If discussed at [place] of Stay Meetings, dates discussed:    Additional Comments: ur review done. Gave pt 30day free brilinta card. Pt has [facility] medicare ins for meds.  Lacretia Leigh, RN 01/29/2015, 9:54 AM

## 2015-01-29 NOTE — Progress Notes (Signed)
CARDIAC REHAB PHASE I   PRE:  Rate/Rhythm: 79 SR    BP: sitting 122/98     SaO2: 97 RA  MODE:  Ambulation: 350 ft   POST:  Rate/Rhythm: 90 SR    BP: sitting 112/59     SaO2: 98 RA  Pt thinking clearly. Able to walk with RW, gait belt. Intermittent SOB while walking, fatigued at about 1/2 way but able to make it back to room. To recliner with wife present. Began ed process. Pt and wife both struggle with medical comprehension. Pts wife seems to doubt the importance of his medicines. Discussed these and left materials to read. Will f/u tomorrow. [place], [place], ACSM 01/29/2015 12:23 PM

## 2015-01-30 ENCOUNTER — Encounter (HOSPITAL_COMMUNITY): Payer: Self-pay | Admitting: Physician Assistant

## 2015-01-30 DIAGNOSIS — I25118 Atherosclerotic heart disease of native coronary artery with other forms of angina pectoris: Secondary | ICD-10-CM | POA: Diagnosis present

## 2015-01-30 DIAGNOSIS — F0390 Unspecified dementia without behavioral disturbance: Secondary | ICD-10-CM | POA: Diagnosis present

## 2015-01-30 DIAGNOSIS — I255 Ischemic cardiomyopathy: Secondary | ICD-10-CM | POA: Diagnosis present

## 2015-01-30 DIAGNOSIS — I739 Peripheral vascular disease, unspecified: Secondary | ICD-10-CM

## 2015-01-30 DIAGNOSIS — I779 Disorder of arteries and arterioles, unspecified: Secondary | ICD-10-CM | POA: Diagnosis present

## 2015-01-30 LAB — CBC
HEMATOCRIT: 43 % (ref 39.0–52.0)
HEMOGLOBIN: 14.5 g/dL (ref 13.0–17.0)
MCH: 28.5 pg (ref 26.0–34.0)
MCHC: 33.7 g/dL (ref 30.0–36.0)
MCV: 84.5 fL (ref 78.0–100.0)
Platelets: 164 10*3/uL (ref 150–400)
RBC: 5.09 MIL/uL (ref 4.22–5.81)
RDW: 13.7 % (ref 11.5–15.5)
WBC: 6.4 10*3/uL (ref 4.0–10.5)

## 2015-01-30 LAB — BASIC METABOLIC PANEL
ANION GAP: 10 (ref 5–15)
BUN: 22 mg/dL — ABNORMAL HIGH (ref 6–20)
CHLORIDE: 102 mmol/L (ref 101–111)
CO2: 26 mmol/L (ref 22–32)
Calcium: 8.9 mg/dL (ref 8.9–10.3)
Creatinine, Ser: 1.45 mg/dL — ABNORMAL HIGH (ref 0.61–1.24)
GFR calc non Af Amer: 45 mL/min — ABNORMAL LOW (ref >60)
GFR, EST AFRICAN AMERICAN: 52 mL/min — AB (ref >60)
GLUCOSE: 181 mg/dL — AB (ref 65–99)
POTASSIUM: 3.8 mmol/L (ref 3.5–5.1)
Sodium: 138 mmol/L (ref 135–145)

## 2015-01-30 MED ORDER — NITROGLYCERIN 0.4 MG SL SUBL: 0.4000 mg | SUBLINGUAL_TABLET | SUBLINGUAL | Status: DC | PRN

## 2015-01-30 MED ORDER — TICAGRELOR 90 MG PO TABS: 90.0000 mg | ORAL_TABLET | Freq: Two times a day (BID) | ORAL | Status: DC

## 2015-01-30 MED ORDER — FUROSEMIDE 20 MG PO TABS: 20.0000 mg | ORAL_TABLET | Freq: Every day | ORAL | Status: DC

## 2015-01-30 MED ORDER — POTASSIUM CHLORIDE CRYS ER 20 MEQ PO TBCR
40.0000 meq | EXTENDED_RELEASE_TABLET | Freq: Once | ORAL | Status: DC
  Filled 2015-01-30: qty 2

## 2015-01-30 MED ORDER — LOSARTAN POTASSIUM 50 MG PO TABS: 50.0000 mg | ORAL_TABLET | Freq: Every day | ORAL | Status: DC

## 2015-01-30 MED ORDER — CARVEDILOL 6.25 MG PO TABS: 6.2500 mg | ORAL_TABLET | Freq: Two times a day (BID) | ORAL | Status: DC

## 2015-01-30 NOTE — Progress Notes (Signed)
[id number] Education completed with pt and wife who voiced understanding. Had to review a few things as they had some difficulty with teach back. Gave CHF booklet due to low EF and encouraged daily weights and watching sodium. Reviewed low sodium restrictions and heart healthy food choices. Pt has both low sodium and heart healthy diets. Discussed NTG use, brilinta importance, MI restrictions, ex ed. Pt had attended CRP 2 before and permission given to refer to [name].  Wife stated he did not need walker for home. Graylon Good RN BSN 01/30/2015 11:31 AM

## 2015-01-30 NOTE — Progress Notes (Signed)
At 1500 pt received all d/c instructions pt and spouse.  Verbalized understanding.  Karie Kirks, Therapist, sports.

## 2015-01-30 NOTE — Progress Notes (Signed)
Patient Name: [name] Date of Encounter: 01/30/2015     Principal Problem:   Non-ST elevation myocardial infarction (NSTEMI) Active Problems:   Essential hypertension   Hyperlipidemia   Nausea, vomiting and diarrhea   CAD (coronary artery disease) of artery bypass graft   NSTEMI (non-ST elevated myocardial infarction)    SUBJECTIVE No complaints. No CP or SOB. He is ready to go home. Pastor in room. Patient's wife requests medical information to be discussed only with her and husband.  CURRENT MEDS . antiseptic oral rinse  7 mL Mouth Rinse BID  . aspirin EC  81 mg Oral Daily  . atorvastatin  80 mg Oral q1800  . carvedilol  6.25 mg Oral BID WC  . Influenza vac split quadrivalent PF  0.5 mL Intramuscular Tomorrow-1000  . losartan  50 mg Oral Daily  . pneumococcal 23 valent vaccine  0.5 mL Intramuscular Tomorrow-1000  . potassium chloride  40 mEq Oral Once  . sodium chloride  3 mL Intravenous Q12H  . ticagrelor  90 mg Oral BID    OBJECTIVE  Filed Vitals:   01/29/15 1600 01/29/15 1842 01/30/15 0007 01/30/15 0439  BP: 130/70 119/67 123/74 128/70  Pulse: 70 79 73 71  Temp: 98 F (36.7 C) 98.4 F (36.9 C) 98.9 F (37.2 C) 97.9 F (36.6 C)  TempSrc: Oral Oral Oral Oral  Resp: 18 20 18 19   Height:  5\' 6"  (1.676 m)    Weight:  166 lb 9.6 oz (75.569 kg)  164 lb 11.2 oz (74.707 kg)  SpO2: 99% 97% 97% 95%    Intake/Output Summary (Last 24 hours) at 01/30/15 0905 Last data filed at 01/30/15 0445  Gross per 24 hour  Intake    120 ml  Output   2077 ml  Net  -1957 ml   Filed Weights   01/29/15 0450 01/29/15 1842 01/30/15 0439  Weight: 170 lb (77.111 kg) 166 lb 9.6 oz (75.569 kg) 164 lb 11.2 oz (74.707 kg)    PHYSICAL EXAM  General: Pleasant, NAD. Neuro: Alert and oriented X 3. Moves all extremities spontaneously. Psych: Normal affect. HEENT:  Normal  Neck: Supple without bruits or JVD. Lungs:  Resp regular and unlabored, CTA. Heart: RRR no s3, s4. 2/6 S  apical murmur, no rubs or gallops.  Abdomen: Soft, non-tender, non-distended, BS + x 4.  Extremities: No clubbing, cyanosis or edema. DP/PT/Radials 2+ and equal bilaterally.  Accessory Clinical Findings  CBC  Recent Labs  01/29/15 0317 01/30/15 0505  WBC 7.2 6.4  HGB 14.9 14.5  HCT 44.7 43.0  MCV 85.3 84.5  PLT 174 [id number]   Basic Metabolic Panel  Recent Labs  01/28/15 0051 01/29/15 1143  NA 137 136  K 3.5 3.4*  CL 100* 97*  CO2 26 29  GLUCOSE 139* 156*  BUN 12 21*  CREATININE 1.16 1.51*  CALCIUM 9.1 9.1   Liver Function Tests  Recent Labs  01/27/15 0934  AST 125*  ALT 25  ALKPHOS 76  BILITOT 1.1  PROT 7.0  ALBUMIN 3.5   No results for input(s): LIPASE, AMYLASE in the last 72 hours. Cardiac Enzymes  Recent Labs  01/27/15 1255 01/27/15 1940 01/28/15 0051  TROPONINI 21.15* >65.00* >65.00*   Fasting Lipid Panel  Recent Labs  01/28/15 0051  CHOL 241*  HDL 67  LDLCALC 162*  TRIG 59  CHOLHDL 3.6   Thyroid Function Tests  Recent Labs  01/27/15 0934  TSH 1.864    TELE  NSR  Radiology/Studies  Dg Chest 2 View  01/27/2015   CLINICAL DATA:  LEFT-sided chest pain for 2 days, now worsening. Vomiting. Chest pressure. History of hypertension, prostate cancer, peptic ulcer disease.  EXAM: CHEST  2 VIEW  COMPARISON:  None available for comparison at time of study interpretation.  FINDINGS: The cardiac silhouette is mildly enlarged, status post median sternotomy for CABG. Tortuous aorta. Mildly increased lung volumes with flattened hemidiaphragm. Bibasilar atelectasis/ scarring, pleural thickening LEFT lung base without pleural effusion or focal consolidation. No pneumothorax. Soft tissue planes and included osseous structures are nonsuspicious, mild degenerative change of the thoracic spine.  IMPRESSION: Mild cardiomegaly. Findings of COPD with bibasilar atelectasis/scarring.   Electronically Signed   By: Elon Alas M.D.   On: 01/27/2015 06:45    LHC (01/27/15) Conclusion     Prox RCA lesion, 100% stenosed.  Prox Cx lesion, 100% stenosed.  LM lesion, 40% stenosed.  SVG was injected .  Origin lesion, 100% stenosed.  SVG was injected is normal in caliber.  SVG was injected .  Origin lesion, 100% stenosed.  Mid LAD-2 lesion, 99% stenosed.  Mid LAD-1 lesion, 99% stenosed. There is a 0% residual stenosis post intervention. The lesion was not previously treated.  Mid Graft lesion, 90% stenosed. There is a 10% residual stenosis post intervention.  There is severe left ventricular systolic dysfunction. 1. Severe three-vessel coronary artery disease with chronically occluded left circumflex and right coronary artery. Severe subtotal occlusion in the mid LAD. 2. Chronically occluded SVG to RCA (with left-to-right collaterals) and SVG to LAD. Severe 90% stenosis in the SVG to OM. 3. Severely reduced LV systolic function with segmental wall motion abnormalities as outlined above and severe mitral regurgitation. 4. Severely elevated left ventricular end-diastolic pressure. 5. Successful angioplasty and drug-eluting stent placement to the LAD 2 in an overlapped fashion and 1 drug-eluting stent placement to SVG to OM 2. Recommendations: Continue blood pressure control. I increased the dose of intravenous nitroglycerin. I started small dose carvedilol and losartan. There is reported allergy to ACE inhibitor use. It is not really clear if he is truly allergic to aspirin or not. He does not seem to be as I asked him and checked with his wife. It seems to be more likely intolerance. Continue dual antiplatelets therapy for at least one year. Check echocardiogram to evaluate his mitral regurgitation which hopefully should improve with revascularization. I elected to start Aggrastat for 6 hours.       2D ECHO LV EF: 25-30% Study Conclusions - Left ventricle: There is akinesis of the distal septum and apex. The cavity size was  normal. There was moderate concentric hypertrophy. Systolic function was severely reduced. The estimated ejection fraction was in the range of 25% to 30%. There is severe hypokinesis of the basal-midinferoseptal myocardium. There is severe hypokinesis of the basal-midinferior myocardium. There is akinesis of the apicalinferior myocardium. There is akinesis of the apicalanterior myocardium. There is severe hypokinesis of the midanteroseptal myocardium. Features are consistent with a pseudonormal left ventricular filling pattern, with concomitant abnormal relaxation and increased filling pressure (grade 2 diastolic dysfunction). Doppler parameters are consistent with high ventricular filling pressure. - Aortic valve: Severely calcified annulus. Probably trileaflet. Mild thickening and calcification. There was mild stenosis. There was moderate regurgitation. Valve area (VTI): 1.5 cm^2. Valve area (Vmax): 1.59 cm^2. Valve area (Vmean): 1.54 cm^2. - Aorta: Ascending aortic diameter: 40 mm (S). - Aortic root: The aortic root was mildly dilated. - Ascending aorta: The ascending aorta was  mildly dilated. - Mitral valve: There was mild regurgitation. - Left atrium: The atrium was mildly dilated. - Pulmonic valve: There was mild regurgitation. - Pulmonary arteries: PA peak pressure: 36 mm Hg (S). Impressions: - The right ventricular systolic pressure was increased consistent with mild pulmonary hypertension.    Current facility-administered medications:  .  0.9 %  sodium chloride infusion, 250 mL, Intravenous, PRN, Wellington Hampshire, MD, Last Rate: 10 mL/hr at 01/27/15 1800, 250 mL at 01/27/15 1800 .  acetaminophen (TYLENOL) tablet 650 mg, 650 mg, Oral, Q4H PRN, Jerline Pain, MD .  antiseptic oral rinse (CPC / CETYLPYRIDINIUM CHLORIDE 0.05%) solution 7 mL, 7 mL, Mouth Rinse, BID, Jerline Pain, MD, 7 mL at 01/29/15 2200 .  aspirin EC tablet 81 mg, 81 mg, Oral,  Daily, Jerline Pain, MD, 81 mg at 01/29/15 0941 .  atorvastatin (LIPITOR) tablet 80 mg, 80 mg, Oral, q1800, Jerline Pain, MD, 80 mg at 01/29/15 1722 .  carvedilol (COREG) tablet 6.25 mg, 6.25 mg, Oral, BID WC, Jerline Pain, MD, 6.25 mg at 01/30/15 0817 .  Influenza vac split quadrivalent PF (FLUARIX) injection 0.5 mL, 0.5 mL, Intramuscular, Tomorrow-1000, Jerline Pain, MD .  losartan (COZAAR) tablet 50 mg, 50 mg, Oral, Daily, Jerline Pain, MD, 50 mg at 01/29/15 0941 .  nitroGLYCERIN (NITROSTAT) SL tablet 0.4 mg, 0.4 mg, Sublingual, Q5 Min x 3 PRN, Jerline Pain, MD .  ondansetron (ZOFRAN) injection 4 mg, 4 mg, Intravenous, Q8H PRN, Kemper N Rumley, DO .  pneumococcal 23 valent vaccine (PNU-IMMUNE) injection 0.5 mL, 0.5 mL, Intramuscular, Tomorrow-1000, Jerline Pain, MD .  potassium chloride SA (K-DUR,KLOR-CON) CR tablet 40 mEq, 40 mEq, Oral, Once, Eileen Stanford, PA-C .  sodium chloride 0.9 % injection 3 mL, 3 mL, Intravenous, Q12H, Wellington Hampshire, MD, 3 mL at 01/30/15 0012 .  sodium chloride 0.9 % injection 3 mL, 3 mL, Intravenous, PRN, Wellington Hampshire, MD .  ticagrelor (BRILINTA) tablet 90 mg, 90 mg, Oral, BID, Wellington Hampshire, MD, 90 mg at 01/30/15 0011  ASSESSMENT AND PLAN  [name] is a 81 y.o. male with CAD s/p CABG in 2003, HTN, HLD and poor follow up presented with chest "ache" and pressure, nausea, vomiting, diarrhea found to have troponin elevation and evolving ST changes on ECG, taken to cath lab 9/17 with stenting to LAD and SVG graft to OM.   1. NSTEMI s/p overlapping DES x2 to LAD and DES to SVG-->OM (cath report above) - Trop >65 - Reduced EF 25-35% by LV gram and severe MR with elevated LV EDP (32) - Continue DAPT with Brilinta/ASA for at least 1 year. Continue coreg to 6.25 BID and losartan to 50mg . Only on Zetia as an outpatient. Not clear why he is not on a statin. Patient does not know and it is not listed in his allergies. Atorva 80mg  added yesterday  -  Femoral site stable.   2. Acute on chronic systolic HF/ischemic CM  - Reduced EF 25-35% by LV gram and severe MR with elevated LV EDP (32) - He had elevated LVEDP on LHC and was placed on IV lasix 40mg  IV BID. This was converted to PO yesterday. Net neg 4.6L. Weights are erratic but looks like he is down ~6 lbs.   - 2D ECHO 01/28/15 w/ EF 25-30%, severe HK of the basal-midinferoseptal myocardium, basal-midinferior myocardium and midanteroseptal myocardium. Akinesis of apical inferior and apicalanterior myocardium. G2DD. Mild AS, Asc aorta  diameter 40mm. Mild dilation of aortic root. Mild MR, mild PR and PA peak pressure 36 mmg HG.  - Continue coreg to 6.25 BID and losartan to 50mg  . Creat bumped. He appears euvolemic. Will hold lasix today as he may be a little dry. MD to decide if he needs maintenance dosing. Will arrange for a 1 week  TOC appointment.  3. Delirium - appears to be improved today. - underlying dementia at baseline.   4. NSVT - Improved on increased BB. Continue Coreg 6.25 mg BID  5. MR severe by LHC. - 2D ECHO on 01/29/15 with mild mitral regurgitation.   6. Hypokalemia- likely from diuresis. Will replete.   7. Dispo- likely able to go home today with TOC follow up in the clinic and BMET. MD to see.   Judy Pimple PA-C  Pager [phone]  Patient examined chart reviewed.  Would send home on lasix 20 mg daily given clinical CHF And low EF  BMET when they see PA/NP in a week.  No chest pain lungs clear Ready for d/c today  Jenkins Rouge

## 2015-01-30 NOTE — Discharge Summary (Signed)
Discharge Summary   Patient ID: [name] MRN: [id number], DOB/AGE: [birth year] 78 y.o. Admit date: 01/27/2015 D/C date:     01/30/2015  Primary Cardiologist: Dr. Percival Spanish  Principal Problem:   Non-ST elevation myocardial infarction (NSTEMI) Active Problems:   Essential hypertension   Hyperlipidemia   Nausea, vomiting and diarrhea   NSTEMI (non-ST elevated myocardial infarction)   Carotid stenosis   Dementia   CAD (coronary artery disease)   Ischemic cardiomyopathy    Admission Dates: 01/27/15-01/30/15 Discharge Diagnosis: NSTEMI s/p LHC with overlapping DES x2 to LAD and DESx1 to SVG-->OM   HPI: [name] is a 80 y.o. male with a history of CAD s/p CABG in 2003, HTN, HLD and poor follow up presented with chest "ache" and pressure, nausea, vomiting, diarrhea found to have troponin elevation and evolving ST changes on ECG, taken to cath lab 01/27/15 with stenting to LAD and SVG graft to OM.   He was last seen in the office on 09/08/13 by Dr. Percival Spanish after several years of being lost to follow up and not taking any of his medications. He was asymptomatic at that time except for mild LE edema (R>L). A 2D ECHO, carotid dopplers, and abdominal US were done (abdominal bruit heard on exam). 2D ECHO revealed EF 50-55%, G2DD, Mod AR, mod MR, mod LA dilation and mod RV dilation. Carotid duplex with stable 1-39% bilateral ICA stenosis in 09/2013, and abdominal US neg for AAA. Zetia was added as he had a hx of elevated CKs and liver enzymes on statin in the past.   He then presented to [facility] on 01/27/15 with nausea and vomitingx 3-4 days with associated diarrhea. He also reported ~2-3 days of chest ache/pressure. The patient and wife were difficult historians and it was challenging to get a clear timeline of events.   When he arrived at [facility] his troponin was elevated to 5. EKG w/ ST segment depression and T-wave inversion nonspecifically that was unchanged from previous EKGs. He was started on IV  heparin with plans for [facility] the following Monday. However, his troponin continued to rise and EKG also showed dynamic ST segment changes especially in the anterior precordial leads concerning for worsening ischemia. He had some associated burning chest pain and the cath lab was activated for urgent PCI.   Hospital Course  1. NSTEMI s/p overlapping DES x2 to LAD and DES to SVG-->OM.  LHC showed severe 3VCAD with CTO LCx and RCA. Severe subtotal occlusion in the mid LAD. Chronically occluded SVG to RCA (with L-->R collaterals) and SVG to LAD. Severe 90% stenosis in the SVG to OM. - Trop >65 - Reduced EF 25-35% by LV gram and severe MR with elevated LV EDP (32) - Continue DAPT with Brilinta/ASA for at least 1 year. Continue coreg to 6.25 BID and losartan to 50mg . Only on Zetia as an outpatient. He had elevated CKs and liver enzymes on statin. There was question of an ASA allergy, but upon further investigation it was decided that it was probably an intolerance. He has been tolerating ASA fine since admission  - Femoral site stable.   2. Acute on chronic systolic HF/ischemic CM - Reduced EF 25-35% by LV gram and severe MR with elevated LV EDP (32) - He had elevated LVEDP on LHC and was placed on IV lasix 40mg  IV BID. This was converted to PO on 01/30/15. Net neg 4.6L. Weights are erratic but looks like he is down ~6 lbs. 165 lbs today.  -  2D ECHO 01/28/15 w/ EF 25-30%, severe HK of the basal-midinferoseptal myocardium, basal-midinferior myocardium and midanteroseptal myocardium. Akinesis of apical inferior and apicalanterior myocardium. G2DD. Mild AS, Asc aorta diameter 15mm. Mild dilation of aortic root. Mild MR, mild PR and PA peak pressure 36 mmg HG. - Continue coreg to 6.25 BID and losartan to 50mg  . Creat bumped. He appears euvolemic. Will continue lasix 20mg  qd.   3. Delirium - appears to be improved today. - underlying dementia at baseline.   4. NSVT - Improved on increased BB. Continue  Coreg 6.25 mg BID  5. MR severe by LHC. - 2D ECHO on 01/29/15 with mild mitral regurgitation.   6. Hypokalemia- from IV diuresis. 3.4 today. Will replete. BMET at follow up  7. Carotid artery disease- carotid duplex with stable 1-39% bilateral ICA stenosis in 09/2013. Due for repeat scan in 09/2015.  8. HLD- LDL 162. No statin due to hx of elevated CKs and liver enzymes on statin. Continue zetia. Could be a candidate for PCSK 9 inhibitors.   9. N/V/Diarrhea- GI complaints likely secondary to ACS with no exam or laboratory signs of systemic inflammation. Now resolved.   The patient has had an uncomplicated hospital course and is recovering well. The femoral catheter site is stable. He has been seen by Dr. Johnsie Cancel today and deemed ready for discharge home. All follow-up appointments have been scheduled. A written RX for a 30 day free supply of Brilinta was provided for the patient. Discharge medications are listed below.   Discharge Vitals: Blood pressure 128/70, pulse 71, temperature 97.9 F (36.6 C), temperature source Oral, resp. rate 19, height 5\' 6"  (1.676 m), weight 164 lb 11.2 oz (74.707 kg), SpO2 95 %.  Labs: Lab Results  Component Value Date   WBC 6.4 01/30/2015   HGB 14.5 01/30/2015   HCT 43.0 01/30/2015   MCV 84.5 01/30/2015   PLT 164 01/30/2015    Recent Labs Lab 01/27/15 0934  01/29/15 1143  NA 138  < > 136  K 4.1  < > 3.4*  CL 103  < > 97*  CO2 23  < > 29  BUN 11  < > 21*  CREATININE 1.04  < > 1.51*  CALCIUM 8.9  < > 9.1  PROT 7.0  --   --   BILITOT 1.1  --   --   ALKPHOS 76  --   --   ALT 25  --   --   AST 125*  --   --   GLUCOSE 149*  < > 156*  < > = values in this interval not displayed.  Recent Labs  01/27/15 1255 01/27/15 1940 01/28/15 0051  TROPONINI 21.15* >65.00* >65.00*   Lab Results  Component Value Date   CHOL 241* 01/28/2015   HDL 67 01/28/2015   LDLCALC 162* 01/28/2015   TRIG 59 01/28/2015   No results found for: DDIMER  Diagnostic  Studies/Procedures   Dg Chest 2 View  01/27/2015   CLINICAL DATA:  LEFT-sided chest pain for 2 days, now worsening. Vomiting. Chest pressure. History of hypertension, prostate cancer, peptic ulcer disease.  EXAM: CHEST  2 VIEW  COMPARISON:  None available for comparison at time of study interpretation.  FINDINGS: The cardiac silhouette is mildly enlarged, status post median sternotomy for CABG. Tortuous aorta. Mildly increased lung volumes with flattened hemidiaphragm. Bibasilar atelectasis/ scarring, pleural thickening LEFT lung base without pleural effusion or focal consolidation. No pneumothorax. Soft tissue planes and included osseous structures are  nonsuspicious, mild degenerative change of the thoracic spine.  IMPRESSION: Mild cardiomegaly. Findings of COPD with bibasilar atelectasis/scarring.   Electronically Signed   By: Elon Alas M.D.   On: 01/27/2015 06:45    LHC (01/27/15) Conclusion     Prox RCA lesion, 100% stenosed.  Prox Cx lesion, 100% stenosed.  LM lesion, 40% stenosed.  SVG was injected .  Origin lesion, 100% stenosed.  SVG was injected is normal in caliber.  SVG was injected .  Origin lesion, 100% stenosed.  Mid LAD-2 lesion, 99% stenosed.  Mid LAD-1 lesion, 99% stenosed. There is a 0% residual stenosis post intervention. The lesion was not previously treated.  Mid Graft lesion, 90% stenosed. There is a 10% residual stenosis post intervention.  There is severe left ventricular systolic dysfunction. 1. Severe three-vessel coronary artery disease with chronically occluded left circumflex and right coronary artery. Severe subtotal occlusion in the mid LAD. 2. Chronically occluded SVG to RCA (with left-to-right collaterals) and SVG to LAD. Severe 90% stenosis in the SVG to OM. 3. Severely reduced LV systolic function with segmental wall motion abnormalities as outlined above and severe mitral regurgitation. 4. Severely elevated left ventricular  end-diastolic pressure. 5. Successful angioplasty and drug-eluting stent placement to the LAD 2 in an overlapped fashion and 1 drug-eluting stent placement to SVG to OM 2. Recommendations: Continue blood pressure control. I increased the dose of intravenous nitroglycerin. I started small dose carvedilol and losartan. There is reported allergy to ACE inhibitor use. It is not really clear if he is truly allergic to aspirin or not. He does not seem to be as I asked him and checked with his wife. It seems to be more likely intolerance. Continue dual antiplatelets therapy for at least one year. Check echocardiogram to evaluate his mitral regurgitation which hopefully should improve with revascularization. I elected to start Aggrastat for 6 hours.       2D ECHO LV EF: 25-30% Study Conclusions - Left ventricle: There is akinesis of the distal septum and apex. The cavity size was normal. There was moderate concentric hypertrophy. Systolic function was severely reduced. The estimated ejection fraction was in the range of 25% to 30%. There is severe hypokinesis of the basal-midinferoseptal myocardium. There is severe hypokinesis of the basal-midinferior myocardium. There is akinesis of the apicalinferior myocardium. There is akinesis of the apicalanterior myocardium. There is severe hypokinesis of the midanteroseptal myocardium. Features are consistent with a pseudonormal left ventricular filling pattern, with concomitant abnormal relaxation and increased filling pressure (grade 2 diastolic dysfunction). Doppler parameters are consistent with high ventricular filling pressure. - Aortic valve: Severely calcified annulus. Probably trileaflet. Mild thickening and calcification. There was mild stenosis. There was moderate regurgitation. Valve area (VTI): 1.5 cm^2. Valve area (Vmax): 1.59 cm^2. Valve area (Vmean): 1.54 cm^2. - Aorta: Ascending aortic diameter: 40 mm  (S). - Aortic root: The aortic root was mildly dilated. - Ascending aorta: The ascending aorta was mildly dilated. - Mitral valve: There was mild regurgitation. - Left atrium: The atrium was mildly dilated. - Pulmonic valve: There was mild regurgitation. - Pulmonary arteries: PA peak pressure: 36 mm Hg (S). Impressions: - The right ventricular systolic pressure was increased consistent with mild pulmonary hypertension.        Discharge Medications     Medication List    TAKE these medications        aspirin EC 81 MG tablet  Take 1 tablet (81 mg total) by mouth daily.     carvedilol  6.25 MG tablet  Commonly known as:  COREG  Take 1 tablet (6.25 mg total) by mouth 2 (two) times daily with a meal.     ezetimibe 10 MG tablet  Commonly known as:  ZETIA  Take 1 tablet (10 mg total) by mouth daily.     furosemide 20 MG tablet  Commonly known as:  LASIX  Take 1 tablet (20 mg total) by mouth daily.     losartan 50 MG tablet  Commonly known as:  COZAAR  Take 1 tablet (50 mg total) by mouth daily.     nitroGLYCERIN 0.4 MG SL tablet  Commonly known as:  NITROSTAT  Place 1 tablet (0.4 mg total) under the tongue every 5 (five) minutes x 3 doses as needed for chest pain.     ticagrelor 90 MG Tabs tablet  Commonly known as:  BRILINTA  Take 1 tablet (90 mg total) by mouth 2 (two) times daily.        Disposition   The patient will be discharged in stable condition to home.  Follow-up Information    Follow up with [name], PA-C On 02/06/2015.   Specialties:  Physician Assistant, Radiology, Interventional Cardiology   Why:  @ 9am    Contact information:   [id number] [address] [phone]         Duration of Discharge Encounter: Greater than 30 minutes including physician and PA time.  SignedAngelena Form R PA-C 01/30/2015, 10:29 AM

## 2015-01-30 NOTE — Progress Notes (Signed)
IV removed per discharge order. Discharge paperwork given and explained with teach back. Discharged via wheelchair to wife's care and NT Present.

## 2015-01-30 NOTE — Care Management Important Message (Signed)
Important Message  Patient Details  Name: [name] MRN: [id number] Date of Birth: 05-14-1934   Medicare Important Message Given:  Yes-second notification given    [name] 01/30/2015, 11:26 AM

## 2015-01-30 NOTE — Discharge Instructions (Signed)

## 2015-02-05 NOTE — Progress Notes (Signed)
Cardiology Office Note   Date:  02/06/2015   ID:  [name], DOB [birth year], MRN [id number]  PCP:  Shirline Frees, MD  Cardiologist:  Dr. [name]   Electrophysiologist:  n/a  Chief Complaint  Patient presents with  . Hospitalization Follow-up    s/p NSTEMI  . Coronary Artery Disease  . Congestive Heart Failure     History of Present Illness: [name] is a 80 y.o. male with a hx of CAD, status post CABG in 2003, aortic insufficiency, HTN, HL, carotid stenosis. Last seen by Dr. Percival Spanish in 2015. He's been nonadherent to follow-up and medications past. He has a history of elevated LFTs and total CK on statin therapy in the past.  Admitted 9/17-9/20 with a non-STEMI. LHC demonstrated severe 3 vessel CAD with a chronically occluded LCx, RCA and severe subtotal occlusion of the native mid LAD, chronically occluded SVG-RCA and SVG-LAD with severe stenosis and SVG-OM. He underwent PCI with DES 2 in overlapping fashion to the native LAD as well as DES to the SVG-OM. Echocardiogram confirmed reduced LV function with an EF of 123XX123, grade 2 diastolic dysfunction, mild aortic stenosis, moderate AI and mild MR. LVEDP was elevated at the time of his cardiac catheterization (32). He was diuresed with IV Lasix and was down 6 pounds (-4.6 L) by discharge.  Returns for follow-up.  Here today with his wife. He does note one episode of chest discomfort since discharge from the hospital. This was brief and he did take nitroglycerin for it. He denies any symptoms reminiscent of his angina that he had with his myocardial infarction. He has been walking some. He does note improved breathing but continues to have dyspnea with exertion. He is NYHA 2b. He denies orthopnea, PND. LE edema is improved. Weights have gone down since coming home from the hospital. He denies syncope. He denies coughing, wheezing, bleeding.   Studies/Reports Reviewed Today:  Echo 01/28/15 Inferoseptal HK, inferior HK,  apical AK, anteroseptal HK, moderate LVH, EF 123XX123 grade 2 diastolic dysfunction, mild aortic stenosis (mean 9 mmHg), moderate AI, mildly dilated aortic root, ascending aortic diameter 40 mm (mildly dilated), mild MR, mild LAE, mild PI, PASP 36 mmHg  LHC 01/27/15 LM: 40% LAD: Mid 99%, 99% >> Synergy DES x 2 (overlapping) LCx: 100% RCA: 100% SVG-LAD: 100% SVG-OM2 90% >> Synergy DES SVG-RCA 100% EF 25-35%, 4+ MR 1. Severe three-vessel coronary artery disease with chronically occluded left circumflex and right coronary artery. Severe subtotal occlusion in the mid LAD. 2. Chronically occluded SVG to RCA (with left-to-right collaterals) and SVG to LAD. Severe 90% stenosis in the SVG to OM. 3. Severely reduced LV systolic function with segmental wall motion abnormalities as outlined above and severe mitral regurgitation. 4. Severely elevated left ventricular end-diastolic pressure. 5. Successful angioplasty and drug-eluting stent placement to the LAD 2 in an overlapped fashion and 1 drug-eluting stent placement to SVG to OM2.       Abd Aorta US 09/19/13 No AAA  Carotid US 09/19/13 Bilateral ICA 1-39% FU 2 years   Past Medical History  Diagnosis Date  . Hypertension   . Prostate ca     a. s/p seed implants  . Hyperlipidemia   . Peptic ulcer disease   . Internal hemorrhoids   . Carotid stenosis     a. 09/2013: carotid dopplers with stable 1-39% ICA stenosis bilaterally  . CAD (coronary artery disease)     a. CABG (2003)  b. 9/17/6: NSTEMI s/p overlapping DES  x2 to LAD and DESx1 to SVG-->OM   . Dementia   . Ischemic cardiomyopathy     a. 2D ECHO 01/28/15 w/ EF 25-30%, severe HK of the basal-midinferoseptal myocardium, basal-midinferior myocardium and midanteroseptal myocardium. Akinesis of apical inferior and apicalanterior myocardium. G2DD. Mild AS, Asc aorta diameter 11mm. Mild dilation of aortic root. Mild MR, mild PR and PA peak pressure 36 mmg HG.    Past Surgical History    Procedure Laterality Date  . Cardiac surgery  2003    SVG LAD, SVG to right coronary artery, SVG to marginal  . Cardiac catheterization N/A 01/27/2015    Procedure: Left Heart Cath and Coronary Angiography;  Surgeon: Wellington Hampshire, MD;  Location: [facility];  Service: Cardiovascular;  Laterality: N/A;     Current Outpatient Prescriptions  Medication Sig Dispense Refill  . aspirin EC 81 MG tablet Take 1 tablet (81 mg total) by mouth daily. 90 tablet 3  . carvedilol (COREG) 6.25 MG tablet Take 1 tablet (6.25 mg total) by mouth 2 (two) times daily with a meal. 60 tablet 11  . furosemide (LASIX) 20 MG tablet Take 1 tablet (20 mg total) by mouth daily. 30 tablet 3  . losartan (COZAAR) 50 MG tablet Take 1 tablet (50 mg total) by mouth daily. 30 tablet 3  . nitroGLYCERIN (NITROSTAT) 0.4 MG SL tablet Place 1 tablet (0.4 mg total) under the tongue every 5 (five) minutes x 3 doses as needed for chest pain. 25 tablet 12  . ticagrelor (BRILINTA) 90 MG TABS tablet Take 1 tablet (90 mg total) by mouth 2 (two) times daily. 60 tablet 11  . ezetimibe (ZETIA) 10 MG tablet Take 1 tablet (10 mg total) by mouth daily. 30 tablet 11   No current facility-administered medications for this visit.    Allergies:   Aspirin and Ramipril    Social History:  The patient  reports that he has quit smoking. He does not have any smokeless tobacco history on file. He reports that he does not drink alcohol.   Family History:  The patient's family history includes Stroke in his father.    ROS:   Please see the history of present illness.   Review of Systems  Cardiovascular: Positive for chest pain and leg swelling.  Respiratory: Positive for shortness of breath.   Gastrointestinal: Positive for nausea.  All other systems reviewed and are negative.     PHYSICAL EXAM: VS:  BP 112/48 mmHg  Pulse 61  Ht 5\' 6"  (1.676 m)  Wt 167 lb 6.4 oz (75.932 kg)  BMI 27.03 kg/m2  SpO2 94%    Wt Readings from Last  3 Encounters:  02/06/15 167 lb 6.4 oz (75.932 kg)  01/30/15 164 lb 11.2 oz (74.707 kg)  09/08/13 187 lb 6.4 oz (85.004 kg)     GEN: Well nourished, well developed, in no acute distress HEENT: normal Neck: no JVD,  no masses Cardiac:  Normal [id number], RRR; 2/6 systolic murmur LSB,  no rubs or gallops,  Trace bilateral ankle edema; R groin without hematoma or bruit  Respiratory:  clear to auscultation bilaterally, no wheezing, rhonchi or rales. GI: soft, nontender, nondistended, + BS MS: no deformity or atrophy Skin: warm and dry  Neuro:  CNs II-XII intact, Strength and sensation are intact Psych: Normal affect   EKG:  EKG is ordered today.  It demonstrates:   NSR, HR 61, LAD, QTc 420 ms, Q waves in V1-V2, T-wave inversions in 1, aVL, 2,  aVF, V3-V6, J-point elevation V1-V3, ST changes improved when compared to prior tracing 01/29/15    Recent Labs: 01/27/2015: ALT 25; Magnesium 1.7; TSH 1.864 01/30/2015: BUN 22*; Creatinine, Ser 1.45*; Hemoglobin 14.5; Platelets 164; Potassium 3.8; Sodium 138    Lipid Panel    Component Value Date/Time   CHOL 241* 01/28/2015 0051   TRIG 59 01/28/2015 0051   HDL 67 01/28/2015 0051   CHOLHDL 3.6 01/28/2015 0051   VLDL 12 01/28/2015 0051   LDLCALC 162* 01/28/2015 0051      ASSESSMENT AND PLAN:  1. CAD Status Post CABG: Status post recent admission with non-STEMI treated with DES 2 to the native LAD and DES to SVG-OM. Overall, he has been doing well since discharge from the hospital. He denies significant chest discomfort. He has had one episode of anginal chest pain relieved by nitroglycerin. He does have a chronically occluded RCA and occluded SVG-RCA.  I suppose that has the potential for chronic angina. At this point, I am not convinced that he needs daily nitrates. This can certainly be considered in the future. Continue beta blocker, ARB, aspirin, Brilinta.  We discussed the importance of dual antiplatelet therapy. Referred to cardiac  rehabilitation.  2. Ischemic Cardiomyopathy: EF 25-30% by recent echocardiogram. Continue beta blocker, ARB.   Plan repeat echocardiogram 90 days post PCI. If EF remains <35%, consider referral to EP for possible ICD implantation.   Follow-up BMET will be obtained today. If renal function and potassium are stable, consider the addition of spironolactone.  3. Chronic Combined Systolic and Diastolic CHF:  Volume appears stable. He is NYHA 2b. Continue current dose of Lasix.   -  Follow-up BMET today.  4. Aortic Insufficiency: Moderate by recent echocardiogram. This will need to be followed clinically as well as with serial echocardiograms over time.  5. Mitral Regurgitation: Mild echocardiogram.  6. HTN:   Controlled.  7. Hyperlipidemia: Not on statin therapy secondary to elevated LFTs and CPK in the past. Discussed referral to lipid clinic for consideration of PCSK 9 therapy today.  He was apparently discharged on Zetia but never given a prescription. He prefers to remain on Zetia for now. If LDL does not approach goal, can reconsider referral to lipid clinic at that point.  Check Lipids and LFTs in 6 weeks.    8. Carotid stenosis: Mild by recent Doppler study. He will need follow-up carotid US in 5/17.      Medication Changes: Current medicines are reviewed at length with the patient today.  Concerns regarding medicines are as outlined above.  The following changes have been made:   Discontinued Medications   EZETIMIBE (ZETIA) 10 MG TABLET    Take 1 tablet (10 mg total) by mouth daily.   Modified Medications   No medications on file   New Prescriptions   EZETIMIBE (ZETIA) 10 MG TABLET    Take 1 tablet (10 mg total) by mouth daily.    Labs/ tests ordered today include:   Orders Placed This Encounter  Procedures  . Basic Metabolic Panel (BMET)  . Lipid Profile  . Hepatic function panel  . EKG 12-Lead  . Echocardiogram      Disposition:    FU with Dr. [name] 8  weeks.    Signed, Versie Starks, MHS 02/06/2015 9:57 AM    [facility] [address], [place], [place]  [address] Phone: [phone]; Fax: [phone]

## 2015-02-06 ENCOUNTER — Telehealth: Payer: Self-pay | Admitting: *Deleted

## 2015-02-06 ENCOUNTER — Other Ambulatory Visit: Payer: Self-pay

## 2015-02-06 ENCOUNTER — Ambulatory Visit (INDEPENDENT_AMBULATORY_CARE_PROVIDER_SITE_OTHER): Payer: Medicare Other | Admitting: Physician Assistant

## 2015-02-06 ENCOUNTER — Encounter: Payer: Self-pay | Admitting: Physician Assistant

## 2015-02-06 VITALS — BP 112/48 | HR 61 | Ht 66.0 in | Wt 167.4 lb

## 2015-02-06 DIAGNOSIS — I351 Nonrheumatic aortic (valve) insufficiency: Secondary | ICD-10-CM

## 2015-02-06 DIAGNOSIS — I340 Nonrheumatic mitral (valve) insufficiency: Secondary | ICD-10-CM

## 2015-02-06 DIAGNOSIS — I255 Ischemic cardiomyopathy: Secondary | ICD-10-CM

## 2015-02-06 DIAGNOSIS — I6523 Occlusion and stenosis of bilateral carotid arteries: Secondary | ICD-10-CM

## 2015-02-06 DIAGNOSIS — I2510 Atherosclerotic heart disease of native coronary artery without angina pectoris: Secondary | ICD-10-CM | POA: Diagnosis not present

## 2015-02-06 DIAGNOSIS — I10 Essential (primary) hypertension: Secondary | ICD-10-CM

## 2015-02-06 DIAGNOSIS — I5042 Chronic combined systolic (congestive) and diastolic (congestive) heart failure: Secondary | ICD-10-CM | POA: Diagnosis not present

## 2015-02-06 DIAGNOSIS — E785 Hyperlipidemia, unspecified: Secondary | ICD-10-CM

## 2015-02-06 LAB — BASIC METABOLIC PANEL
BUN: 31 mg/dL — ABNORMAL HIGH (ref 6–23)
CALCIUM: 9.4 mg/dL (ref 8.4–10.5)
CO2: 30 meq/L (ref 19–32)
Chloride: 100 mEq/L (ref 96–112)
Creatinine, Ser: 1.58 mg/dL — ABNORMAL HIGH (ref 0.40–1.50)
GFR: 54.78 mL/min — ABNORMAL LOW (ref >60.00)
GLUCOSE: 109 mg/dL — AB (ref 70–99)
POTASSIUM: 4.1 meq/L (ref 3.5–5.1)
SODIUM: 136 meq/L (ref 135–145)

## 2015-02-06 MED ORDER — EZETIMIBE 10 MG PO TABS: 10.0000 mg | ORAL_TABLET | Freq: Every day | ORAL | Status: DC

## 2015-02-06 NOTE — Telephone Encounter (Signed)
Lmptcb to go over lab results 

## 2015-02-06 NOTE — Patient Instructions (Signed)
Medication Instructions:  1. START ZETIA 10 MG DAILY; RX SENT  Labwork: 1. TODAY BMET  2. 6 WEEKS 03/23/15 FOR FASTING CHOLESTEROL PANEL  Testing/Procedures: Your physician has requested that you have an echocardiogram THIS IS TO BE DONE AFTER 04/28/15. Echocardiography is a painless test that uses sound waves to create images of your heart. It provides your doctor with information about the size and shape of your heart and how well your heart's chambers and valves are working. This procedure takes approximately one hour. There are no restrictions for this procedure.   Follow-Up: 8 WEEKS WITH DR. HOCHREIN  Any Other Special Instructions Will Be Listed Below (If Applicable). C[facility] AT [place]

## 2015-02-09 ENCOUNTER — Telehealth: Payer: Self-pay | Admitting: *Deleted

## 2015-02-09 ENCOUNTER — Encounter: Payer: Self-pay | Admitting: *Deleted

## 2015-02-09 NOTE — Telephone Encounter (Signed)
Lmptcb x 3 . I will send out letter today.

## 2015-02-09 NOTE — Telephone Encounter (Signed)
Signed order was faxed back to [facility] cardiac rehab Phase II

## 2015-02-12 NOTE — Telephone Encounter (Signed)
Lmptcb for lab results 

## 2015-02-23 NOTE — Telephone Encounter (Signed)
I tried to reach pt again to go over lab results and recommnedations for repeat bmet.

## 2015-03-07 ENCOUNTER — Telehealth: Payer: Self-pay | Admitting: *Deleted

## 2015-03-07 ENCOUNTER — Other Ambulatory Visit: Payer: Self-pay | Admitting: *Deleted

## 2015-03-07 NOTE — Telephone Encounter (Signed)
pt walked in wanting samples of brilinta, he has a script on file. will give 12 days so he has time to get his refill.

## 2015-03-07 NOTE — Telephone Encounter (Signed)
wife stopped by for samples, needs to see if there is an asst program for the copay on this medication, please call pt

## 2015-03-08 NOTE — Telephone Encounter (Signed)
Left message for patient and wife to call us back. I'm guessing that he needs Brilinta, but that was not clear on the phone message.

## 2015-03-20 ENCOUNTER — Other Ambulatory Visit (INDEPENDENT_AMBULATORY_CARE_PROVIDER_SITE_OTHER): Payer: Medicare Other | Admitting: *Deleted

## 2015-03-20 DIAGNOSIS — I10 Essential (primary) hypertension: Secondary | ICD-10-CM | POA: Diagnosis not present

## 2015-03-20 LAB — LIPID PANEL
Cholesterol: 172 mg/dL (ref 125–200)
HDL: 44 mg/dL (ref >=40)
LDL CALC: 116 mg/dL (ref <130)
Total CHOL/HDL Ratio: 3.9 Ratio (ref <=5.0)
Triglycerides: 62 mg/dL (ref <150)
VLDL: 12 mg/dL (ref <30)

## 2015-03-20 LAB — HEPATIC FUNCTION PANEL
ALK PHOS: 72 U/L (ref 40–115)
ALT: 20 U/L (ref 9–46)
AST: 21 U/L (ref 10–35)
Albumin: 3.4 g/dL — ABNORMAL LOW (ref 3.6–5.1)
BILIRUBIN INDIRECT: 0.4 mg/dL (ref 0.2–1.2)
BILIRUBIN TOTAL: 0.5 mg/dL (ref 0.2–1.2)
Bilirubin, Direct: 0.1 mg/dL (ref <=0.2)
Total Protein: 6.7 g/dL (ref 6.1–8.1)

## 2015-03-21 ENCOUNTER — Telehealth: Payer: Self-pay | Admitting: *Deleted

## 2015-03-21 NOTE — Telephone Encounter (Signed)
Michae Kava, CMA at 02/23/2015 2:54 PM     Status: Signed       Expand All Collapse All   I tried to reach pt again to go over lab results and recommnedations for repeat bmet.             Michae Kava, CMA at 02/12/2015 3:07 PM     Status: Signed       Expand All Collapse All   Lmptcb for lab results            Michae Kava, CMA at 02/09/2015 2:02 PM     Status: Signed       Expand All Collapse All   Lmptcb x 3 . I will send out letter today.             Michae Kava, CMA at 02/06/2015 6:04 PM     Status: Signed       Expand All Collapse All   Lmptcb to go over lab results

## 2015-04-04 ENCOUNTER — Encounter: Payer: Self-pay | Admitting: *Deleted

## 2015-04-04 ENCOUNTER — Telehealth: Payer: Self-pay | Admitting: Cardiology

## 2015-04-04 NOTE — Telephone Encounter (Signed)
Called pt and spoke with pt's wife [name], informing her that we do not have any samples of the Brilinta and if she wants to call back next week to see if we have gotten any samples in. I advised the wife that if the pt has any other problems, questions or concerns to call the office. Wife verbalized understanding.

## 2015-04-04 NOTE — Telephone Encounter (Signed)
New Message/2 part message   Patient calling the office for samples of medication:   1.  What medication and dosage are you requesting samples for? Brulenta  2.  Are you currently out of this medication? Yes    Pt's wife also wanting to speak to someone in prior authorizations for medications to find out if they can possibly get this medication any cheaper because they aren't able to afford it.

## 2015-04-09 ENCOUNTER — Encounter: Payer: Self-pay | Admitting: Cardiology

## 2015-04-09 ENCOUNTER — Ambulatory Visit (INDEPENDENT_AMBULATORY_CARE_PROVIDER_SITE_OTHER): Payer: Medicare Other | Admitting: Cardiology

## 2015-04-09 VITALS — BP 140/62 | HR 64 | Ht 65.0 in | Wt 169.0 lb

## 2015-04-09 DIAGNOSIS — R112 Nausea with vomiting, unspecified: Secondary | ICD-10-CM | POA: Diagnosis not present

## 2015-04-09 DIAGNOSIS — E785 Hyperlipidemia, unspecified: Secondary | ICD-10-CM

## 2015-04-09 DIAGNOSIS — I25110 Atherosclerotic heart disease of native coronary artery with unstable angina pectoris: Secondary | ICD-10-CM

## 2015-04-09 DIAGNOSIS — R197 Diarrhea, unspecified: Secondary | ICD-10-CM

## 2015-04-09 DIAGNOSIS — Z79899 Other long term (current) drug therapy: Secondary | ICD-10-CM

## 2015-04-09 MED ORDER — CARVEDILOL 3.125 MG PO TABS: 3.1250 mg | ORAL_TABLET | Freq: Two times a day (BID) | ORAL | Status: DC

## 2015-04-09 NOTE — Progress Notes (Signed)
Cardiology Office Note   Date:  04/09/2015   ID:  Humberto Wedlake, DOB November 02, 1934, MRN [id number]  PCP:  Shirline Frees, MD  Cardiologist:   [name], MD   Chief Complaint  Patient presents with  . Coronary Artery Disease      History of Present Illness: [name] is a 80 y.o. male who presents for follow-up of coronary disease. He has a history of CABG but was not particularly compliant with follow-up. He was admitted in September with and stenting. Cardiac Is as described below. He underwent DES stenting with 2 overlapping stents to the LAD. He also had a drug-eluting stent to SVG to OM. His EF was 25-30% and he was managed for heart failure.  Since he was seen in follow-up in the office he said no acute complaints. He says he hasn't had a nitroglycerin in a couple of weeks. At that time he was having some atypical type symptoms. He denies any ongoing dyspnea, PND or orthopnea. He's not had any substernal chest pressure, neck or arm discomfort. He's had no presyncope or syncope although there is occasional mild dizziness.  Past Medical History  Diagnosis Date  . Hypertension   . Prostate CA ([place])     a. s/p seed implants  . Hyperlipidemia   . Peptic ulcer disease   . Internal hemorrhoids   . Carotid stenosis     a. 09/2013: carotid dopplers with stable 1-39% ICA stenosis bilaterally  . CAD (coronary artery disease)     a. CABG (2003)  b. 9/17/6: NSTEMI s/p overlapping DES x2 to LAD and DESx1 to SVG-->OM   . Dementia   . Ischemic cardiomyopathy     a. 2D ECHO 01/28/15 w/ EF 25-30%, severe HK of the basal-midinferoseptal myocardium, basal-midinferior myocardium and midanteroseptal myocardium. Akinesis of apical inferior and apicalanterior myocardium. G2DD. Mild AS, Asc aorta diameter 39mm. Mild dilation of aortic root. Mild MR, mild PR and PA peak pressure 36 mmg HG.    Past Surgical History  Procedure Laterality Date  . Cardiac surgery  2003    SVG LAD, SVG to right  coronary artery, SVG to marginal  . Cardiac catheterization N/A 01/27/2015    Procedure: Left Heart Cath and Coronary Angiography;  Surgeon: Wellington Hampshire, MD;  Location: [facility];  Service: Cardiovascular;  Laterality: N/A;     Current Outpatient Prescriptions  Medication Sig Dispense Refill  . aspirin EC 81 MG tablet Take 1 tablet (81 mg total) by mouth daily. 90 tablet 3  . carvedilol (COREG) 6.25 MG tablet Take 1 tablet (6.25 mg total) by mouth 2 (two) times daily with a meal. 60 tablet 11  . furosemide (LASIX) 20 MG tablet Take 1 tablet (20 mg total) by mouth daily. 30 tablet 3  . losartan (COZAAR) 50 MG tablet Take 1 tablet (50 mg total) by mouth daily. 30 tablet 3  . nitroGLYCERIN (NITROSTAT) 0.4 MG SL tablet Place 1 tablet (0.4 mg total) under the tongue every 5 (five) minutes x 3 doses as needed for chest pain. 25 tablet 12  . ticagrelor (BRILINTA) 90 MG TABS tablet Take 1 tablet (90 mg total) by mouth 2 (two) times daily. 60 tablet 11  . carvedilol (COREG) 3.125 MG tablet Take 1 tablet (3.125 mg total) by mouth 2 (two) times daily. 180 tablet 3   No current facility-administered medications for this visit.    Allergies:   Ramipril    ROS:  Please see the history  of present illness.   Otherwise, review of systems are positive for none.   All other systems are reviewed and negative.    PHYSICAL EXAM: VS:  BP 140/62 mmHg  Pulse 64  Ht 5\' 5"  (1.651 m)  Wt 169 lb (76.658 kg)  BMI 28.12 kg/m2 , BMI Body mass index is 28.12 kg/(m^2). GENERAL:  Well appearing NECK:  No jugular venous distention, waveform within normal limits, carotid upstroke brisk and symmetric, no bruits, no thyromegaly LUNGS:  Clear to auscultation bilaterally BACK:  No CVA tenderness CHEST:  Unremarkable, well healed sternotomy scar, HEART:  PMI not displaced or sustained,S1 and S2 within normal limits, no S3, no S4, no clicks, no rubs, soft left upper sternal border early peaking systolic  murmur, no diastolic murmurs ABD:  Flat, positive bowel sounds normal in frequency in pitch, no bruits, no rebound, no guarding, no midline pulsatile mass, no hepatomegaly, no splenomegaly EXT:  2 plus pulses throughout, mild ankle edema, no cyanosis no clubbing   EKG:  EKG is not ordered today.   Recent Labs: 01/27/2015: Magnesium 1.7; TSH 1.864 01/30/2015: Hemoglobin 14.5; Platelets 164 02/06/2015: BUN 31*; Creatinine, Ser 1.58*; Potassium 4.1; Sodium 136 03/20/2015: ALT 20    Lipid Panel    Component Value Date/Time   CHOL 172 03/20/2015 0812   TRIG 62 03/20/2015 0812   HDL 44 03/20/2015 0812   CHOLHDL 3.9 03/20/2015 0812   VLDL 12 03/20/2015 0812   LDLCALC 116 03/20/2015 0812      Wt Readings from Last 3 Encounters:  04/09/15 169 lb (76.658 kg)  02/06/15 167 lb 6.4 oz (75.932 kg)  01/30/15 164 lb 11.2 oz (74.707 kg)      Other studies Reviewed: Additional studies/ records that were reviewed today include: Hospital records.  (Greater than 40 minutes reviewing all data with greater than 50% face to face with the patient). Review of the above records demonstrates:  Please see elsewhere in the note.     ASSESSMENT AND PLAN:  CAD Status Post CABG: Status post recent admission with non-STEMI treated with DES 2 to the native LAD and DES to SVG-OM:    For now he will continue with risk reduction.   Ischemic Cardiomyopathy: EF 25-30% by recent echocardiogram. Continue beta blocker, ARB. Today I will increase his carvedilol. I will get a repeat echocardiogram in 2 weeks. If his ejection fraction remains low he will be considered for an ICD. I will not have much room on his meds because of symptoms.  Aortic Insufficiency: Moderate by recent echocardiogram. This will need to be followed clinically as well as with serial echocardiograms over time.  Mitral Regurgitation: Mild echocardiogram.  HTN: This is being managed in the context of treating his CHF  Hyperlipidemia: Not  on statin therapy secondary to elevated LFTs and CPK in the past. Previously we discussed referral to lipid clinic for consideration of PCSK 9 therapy today. He was apparently discharged on Zetia but never given a prescription. He prefers to remain on Zetia for now. If LDL does not approach goal, can reconsider referral to lipid clinic at that point.  Carotid stenosis: Mild by recent Doppler study. He will need follow-up carotid US in 5/17.   Current medicines are reviewed at length with the patient today.  The patient does not have concerns regarding medicines.  The following changes have been made:  As above  Labs/ tests ordered today include:   Orders Placed This Encounter  Procedures  . Lipid Profile  .  Hepatic function panel     Disposition:   FU with me in January    Signed, Loye Vento, MD  04/09/2015 10:43 AM    [place]

## 2015-04-09 NOTE — Patient Instructions (Signed)
Your physician recommends that you schedule a follow-up appointment in: January 2017  Your physician recommends that you return for lab work in: Fasting Lipids Liver  Your physician has recommended you make the following change in your medication: Increase carvedilol 9.375 mg twice a day

## 2015-04-30 ENCOUNTER — Ambulatory Visit (HOSPITAL_COMMUNITY): Payer: Medicare Other | Attending: Cardiovascular Disease

## 2015-04-30 DIAGNOSIS — R0989 Other specified symptoms and signs involving the circulatory and respiratory systems: Secondary | ICD-10-CM

## 2015-05-01 ENCOUNTER — Encounter (HOSPITAL_COMMUNITY): Payer: Self-pay

## 2015-05-10 ENCOUNTER — Telehealth: Payer: Self-pay | Admitting: Physician Assistant

## 2015-05-10 MED ORDER — FUROSEMIDE 20 MG PO TABS: 20.0000 mg | ORAL_TABLET | Freq: Every day | ORAL | Status: DC

## 2015-05-10 NOTE — Telephone Encounter (Signed)
LEFT 2 BOTTLES OF BRILINTA 90 MG UP FRONT, PT AWARE. SENDING TO LR FOR ASST PROGRAM AS WELL.

## 2015-05-10 NOTE — Telephone Encounter (Signed)
Patient calling the office for samples of medication:   1.  What medication and dosage are you requesting samples for? Brilinta 90mg  2.  Are you currently out of this medication? yes    *STAT* If patient is at the pharmacy, call can be transferred to refill team 1. Which medications need to be refilled? (please list name of each medication and dose if known)  Furosemide 20mg  Losartan 50mg   2. Which pharmacy/location (including street and city if local pharmacy) is medication to be sent to?[facility]/[address]  3. Do they need a 30 day or 90 day supply? 30day

## 2015-05-16 ENCOUNTER — Encounter (HOSPITAL_COMMUNITY): Payer: Self-pay

## 2015-05-29 ENCOUNTER — Telehealth: Payer: Self-pay | Admitting: Cardiology

## 2015-05-29 MED ORDER — FUROSEMIDE 20 MG PO TABS: 20.0000 mg | ORAL_TABLET | Freq: Every day | ORAL | Status: DC

## 2015-05-29 MED ORDER — CARVEDILOL 3.125 MG PO TABS: 3.1250 mg | ORAL_TABLET | Freq: Two times a day (BID) | ORAL | Status: DC

## 2015-05-29 MED ORDER — LOSARTAN POTASSIUM 50 MG PO TABS: 50.0000 mg | ORAL_TABLET | Freq: Every day | ORAL | Status: DC

## 2015-05-29 NOTE — Telephone Encounter (Signed)
New Message  Pt c/o of Chest Pain: STAT if CP now or developed within 24 hours  1. Are you having CP right now? No   2. Are you experiencing any other symptoms (ex. SOB, nausea, vomiting, sweating)? No   3. How long have you been experiencing CP? Didn't last long at all   4. Is your CP continuous or coming and going? It was just that one moment   5. Have you taken Nitroglycerin? Yes   Comment: After the nitro was given the pain went away. Made appt for 01/18  @ 10:10 with Kathlen Mody.  ?

## 2015-05-29 NOTE — Telephone Encounter (Signed)
Also needs refill for Lasix and Cozaar,  Sent to pharmacy Also reminded if pain comes back or becomes worse not relieved by nitorglycerin needs to go to the ED

## 2015-05-29 NOTE — Progress Notes (Signed)
Cardiology Office Note:    Date:  05/30/2015   ID:  [name], DOB 18-Jul-1934, MRN [id number]  PCP:  Shirline Frees, MD  Cardiologist:  Dr. [name]   Electrophysiologist:  n/a  Chief Complaint  Patient presents with  . Chest Pain  . Coronary Artery Disease  . Congestive Heart Failure    History of Present Illness:    [name] is a 81 y.o. male with a hx of CAD, s/p CABG in 2003, aortic insufficiency, HTN, HL, carotid stenosis.  He has been nonadherent to follow-up and medications in the past. He has a history of elevated LFTs and total CK on statin therapy in the past.  Admitted 9/16 with a non-STEMI. LHC demonstrated severe 3 vessel CAD with a chronically occluded LCx, RCA and severe subtotal occlusion of the native mid LAD, chronically occluded SVG-RCA and SVG-LAD with severe stenosis and SVG-OM. He underwent PCI with DES 2 in overlapping fashion to the native LAD as well as DES to the SVG-OM. Echocardiogram confirmed reduced LV function with an EF of 123XX123, grade 2 diastolic dysfunction, mild aortic stenosis, moderate AI and mild MR. LVEDP was elevated at the time of his cardiac catheterization (32). He was diuresed with IV Lasix and was down 6 pounds (-4.6 L) by discharge.  Last seen by Dr. [name] 04/09/15.  Plan was to repeat an echo to reassess LVF.  This was never done by the patient.  he called in recently with c/o's of chest pain and is added on for further evaluation.    He is here today with his wife. He had an episode of substernal chest discomfort yesterday while standing in line at a restaurant. He went home and took nitroglycerin with resolution. He's not sure but his pain lasted anywhere from 10 minutes to 30 minutes.  He does admit to occasional chest discomfort since his non-STEMI. He denies associated radiating symptoms, nausea or diaphoresis. He denies orthopnea or PND. He has stable LE edema. He denies syncope. He has noted recent epistaxes. He  comes in today with packing in bilateral nares. He did this on his own. He also notes questionable hematuria. History of this is somewhat difficult to obtain. He seems to have had an episode of hematuria a couple of months ago. He does note difficulty with urine flow now.   Past Medical History  Diagnosis Date  . Hypertension   . Prostate CA ([place])     a. s/p seed implants  . Hyperlipidemia   . Peptic ulcer disease   . Internal hemorrhoids   . Carotid stenosis     a. 09/2013: carotid dopplers with stable 1-39% ICA stenosis bilaterally  . CAD (coronary artery disease)     a. CABG (2003)  b. 9/17/6: NSTEMI s/p overlapping DES x2 to LAD and DESx1 to SVG-->OM   . Dementia   . Ischemic cardiomyopathy     a. 2D ECHO 01/28/15 w/ EF 25-30%, severe HK of the basal-midinferoseptal myocardium, basal-midinferior myocardium and midanteroseptal myocardium. Akinesis of apical inferior and apicalanterior myocardium. G2DD. Mild AS, Asc aorta diameter 20mm. Mild dilation of aortic root. Mild MR, mild PR and PA peak pressure 36 mmg HG.    Past Surgical History  Procedure Laterality Date  . Cardiac surgery  2003    SVG LAD, SVG to right coronary artery, SVG to marginal  . Cardiac catheterization N/A 01/27/2015    Procedure: Left Heart Cath and Coronary Angiography;  Surgeon: Wellington Hampshire, MD;  Location: [facility];  Service: Cardiovascular;  Laterality: N/A;    Current Medications: Outpatient Prescriptions Prior to Visit  Medication Sig Dispense Refill  . aspirin EC 81 MG tablet Take 1 tablet (81 mg total) by mouth daily. 90 tablet 3  . furosemide (LASIX) 20 MG tablet Take 1 tablet (20 mg total) by mouth daily. 30 tablet 11  . losartan (COZAAR) 50 MG tablet Take 1 tablet (50 mg total) by mouth daily. 30 tablet 3  . nitroGLYCERIN (NITROSTAT) 0.4 MG SL tablet Place 1 tablet (0.4 mg total) under the tongue every 5 (five) minutes x 3 doses as needed for chest pain. 25 tablet 12  . ticagrelor  (BRILINTA) 90 MG TABS tablet Take 1 tablet (90 mg total) by mouth 2 (two) times daily. 60 tablet 11  . carvedilol (COREG) 3.125 MG tablet Take 1 tablet (3.125 mg total) by mouth 2 (two) times daily. (Patient not taking: Reported on 05/30/2015) 60 tablet 11   No facility-administered medications prior to visit.     Allergies:   Ramipril   Social History   Social History  . Marital Status: Married    Spouse Name: N/A  . Number of Children: N/A  . Years of Education: N/A   Occupational History  . Retired    Social History Main Topics  . Smoking status: Former Research scientist (life sciences)  . Smokeless tobacco: None  . Alcohol Use: No  . Drug Use: None  . Sexual Activity: Not Asked   Other Topics Concern  . None   Social History Narrative     Family History:  The patient's family history includes Stroke in his father.   ROS:   Please see the history of present illness.    Review of Systems  HENT: Positive for hearing loss.   Hematologic/Lymphatic: Positive for bleeding problem.  Skin: Positive for rash.  Genitourinary: Positive for hematuria.  All other systems reviewed and are negative.   Physical Exam:    VS:  BP 130/42 mmHg  Pulse 58  Ht 5\' 5"  (1.651 m)  Wt 162 lb 3.2 oz (73.573 kg)  BMI 26.99 kg/m2   GEN: Well nourished, well developed, in no acute distress HEENT: normal- bilateral nares packing noted Neck: no JVD, no masses Cardiac: Normal 99991111, RRR; 2/6 systolic murmur heard best at the LSB and apex, 2/6 diastolic murmur loudest actually at the RUSB; trace LE edema bilaterally   Respiratory:  clear to auscultation bilaterally; no wheezing, rhonchi or rales GI: soft, nontender, nondistended  MS: no deformity or atrophy Skin: warm and dry, no rash Neuro:   no focal deficits  Psych: Alert and oriented x 3, normal affect  Wt Readings from Last 3 Encounters:  05/30/15 162 lb 3.2 oz (73.573 kg)  04/09/15 169 lb (76.658 kg)  02/06/15 167 lb 6.4 oz (75.932 kg)       Studies/Labs Reviewed:    EKG:  EKG is ordered today.  The ekg ordered today demonstrates sinus bradycardia, HR 58, leftward axis, T-wave inversions 1, aVL, V4-V6, anteroseptal Q waves, first-degree AV block with PR 214 ms, QTC 410 ms, no significant change compared to prior tracings  Recent Labs: 01/27/2015: Magnesium 1.7; TSH 1.864 01/30/2015: Hemoglobin 14.5; Platelets 164 02/06/2015: BUN 31*; Creatinine, Ser 1.58*; Potassium 4.1; Sodium 136 03/20/2015: ALT 20   Recent Lipid Panel    Component Value Date/Time   CHOL 172 03/20/2015 0812   TRIG 62 03/20/2015 0812   HDL 44 03/20/2015 0812   CHOLHDL 3.9  03/20/2015 0812   VLDL 12 03/20/2015 0812   LDLCALC 116 03/20/2015 0812    Additional studies/ records that were reviewed today include:   Echo 01/28/15 Inferoseptal HK, inferior HK, apical AK, anteroseptal HK, moderate LVH, EF 123XX123 grade 2 diastolic dysfunction, mild aortic stenosis (mean 9 mmHg), moderate AI, mildly dilated aortic root, ascending aortic diameter 40 mm (mildly dilated), mild MR, mild LAE, mild PI, PASP 36 mmHg  LHC 01/27/15 LM: 40% LAD: Mid 99%, 99% >> Synergy DES x 2 (overlapping) LCx: 100% RCA: 100% SVG-LAD: 100% SVG-OM2 90% >> Synergy DES SVG-RCA 100% EF 25-35%, 4+ MR 1. Severe three-vessel coronary artery disease with chronically occluded left circumflex and right coronary artery. Severe subtotal occlusion in the mid LAD. 2. Chronically occluded SVG to RCA (with left-to-right collaterals) and SVG to LAD. Severe 90% stenosis in the SVG to OM. 3. Severely reduced LV systolic function with segmental wall motion abnormalities as outlined above and severe mitral regurgitation. 4. Severely elevated left ventricular end-diastolic pressure. 5. Successful angioplasty and drug-eluting stent placement to the LAD 2 in an overlapped fashion and 1 drug-eluting stent placement to SVG to OM2.  Abd Aorta US 09/19/13 No AAA  Carotid US 09/19/13 Bilateral ICA 1-39% FU  2 years (due in 09/2015)    ASSESSMENT:    1. Chest pain, unspecified chest pain type   2. Coronary artery disease involving native coronary artery of native heart without angina pectoris   3. Ischemic cardiomyopathy   4. Chronic combined systolic and diastolic CHF (congestive heart failure) ([place])   5. Aortic insufficiency   6. Mitral regurgitation   7. Epistaxis   8. Hematuria   9. Essential hypertension   10. Hyperlipidemia   11. Carotid stenosis, bilateral     PLAN:    In order of problems listed above:  1. Chest Pain - Patient had an episode of chest pain yesterday that was relieved with NTG.  He has had an occasional chest pain since his MI.  It does not seem to be any worse than prior. This episode yesterday may have lasted 30 minutes.  ECG is unchanged.  He has residual occluded RCA and occluded S-RCA at his cath in 9/16.  I will advance his anti-anginal Rx by adding Imdur 30 mg QD.  Echo is pending.  Will go ahead and get this now.  Check stat Troponin today.  If abnormal, will send him to the ED for admission.  2. CAD - s/p CABG.  S/p non-STEMI treated with DES 2 to the native LAD and DES to SVG-OM 9/16.  He does have a chronically occluded RCA and occluded SVG-RCA.Add nitrates as noted.  Continue beta blocker, ARB, aspirin, Brilinta. With his nosebleeds and ?hematuria, I will review with Dr. [name] to see if we can change him to Plavix (cost is also an issue).    3. Ischemic Cardiomyopathy: EF 25-30% by echocardiogram in 9/16.  Repeat Echo was planned in Nov 2016 but not done.  As noted, will go ahead and get echo scheduled.  If EF < 35%, refer to EP for ICD. Continue beta blocker, ARB.Add Isosorbide as noted.   4. Chronic Combined Systolic and Diastolic CHF: Volume appears stable.  Check BMET today.  5. Aortic Insufficiency: Moderate by recent echocardiogram. This will need to be followed clinically as well as with serial echocardiograms over time.  6. Mitral  Regurgitation: Mild by echocardiogram.  7. Epistaxis - We discussed referral to ENT.  However, he is concerned  about cost.  I will have him FU with his PCP for further management for now.  8. Hematuria - Questionable hx.  Also has decreased urine flow.  I will have him FU with PCP for this as well.  Check CBC.    9. HTN: Controlled.   10. Hyperlipidemia: Not on statin therapy secondary to elevated LFTs and CPK in the past. Referral to lipid clinic for consideration of PCSK-9 therapy has been previously discussed. He preferred to remain on Zetia.  LDL in 11/16 was 116.  He is not taking Zetia.    -  Start Zetia 10 mg QD  -  Check Lipids and LFTs 2 mos.   11. Carotid stenosis:  FU carotid US is due in 5/17.     Medication Adjustments/Labs and Tests Ordered: Current medicines are reviewed at length with the patient today.  Concerns regarding medicines are outlined above.  Medication changes, Labs and Tests ordered today are outlined in the Patient Instructions noted below. Patient Instructions  Medication Instructions:  1. START IMDUR 30 MG DAILY; RX SENT 2. START ZETIA 10 MG DAILY; RX SENT  Labwork: 1. TODAY BMET, CBC W/DIFF, STAT TROPONIN   2. FASTING LIPID AND LIVER PANEL TO BE DONE IN 2 MONTHS  Testing/Procedures: Your physician has requested that you have an echocardiogram. Echocardiography is a painless test that uses sound waves to create images of your heart. It provides your doctor with information about the size and shape of your heart and how well your heart's chambers and valves are working. This procedure takes approximately one hour. There are no restrictions for this procedure. ORDER IN SYSTEM ALREADY   Follow-Up: Tehillah Cipriani, PAC 2-3 WEEKS  Any Other Special Instructions Will Be Listed Below (If Applicable). YOU WILL NEED TO FOLLOW UP WITH PRIMARY CARE ON 06/06/15 @ 8:30 WITH DOROTHY FERGUSON, PAC TO DISCUSS ABOUT NOSE BLEEDS   If you need a refill on your  cardiac medications before your next appointment, please call your pharmacy.    Signed, [name], PA-C  05/30/2015 1:22 PM    [facility] [address], [place], [place]  [address] Phone: [phone]; Fax: [phone]

## 2015-05-30 ENCOUNTER — Ambulatory Visit (INDEPENDENT_AMBULATORY_CARE_PROVIDER_SITE_OTHER): Payer: Medicare Other | Admitting: Physician Assistant

## 2015-05-30 ENCOUNTER — Encounter (HOSPITAL_COMMUNITY): Payer: Self-pay | Admitting: *Deleted

## 2015-05-30 ENCOUNTER — Encounter: Payer: Self-pay | Admitting: Physician Assistant

## 2015-05-30 ENCOUNTER — Emergency Department (HOSPITAL_COMMUNITY)
Admission: EM | Admit: 2015-05-30 | Discharge: 2015-05-30 | Disposition: A | Discharge status: Home / Self Care | Payer: Medicare Other | Attending: Emergency Medicine | Admitting: Emergency Medicine

## 2015-05-30 ENCOUNTER — Telehealth: Payer: Self-pay | Admitting: *Deleted

## 2015-05-30 ENCOUNTER — Emergency Department (HOSPITAL_COMMUNITY): Payer: Medicare Other

## 2015-05-30 ENCOUNTER — Encounter: Payer: Self-pay | Admitting: *Deleted

## 2015-05-30 VITALS — BP 130/42 | HR 58 | Ht 65.0 in | Wt 162.2 lb

## 2015-05-30 DIAGNOSIS — R7989 Other specified abnormal findings of blood chemistry: Secondary | ICD-10-CM | POA: Diagnosis present

## 2015-05-30 DIAGNOSIS — Z7982 Long term (current) use of aspirin: Secondary | ICD-10-CM | POA: Diagnosis not present

## 2015-05-30 DIAGNOSIS — E785 Hyperlipidemia, unspecified: Secondary | ICD-10-CM

## 2015-05-30 DIAGNOSIS — I25119 Atherosclerotic heart disease of native coronary artery with unspecified angina pectoris: Secondary | ICD-10-CM | POA: Insufficient documentation

## 2015-05-30 DIAGNOSIS — I209 Angina pectoris, unspecified: Secondary | ICD-10-CM

## 2015-05-30 DIAGNOSIS — F0390 Unspecified dementia without behavioral disturbance: Secondary | ICD-10-CM | POA: Diagnosis not present

## 2015-05-30 DIAGNOSIS — R040 Epistaxis: Secondary | ICD-10-CM | POA: Insufficient documentation

## 2015-05-30 DIAGNOSIS — Z87891 Personal history of nicotine dependence: Secondary | ICD-10-CM | POA: Diagnosis not present

## 2015-05-30 DIAGNOSIS — I2510 Atherosclerotic heart disease of native coronary artery without angina pectoris: Secondary | ICD-10-CM

## 2015-05-30 DIAGNOSIS — Z91040 Latex allergy status: Secondary | ICD-10-CM | POA: Diagnosis not present

## 2015-05-30 DIAGNOSIS — I10 Essential (primary) hypertension: Secondary | ICD-10-CM | POA: Insufficient documentation

## 2015-05-30 DIAGNOSIS — I5042 Chronic combined systolic (congestive) and diastolic (congestive) heart failure: Secondary | ICD-10-CM

## 2015-05-30 DIAGNOSIS — Z8546 Personal history of malignant neoplasm of prostate: Secondary | ICD-10-CM | POA: Diagnosis not present

## 2015-05-30 DIAGNOSIS — R079 Chest pain, unspecified: Secondary | ICD-10-CM | POA: Diagnosis not present

## 2015-05-30 DIAGNOSIS — Z8711 Personal history of peptic ulcer disease: Secondary | ICD-10-CM | POA: Insufficient documentation

## 2015-05-30 DIAGNOSIS — Z9889 Other specified postprocedural states: Secondary | ICD-10-CM | POA: Insufficient documentation

## 2015-05-30 DIAGNOSIS — I351 Nonrheumatic aortic (valve) insufficiency: Secondary | ICD-10-CM

## 2015-05-30 DIAGNOSIS — R319 Hematuria, unspecified: Secondary | ICD-10-CM

## 2015-05-30 DIAGNOSIS — I6523 Occlusion and stenosis of bilateral carotid arteries: Secondary | ICD-10-CM

## 2015-05-30 DIAGNOSIS — Z8719 Personal history of other diseases of the digestive system: Secondary | ICD-10-CM | POA: Insufficient documentation

## 2015-05-30 DIAGNOSIS — I255 Ischemic cardiomyopathy: Secondary | ICD-10-CM | POA: Diagnosis not present

## 2015-05-30 DIAGNOSIS — I340 Nonrheumatic mitral (valve) insufficiency: Secondary | ICD-10-CM

## 2015-05-30 DIAGNOSIS — Z7902 Long term (current) use of antithrombotics/antiplatelets: Secondary | ICD-10-CM | POA: Insufficient documentation

## 2015-05-30 DIAGNOSIS — R600 Localized edema: Secondary | ICD-10-CM | POA: Insufficient documentation

## 2015-05-30 DIAGNOSIS — Z79899 Other long term (current) drug therapy: Secondary | ICD-10-CM | POA: Diagnosis not present

## 2015-05-30 LAB — BASIC METABOLIC PANEL
Anion gap: 10 (ref 5–15)
BUN: 30 mg/dL — AB (ref 6–20)
CHLORIDE: 103 mmol/L (ref 101–111)
CO2: 26 mmol/L (ref 22–32)
Calcium: 9.4 mg/dL (ref 8.9–10.3)
Creatinine, Ser: 2.18 mg/dL — ABNORMAL HIGH (ref 0.61–1.24)
GFR calc Af Amer: 32 mL/min — ABNORMAL LOW (ref >60)
GFR calc non Af Amer: 27 mL/min — ABNORMAL LOW (ref >60)
GLUCOSE: 117 mg/dL — AB (ref 65–99)
POTASSIUM: 4.2 mmol/L (ref 3.5–5.1)
Sodium: 139 mmol/L (ref 135–145)

## 2015-05-30 LAB — I-STAT TROPONIN, ED
TROPONIN I, POC: 0.04 ng/mL (ref 0.00–0.08)
Troponin i, poc: 0.06 ng/mL (ref 0.00–0.08)

## 2015-05-30 LAB — CBC
HEMATOCRIT: 34.7 % — AB (ref 39.0–52.0)
HEMOGLOBIN: 11.4 g/dL — AB (ref 13.0–17.0)
MCH: 28.1 pg (ref 26.0–34.0)
MCHC: 32.9 g/dL (ref 30.0–36.0)
MCV: 85.7 fL (ref 78.0–100.0)
Platelets: 204 10*3/uL (ref 150–400)
RBC: 4.05 MIL/uL — ABNORMAL LOW (ref 4.22–5.81)
RDW: 14.2 % (ref 11.5–15.5)
WBC: 5 10*3/uL (ref 4.0–10.5)

## 2015-05-30 LAB — TROPONIN I
TROPONIN I: 0.05 ng/mL — AB (ref <0.031)
Troponin I: 0.05 ng/mL — ABNORMAL HIGH (ref <0.031)

## 2015-05-30 LAB — PROTIME-INR
INR: 1.25 (ref 0.00–1.49)
Prothrombin Time: 15.8 seconds — ABNORMAL HIGH (ref 11.6–15.2)

## 2015-05-30 MED ORDER — EZETIMIBE 10 MG PO TABS: 10.0000 mg | ORAL_TABLET | Freq: Every day | ORAL | Status: DC

## 2015-05-30 MED ORDER — ASPIRIN 81 MG PO CHEW
243.0000 mg | CHEWABLE_TABLET | Freq: Once | ORAL | Status: AC
  Administered 2015-05-30: 243 mg via ORAL
  Filled 2015-05-30: qty 3

## 2015-05-30 MED ORDER — ISOSORBIDE MONONITRATE ER 30 MG PO TB24: 30.0000 mg | ORAL_TABLET | Freq: Every day | ORAL | Status: DC

## 2015-05-30 NOTE — ED Notes (Signed)
Pt reports episode of chest pain yesterday and was having nosebleed today, went to pcp and had blood work done. Was called today to come here for eval due to elevated troponin. Denies any cp or sob at triage, ekg done.

## 2015-05-30 NOTE — Consult Note (Signed)
Referring Physician: Dr. Winfred Leeds PCP: Shirline Frees, MD Cardiologist: Dr. [name] Reason for Consultation: chest pain yesterday, elevated troponin   HPI: 81 yo man with known CAD s/p 3v CABG (SVG-LAD, SVG-OM and SVG-RCA) who had an episode of CP yesterday and was seen today in office and advised to come in after obtaining a troponin that was noted to be mildly elevated.  He reports being at [place] at [facility] yesterday when he developed substernal chest pressure.  He told his wife they were going to have to go home and did so.  Upon arrival, he took NTG with immediate relief.  No other symptoms.  He reports he has had a few episodes after his MI in Sept 2016 that quickly resolve with 1 NTG.  Of note, in Sept 2016 he had an MI with toponin > 65, cath at time showed CTO of RCA with occluded graft, CTO of Cx with obstructive lesion in graft (treated with PCI) and obstructive LAD lesions treated with PCI as graft was occluded.  He reports being compliant with medications.  Wife is at bedside and states they have an appointment with [name] PA tomorrow for a test (echo per future appointments).  Since episode yesterday, he has been back to baseline level of activity without recurrence of CP.  Wife mentioned to Dr. Winfred Leeds that she preferred not staying if possible given the medical bills they have accrued.    Review of Systems:     Cardiac Review of Systems: {Y] = yes [ ]  = no  Chest Pain [ x  ]  Resting SOB [   ] Exertional SOB  [  ]  Orthopnea [  ]   Pedal Edema [   ]    Palpitations [  ] Syncope  [  ]   Presyncope [   ]  General Review of Systems: [Y] = yes [  ]=no Constitional: recent weight change [  ]; anorexia [  ]; fatigue [  ]; nausea [  ]; night sweats [  ]; fever [  ]; or chills [  ];                                                                      Eyes : blurred vision [  ]; diplopia [   ]; vision changes [  ];  Amaurosis fugax[  ]; Resp: cough  [  ];  wheezing[  ];  hemoptysis[  ];  PND [  ];  GI:  gallstones[  ], vomiting[  ];  dysphagia[  ]; melena[  ];  hematochezia [  ]; heartburn[  ];   GU: kidney stones [  ]; hematuria[  ];   dysuria [  ];  nocturia[  ]; incontinence [  ];             Skin: rash, swelling[  ];, hair loss[  ];  peripheral edema[  ];  or itching[  ]; Musculosketetal: myalgias[  ];  joint swelling[  ];  joint erythema[  ];  joint pain[  ];  back pain[  ];  Heme/Lymph: bruising[  ];  bleeding[  ];  anemia[  ];  Neuro: TIA[  ];  headaches[  ];  stroke[  ];  vertigo[  ];  seizures[  ];   paresthesias[  ];  difficulty walking[  ];  Psych:depression[  ]; anxiety[  ];  Endocrine: diabetes[  ];  thyroid dysfunction[  ];  Other:  Past Medical History  Diagnosis Date  . Hypertension   . Prostate CA ([place])     a. s/p seed implants  . Hyperlipidemia   . Peptic ulcer disease   . Internal hemorrhoids   . Carotid stenosis     a. 09/2013: carotid dopplers with stable 1-39% ICA stenosis bilaterally  . CAD (coronary artery disease)     a. CABG (2003)  b. 9/17/6: NSTEMI s/p overlapping DES x2 to LAD and DESx1 to SVG-->OM   . Dementia   . Ischemic cardiomyopathy     a. 2D ECHO 01/28/15 w/ EF 25-30%, severe HK of the basal-midinferoseptal myocardium, basal-midinferior myocardium and midanteroseptal myocardium. Akinesis of apical inferior and apicalanterior myocardium. G2DD. Mild AS, Asc aorta diameter 33mm. Mild dilation of aortic root. Mild MR, mild PR and PA peak pressure 36 mmg HG.     (Not in a hospital admission)     Infusions:   Allergies  Allergen Reactions  . Ramipril Other (See Comments)    angioedema  . Latex Itching and Rash    Social History   Social History  . Marital Status: Married    Spouse Name: N/A  . Number of Children: N/A  . Years of Education: N/A   Occupational History  . Retired    Social History Main Topics  . Smoking status: Former Research scientist (life sciences)  . Smokeless tobacco: Not on file  .  Alcohol Use: No  . Drug Use: Not on file  . Sexual Activity: Not on file   Other Topics Concern  . Not on file   Social History Narrative    Family History  Problem Relation Age of Onset  . Stroke Father     PHYSICAL EXAM: Filed Vitals:   05/30/15 2045 05/30/15 2115  BP: 135/57 127/55  Pulse: 56 55  Temp:    Resp: 18 17    No intake or output data in the 24 hours ending 05/30/15 2146  General:  Elderly man, frail appearing. No respiratory difficulty HEENT: PERRL, EOMI, anicteric, MMM Neck: supple. no JVD. Carotids 2+ bilat; no bruits. No lymphadenopathy or thryomegaly appreciated. Cor: PMI nondisplaced. Regular rate & rhythm. No rubs, gallops, or murmur. Lungs: CTA Abdomen: soft, nontender, nondistended. No hepatosplenomegaly. No bruits or masses. Good bowel sounds. Extremities: no cyanosis, clubbing, rash, edema Neuro: alert & oriented x 3, cranial nerves grossly intact. moves all 4 extremities w/o difficulty. Affect pleasant.  ECG: SR, LAFB, anterior infarct and lateral T wave abnormality  Results for orders placed or performed during the hospital encounter of 05/30/15 (from the past 24 hour(s))  Basic metabolic panel     Status: Abnormal   Collection Time: 05/30/15  3:30 PM  Result Value Ref Range   Sodium 139 135 - 145 mmol/L   Potassium 4.2 3.5 - 5.1 mmol/L   Chloride 103 101 - 111 mmol/L   CO2 26 22 - 32 mmol/L   Glucose, Bld 117 (H) 65 - 99 mg/dL   BUN 30 (H) 6 - 20 mg/dL   Creatinine, Ser 2.18 (H) 0.61 - 1.24 mg/dL   Calcium 9.4 8.9 - 10.3 mg/dL   GFR calc non Af Amer 27 (L) >60 mL/min   GFR calc Af Amer 32 (L) >60 mL/min   Anion gap 10 5 -  15  CBC     Status: Abnormal   Collection Time: 05/30/15  3:30 PM  Result Value Ref Range   WBC 5.0 4.0 - 10.5 K/uL   RBC 4.05 (L) 4.22 - 5.81 MIL/uL   Hemoglobin 11.4 (L) 13.0 - 17.0 g/dL   HCT 34.7 (L) 39.0 - 52.0 %   MCV 85.7 78.0 - 100.0 fL   MCH 28.1 26.0 - 34.0 pg   MCHC 32.9 30.0 - 36.0 g/dL   RDW 14.2  11.5 - 15.5 %   Platelets 204 150 - 400 K/uL  Protime-INR - (order if Patient is taking Coumadin / Warfarin)     Status: Abnormal   Collection Time: 05/30/15  3:30 PM  Result Value Ref Range   Prothrombin Time 15.8 (H) 11.6 - 15.2 seconds   INR 1.25 0.00 - 1.49  I-stat troponin, ED (not at [facility], [facility])     Status: None   Collection Time: 05/30/15  4:02 PM  Result Value Ref Range   Troponin i, poc 0.06 0.00 - 0.08 ng/mL   Comment 3          Troponin I     Status: Abnormal   Collection Time: 05/30/15  5:53 PM  Result Value Ref Range   Troponin I 0.05 (H) <0.031 ng/mL  I-Stat Troponin, ED (not at [facility])     Status: None   Collection Time: 05/30/15  7:28 PM  Result Value Ref Range   Troponin i, poc 0.04 0.00 - 0.08 ng/mL   Comment 3           Dg Chest 2 View  05/30/2015  CLINICAL DATA:  Chest pain with nosebleed earlier today. Elevated troponin levels. EXAM: CHEST  2 VIEW COMPARISON:  01/27/2015. FINDINGS: The heart size and mediastinal contours are stable status post CABG. There is aortic tortuosity. There is interval improved aeration of the lung bases with resolution of probable edema present previously. There is no confluent airspace opacity or pleural effusion. Mild degenerative changes are present within the spine. IMPRESSION: No active cardiopulmonary process. Electronically Signed   By: Richardean Sale M.D.   On: 05/30/2015 15:58     ASSESSMENT: 81 yo man with known CAD (described above) with episode of chest pressure yesterday quickly relieved with NTG c/w angina.  He has not had any recurrence of pain/pressure since episode yesterday.  His troponin obtained in office earlier today was 0.05 followed by another 0.05 in ED (as well as a 0.06 and 0.04 i-STAT troponin).  Given his defined anatomy, brief duration of angina, quick resolution with NTG, no recurrence, stable ECG, and stable/minimally elevated troponin I suspect this is more likely related to angina in setting of chronic coronary  artery disease rather than ACS.  We had a discussion about current data available and options including observation vs discharge with plans on keeping appointment for tomorrow with close f/u with [name] and/or Dr. Percival Spanish and opted for the latter.  He will continue his current medical regimen and should carry his SL NTG tablets with him at all times.  If symptoms recur and do not resolve quickly with NTG or are more severe he is to seem immediate medical attention.

## 2015-05-30 NOTE — Patient Instructions (Signed)
Medication Instructions:  1. START IMDUR 30 MG DAILY; RX SENT 2. START ZETIA 10 MG DAILY; RX SENT  Labwork: 1. TODAY BMET, CBC W/DIFF, STAT TROPONIN   2. FASTING LIPID AND LIVER PANEL TO BE DONE IN 2 MONTHS  Testing/Procedures: Your physician has requested that you have an echocardiogram. Echocardiography is a painless test that uses sound waves to create images of your heart. It provides your doctor with information about the size and shape of your heart and how well your heart's chambers and valves are working. This procedure takes approximately one hour. There are no restrictions for this procedure. ORDER IN SYSTEM ALREADY   Follow-Up: SCOTT WEAVER, PAC 2-3 WEEKS  Any Other Special Instructions Will Be Listed Below (If Applicable). YOU WILL NEED TO FOLLOW UP WITH PRIMARY CARE ON 06/06/15 @ 8:30 WITH DOROTHY FERGUSON, PAC TO DISCUSS ABOUT NOSE BLEEDS   If you need a refill on your cardiac medications before your next appointment, please call your pharmacy.

## 2015-05-30 NOTE — ED Notes (Signed)
Cardiology @ bedside.

## 2015-05-30 NOTE — Discharge Instructions (Signed)
Angina Pectoris Keep your scheduled appointment at the cardiologist's office tomorrow morning Angina pectoris is a very bad feeling in the chest, neck, or arm. Your doctor may call it angina. There are four types of angina. Angina is caused by a lack of blood in the middle and thickest layer of the heart wall (myocardium). Angina may feel like a crushing or squeezing pain in the chest. It may feel like tightness or heavy pressure in the chest. Some people say it feels like gas, heartburn, or indigestion. Some people have symptoms other than pain. These include:  Shortness of breath.  Cold sweats.  Feeling sick to your stomach (nausea).  Feeling light-headed. Many women have chest discomfort and some of the other symptoms. However, women often have different symptoms, such as:  Feeling tired (fatigue).  Feeling nervous for no reason.  Feeling weak for no reason.  Dizziness or fainting. Women may have angina without any symptoms. HOME CARE  Take medicines only as told by your doctor.  Take care of other health issues as told by your doctor. These include:  High blood pressure (hypertension).  Diabetes.  Follow a heart-healthy diet. Your doctor can help you to choose healthy food options and make changes.  Talk to your doctor to learn more about healthy cooking methods and use them. These include:  Roasting.  Grilling.  Broiling.  Baking.  Poaching.  Steaming.  Stir-frying.  Follow an exercise program approved by your doctor.  Keep a healthy weight. Lose weight as told by your doctor.  Rest when you are tired.  Learn to manage stress.  Do not use any tobacco, such as cigarettes, chewing tobacco, or electronic cigarettes. If you need help quitting, ask your doctor.  If you drink alcohol, and your doctor says it is okay, limit yourself to no more than 1 drink per day. One drink equals 12 ounces of beer, 5 ounces of wine, or 1 ounces of hard liquor.  Stop  illegal drug use.  Keep all follow-up visits as told by your doctor. This is important. Do not take these medicines unless your doctor says that you can:  Nonsteroidal anti-inflammatory drugs (NSAIDs). These include:  Ibuprofen.  Naproxen.  Celecoxib.  Vitamin supplements that have vitamin A, vitamin E, or both.  Hormone therapy that contains estrogen with or without progestin. GET HELP RIGHT AWAY IF:  You have pain in your chest, neck, arm, jaw, stomach, or back that:  Lasts more than a few minutes.  Comes back.  Does not get better after you take medicine under your tongue (sublingual nitroglycerin).  You have any of these symptoms for no reason:  Gas, heartburn, or indigestion.  Sweating a lot.  Shortness of breath or trouble breathing.  Feeling sick to your stomach or throwing up.  Feeling more tired than usual.  Feeling nervous or worrying more than usual.  Feeling weak.  Diarrhea.  You are suddenly dizzy or light-headed.  You faint or pass out. These symptoms may be an emergency. Do not wait to see if the symptoms will go away. Get medical help right away. Call your local emergency services (911 in the [place] Do not drive yourself to the hospital.   This information is not intended to replace advice given to you by your health care provider. Make sure you discuss any questions you have with your health care provider.   Document Released: 10/15/2007 Document Revised: 09/12/2014 Document Reviewed: 08/30/2013 Elsevier Interactive Patient Education [employer].

## 2015-05-30 NOTE — Telephone Encounter (Signed)
S/w pt's wife DPR on file per Brynda Rim. PA lab work for STAT TROPONIN came back elevated. Advised wife per Brynda Rim. PA to take pt to [facility] ED for further evaluation by our team. Trish aware. Wife verbalized understanding to plan of care, wife was crying. I reassured pt's wife that we will take good care of her husband. I asked her if she wanted me to call their daughter, wife said yes. I called daughter and our call was disconnected and I have not been able to reach again, no VM on daughter's phone. Daughter cb and said her phone had died. I explained to the daughter that we saw her dad today in the office with some lab work done today. I explained that this lab work came back elevated (Troponin) and when this comes back elevated we advise sending pt to ED for further evaluation. The daughter very anxious on the phone with me and was asking how how was this lab work. I gave her the results of 0.05 on the Troponin. She kept asking me is this off the charts high or just a little high. I tried to explain to her that should be a question to ask the dr. She become very loud with me and said never mind that her mother was calling and she hung up the phone on me.

## 2015-05-30 NOTE — ED Notes (Signed)
Pt undressed and ready for provider exam

## 2015-05-30 NOTE — ED Provider Notes (Signed)
CSN: [id number]     Arrival date & time 05/30/15  1520 History   First MD Initiated Contact with Patient 05/30/15 2004     Chief Complaint  Patient presents with  . Abnormal Lab     (Consider location/radiation/quality/duration/timing/severity/associated sxs/prior Treatment) HPI Patient had episode of chest pain anterior yesterday 11:30 AM lasting 15-30 minutes. The pain resolved after one sublingual nitroglycerin. He had a minimal nosebleed today. He got in to see cardiologist office today [facility] was noted to have a mildly elevated troponin at 0.05, was sent here for further evaluation. He reports no further episodes of chest pain no shortness of breath no nausea or sweatiness. He is presently asymptomatic. Past Medical History  Diagnosis Date  . Hypertension   . Prostate CA ([place])     a. s/p seed implants  . Hyperlipidemia   . Peptic ulcer disease   . Internal hemorrhoids   . Carotid stenosis     a. 09/2013: carotid dopplers with stable 1-39% ICA stenosis bilaterally  . CAD (coronary artery disease)     a. CABG (2003)  b. 9/17/6: NSTEMI s/p overlapping DES x2 to LAD and DESx1 to SVG-->OM   . Dementia   . Ischemic cardiomyopathy     a. 2D ECHO 01/28/15 w/ EF 25-30%, severe HK of the basal-midinferoseptal myocardium, basal-midinferior myocardium and midanteroseptal myocardium. Akinesis of apical inferior and apicalanterior myocardium. G2DD. Mild AS, Asc aorta diameter 57mm. Mild dilation of aortic root. Mild MR, mild PR and PA peak pressure 36 mmg HG.   Past Surgical History  Procedure Laterality Date  . Cardiac surgery  2003    SVG LAD, SVG to right coronary artery, SVG to marginal  . Cardiac catheterization N/A 01/27/2015    Procedure: Left Heart Cath and Coronary Angiography;  Surgeon: Wellington Hampshire, MD;  Location: [place] [facility];  Service: Cardiovascular;  Laterality: N/A;   Family History  Problem Relation Age of Onset  . Stroke Father    Social  History  Substance Use Topics  . Smoking status: Former Research scientist (life sciences)  . Smokeless tobacco: None  . Alcohol Use: No    Review of Systems  Constitutional: Negative.   HENT: Positive for nosebleeds.        Brief self-limiting nosebleed earlier today  Respiratory: Negative.   Cardiovascular: Positive for chest pain and leg swelling.       Chronic leg edema  Gastrointestinal: Negative.   Musculoskeletal: Negative.   Skin: Negative.   Neurological: Negative.   Psychiatric/Behavioral: Negative.   All other systems reviewed and are negative.     Allergies  Ramipril and Latex  Home Medications   Prior to Admission medications   Medication Sig Start Date End Date Taking? Authorizing Provider  aspirin EC 81 MG tablet Take 1 tablet (81 mg total) by mouth daily. 09/08/13  Yes [name], MD  carvedilol (COREG) 6.25 MG tablet Take 6.25 mg by mouth 2 (two) times daily with a meal.  05/15/15  Yes Historical Provider, MD  furosemide (LASIX) 20 MG tablet Take 1 tablet (20 mg total) by mouth daily. 05/29/15  Yes [name], MD  losartan (COZAAR) 50 MG tablet Take 1 tablet (50 mg total) by mouth daily. 05/29/15  Yes [name], MD  nitroGLYCERIN (NITROSTAT) 0.4 MG SL tablet Place 1 tablet (0.4 mg total) under the tongue every 5 (five) minutes x 3 doses as needed for chest pain. 01/30/15  Yes Eileen Stanford, PA-C  ticagrelor [facility]) 90  MG TABS tablet Take 1 tablet (90 mg total) by mouth 2 (two) times daily. 01/30/15  Yes Eileen Stanford, PA-C  ezetimibe (ZETIA) 10 MG tablet Take 1 tablet (10 mg total) by mouth daily. 05/30/15   Liliane Shi, PA-C  isosorbide mononitrate (IMDUR) 30 MG 24 hr tablet Take 1 tablet (30 mg total) by mouth daily. 05/30/15   Scott T Weaver, PA-C   BP 116/62 mmHg  Pulse 25  Temp(Src) 97.7 F (36.5 C) (Oral)  Resp 15  SpO2 97% Physical Exam  Constitutional: He appears well-developed and well-nourished.  HENT:  Head: Normocephalic and atraumatic.  Eyes:  Conjunctivae are normal. Pupils are equal, round, and reactive to light.  Neck: Neck supple. No tracheal deviation present. No thyromegaly present.  Cardiovascular: Normal rate and regular rhythm.   No murmur heard. Pulmonary/Chest: Effort normal and breath sounds normal.  Abdominal: Soft. Bowel sounds are normal. He exhibits no distension. There is no tenderness.  Musculoskeletal: Normal range of motion. He exhibits edema. He exhibits no tenderness.  2+ pretibial pitting edema bilaterally  Neurological: He is alert. Coordination normal.  Skin: Skin is warm and dry. No rash noted.  Psychiatric: He has a normal mood and affect.  Nursing note and vitals reviewed.   ED Course  Procedures (including critical care time) Labs Review Labs Reviewed  BASIC METABOLIC PANEL - Abnormal; Notable for the following:    Glucose, Bld 117 (*)    BUN 30 (*)    Creatinine, Ser 2.18 (*)    GFR calc non Af Amer 27 (*)    GFR calc Af Amer 32 (*)    All other components within normal limits  CBC - Abnormal; Notable for the following:    RBC 4.05 (*)    Hemoglobin 11.4 (*)    HCT 34.7 (*)    All other components within normal limits  PROTIME-INR - Abnormal; Notable for the following:    Prothrombin Time 15.8 (*)    All other components within normal limits  TROPONIN I - Abnormal; Notable for the following:    Troponin I 0.05 (*)    All other components within normal limits  I-STAT TROPOININ, ED  Randolm Idol, ED    Imaging Review Dg Chest 2 View  05/30/2015  CLINICAL DATA:  Chest pain with nosebleed earlier today. Elevated troponin levels. EXAM: CHEST  2 VIEW COMPARISON:  01/27/2015. FINDINGS: The heart size and mediastinal contours are stable status post CABG. There is aortic tortuosity. There is interval improved aeration of the lung bases with resolution of probable edema present previously. There is no confluent airspace opacity or pleural effusion. Mild degenerative changes are present  within the spine. IMPRESSION: No active cardiopulmonary process. Electronically Signed   By: Richardean Sale M.D.   On: 05/30/2015 15:58   I have personally reviewed and evaluated these images and lab results as part of my medical decision-making.   EKG Interpretation   Date/Time:  Wednesday May 30 2015 15:22:09 EST Ventricular Rate:  66 PR Interval:  216 QRS Duration: 104 QT Interval:  388 QTC Calculation: 406 R Axis:   -47 Text Interpretation:  Sinus rhythm with 1st degree A-V block Left anterior  fascicular block Anteroseptal infarct , age undetermined Abnormal ECG  Confirmed by Winfred Leeds  MD, Donzel Romack [phone]) on 05/30/2015 8:40:46 PM     Chest x-ray viewed by me  Results for orders placed or performed during the hospital encounter of [id number]  Basic metabolic panel  Result Value  Ref Range   Sodium 139 135 - 145 mmol/L   Potassium 4.2 3.5 - 5.1 mmol/L   Chloride 103 101 - 111 mmol/L   CO2 26 22 - 32 mmol/L   Glucose, Bld 117 (H) 65 - 99 mg/dL   BUN 30 (H) 6 - 20 mg/dL   Creatinine, Ser 2.18 (H) 0.61 - 1.24 mg/dL   Calcium 9.4 8.9 - 10.3 mg/dL   GFR calc non Af Amer 27 (L) >60 mL/min   GFR calc Af Amer 32 (L) >60 mL/min   Anion gap 10 5 - 15  CBC  Result Value Ref Range   WBC 5.0 4.0 - 10.5 K/uL   RBC 4.05 (L) 4.22 - 5.81 MIL/uL   Hemoglobin 11.4 (L) 13.0 - 17.0 g/dL   HCT 34.7 (L) 39.0 - 52.0 %   MCV 85.7 78.0 - 100.0 fL   MCH 28.1 26.0 - 34.0 pg   MCHC 32.9 30.0 - 36.0 g/dL   RDW 14.2 11.5 - 15.5 %   Platelets 204 150 - 400 K/uL  Protime-INR - (order if Patient is taking Coumadin / Warfarin)  Result Value Ref Range   Prothrombin Time 15.8 (H) 11.6 - 15.2 seconds   INR 1.25 0.00 - 1.49  Troponin I  Result Value Ref Range   Troponin I 0.05 (H) <0.031 ng/mL  I-stat troponin, ED (not at [facility], [facility])  Result Value Ref Range   Troponin i, poc 0.06 0.00 - 0.08 ng/mL   Comment 3          I-Stat Troponin, ED (not at [facility])  Result Value Ref Range   Troponin i, poc  0.04 0.00 - 0.08 ng/mL   Comment 3           Dg Chest 2 View  05/30/2015  CLINICAL DATA:  Chest pain with nosebleed earlier today. Elevated troponin levels. EXAM: CHEST  2 VIEW COMPARISON:  01/27/2015. FINDINGS: The heart size and mediastinal contours are stable status post CABG. There is aortic tortuosity. There is interval improved aeration of the lung bases with resolution of probable edema present previously. There is no confluent airspace opacity or pleural effusion. Mild degenerative changes are present within the spine. IMPRESSION: No active cardiopulmonary process. Electronically Signed   By: Richardean Sale M.D.   On: 05/30/2015 15:58  chest xray viewed by me  MDM  Dr. Philbert Riser from cardiology was consulted and evaluated patient in the emergency department he deemed the patient was stable for discharge. He is to follow-up tomorrow at cardiologist's office. Patient had likely episode of angina. However pain brief and self-limiting and relieved with one sublingual nitroglycerin. Renal insufficiency is chronic  Final diagnoses:  None   diagnosis #1 angina pectoris #2 renal insufficiency      Orlie Dakin, MD 05/30/15 2147

## 2015-05-31 ENCOUNTER — Other Ambulatory Visit: Payer: Self-pay

## 2015-05-31 ENCOUNTER — Ambulatory Visit (HOSPITAL_COMMUNITY): Payer: Medicare Other | Attending: Cardiovascular Disease

## 2015-05-31 DIAGNOSIS — I5042 Chronic combined systolic (congestive) and diastolic (congestive) heart failure: Secondary | ICD-10-CM | POA: Insufficient documentation

## 2015-05-31 DIAGNOSIS — I10 Essential (primary) hypertension: Secondary | ICD-10-CM | POA: Insufficient documentation

## 2015-05-31 DIAGNOSIS — I255 Ischemic cardiomyopathy: Secondary | ICD-10-CM | POA: Diagnosis not present

## 2015-05-31 DIAGNOSIS — I2510 Atherosclerotic heart disease of native coronary artery without angina pectoris: Secondary | ICD-10-CM | POA: Insufficient documentation

## 2015-05-31 DIAGNOSIS — R29898 Other symptoms and signs involving the musculoskeletal system: Secondary | ICD-10-CM | POA: Insufficient documentation

## 2015-05-31 DIAGNOSIS — I517 Cardiomegaly: Secondary | ICD-10-CM | POA: Diagnosis not present

## 2015-05-31 DIAGNOSIS — I059 Rheumatic mitral valve disease, unspecified: Secondary | ICD-10-CM | POA: Diagnosis not present

## 2015-05-31 DIAGNOSIS — R079 Chest pain, unspecified: Secondary | ICD-10-CM | POA: Diagnosis not present

## 2015-05-31 DIAGNOSIS — I351 Nonrheumatic aortic (valve) insufficiency: Secondary | ICD-10-CM | POA: Diagnosis not present

## 2015-05-31 DIAGNOSIS — I340 Nonrheumatic mitral (valve) insufficiency: Secondary | ICD-10-CM

## 2015-06-04 ENCOUNTER — Telehealth: Payer: Self-pay | Admitting: Physician Assistant

## 2015-06-04 DIAGNOSIS — I25110 Atherosclerotic heart disease of native coronary artery with unstable angina pectoris: Secondary | ICD-10-CM

## 2015-06-04 DIAGNOSIS — I10 Essential (primary) hypertension: Secondary | ICD-10-CM

## 2015-06-04 MED ORDER — CLOPIDOGREL BISULFATE 75 MG PO TABS: 75.0000 mg | ORAL_TABLET | Freq: Every day | ORAL | Status: DC

## 2015-06-04 NOTE — Telephone Encounter (Signed)
Lmptcb to advise pt per Dr. Percival Spanish and [name], [facility] to have pt come in this week for bmet, cbc as well as stop Brilinta and start Plavix 75 mg daily.

## 2015-06-04 NOTE — Telephone Encounter (Signed)
Lmptcb to advise pt per Dr. Percival Spanish and [name], [facility] to have pt come in this week for bmet, cbc as well as stop Brilinta and start Plavix 75 mg daily.

## 2015-06-04 NOTE — Addendum Note (Signed)
Addended by: Michae Kava on: 06/04/2015 03:02 PM   Modules accepted: Orders, Medications

## 2015-06-04 NOTE — Telephone Encounter (Signed)
When patient was evaluated in the ED on 1/18, his Hgb was a little lower than previous and his Creatinine was a little higher than previous. Please arrange FU BMET, CBC some time this week to keep a close eye on this. Also, tell patient I reviewed with Dr. [name] regarding his Brilinta. It is ok to change him to Plavix. So, DC Brilinta after dose tonight. In AM tomorrow, start Plavix 75 mg QD. [name], PA-C   06/04/2015 2:50 PM

## 2015-06-06 ENCOUNTER — Telehealth: Payer: Self-pay | Admitting: Physician Assistant

## 2015-06-06 NOTE — Telephone Encounter (Signed)
New message ° ° ° ° ° °Returning a nurses call to get test results °

## 2015-06-06 NOTE — Telephone Encounter (Signed)
Wife notified that [name] only got a PSA and urine dip, so we still pt to come in this week for BMET, CBC. Wife said pt can come in 1/26.

## 2015-06-06 NOTE — Telephone Encounter (Signed)
DPR on file for wife. [name] has been advised per Dr. Percival Spanish and Brynda Rim. PA to d/c Brilinta and start Plavix 75 daily. Wife stated pt had lab work done today with [name], but she could not remember all the lab work so I will call and verify labs. I stated I will cb alter and let her know if we still need lab work here in our office as well. BMET, CBC. Wife said thank you.

## 2015-06-06 NOTE — Telephone Encounter (Signed)
Lmptcb x 2 to advised pt per D.r Percival Spanish and Brynda Rim. PA to stop Brilinta and start on Plavix 75 mg daily also needing lab work this week per PA.

## 2015-06-07 ENCOUNTER — Telehealth: Payer: Self-pay | Admitting: *Deleted

## 2015-06-07 ENCOUNTER — Other Ambulatory Visit (INDEPENDENT_AMBULATORY_CARE_PROVIDER_SITE_OTHER): Payer: Medicare Other | Admitting: *Deleted

## 2015-06-07 DIAGNOSIS — I10 Essential (primary) hypertension: Secondary | ICD-10-CM

## 2015-06-07 DIAGNOSIS — I25110 Atherosclerotic heart disease of native coronary artery with unstable angina pectoris: Secondary | ICD-10-CM | POA: Diagnosis not present

## 2015-06-07 LAB — CBC WITH DIFFERENTIAL/PLATELET
Basophils Absolute: 0.1 10*3/uL (ref 0.0–0.1)
Basophils Relative: 1 % (ref 0–1)
EOS ABS: 0.8 10*3/uL — AB (ref 0.0–0.7)
Eosinophils Relative: 16 % — ABNORMAL HIGH (ref 0–5)
HCT: 34.9 % — ABNORMAL LOW (ref 39.0–52.0)
Hemoglobin: 11.2 g/dL — ABNORMAL LOW (ref 13.0–17.0)
LYMPHS ABS: 1.9 10*3/uL (ref 0.7–4.0)
Lymphocytes Relative: 37 % (ref 12–46)
MCH: 28.1 pg (ref 26.0–34.0)
MCHC: 32.1 g/dL (ref 30.0–36.0)
MCV: 87.7 fL (ref 78.0–100.0)
MONOS PCT: 10 % (ref 3–12)
MPV: 11.2 fL (ref 8.6–12.4)
Monocytes Absolute: 0.5 10*3/uL (ref 0.1–1.0)
NEUTROS PCT: 36 % — AB (ref 43–77)
Neutro Abs: 1.8 10*3/uL (ref 1.7–7.7)
PLATELETS: 187 10*3/uL (ref 150–400)
RBC: 3.98 MIL/uL — ABNORMAL LOW (ref 4.22–5.81)
RDW: 15 % (ref 11.5–15.5)
WBC: 5.1 10*3/uL (ref 4.0–10.5)

## 2015-06-07 LAB — BASIC METABOLIC PANEL
BUN: 34 mg/dL — AB (ref 7–25)
CHLORIDE: 102 mmol/L (ref 98–110)
CO2: 22 mmol/L (ref 20–31)
CREATININE: 2.43 mg/dL — AB (ref 0.70–1.18)
Calcium: 9 mg/dL (ref 8.6–10.3)
Glucose, Bld: 121 mg/dL — ABNORMAL HIGH (ref 65–99)
Potassium: 4.3 mmol/L (ref 3.5–5.3)
Sodium: 137 mmol/L (ref 135–146)

## 2015-06-07 NOTE — Telephone Encounter (Signed)
Lmptcb to go over lab results and med changes with pt.

## 2015-06-11 NOTE — Telephone Encounter (Signed)
Lmptcb x 2 to go over results and med changes .

## 2015-06-11 NOTE — Telephone Encounter (Signed)
I tried to reach pt again about lab results and med changes, though no answer.

## 2015-06-15 ENCOUNTER — Telehealth: Payer: Self-pay | Admitting: *Deleted

## 2015-06-15 ENCOUNTER — Other Ambulatory Visit (INDEPENDENT_AMBULATORY_CARE_PROVIDER_SITE_OTHER): Payer: Medicare Other | Admitting: *Deleted

## 2015-06-15 DIAGNOSIS — I10 Essential (primary) hypertension: Secondary | ICD-10-CM

## 2015-06-15 LAB — BASIC METABOLIC PANEL
BUN: 23 mg/dL (ref 7–25)
CALCIUM: 8.8 mg/dL (ref 8.6–10.3)
CO2: 28 mmol/L (ref 20–31)
CREATININE: 1.93 mg/dL — AB (ref 0.70–1.18)
Chloride: 103 mmol/L (ref 98–110)
GLUCOSE: 98 mg/dL (ref 65–99)
Potassium: 4.6 mmol/L (ref 3.5–5.3)
SODIUM: 138 mmol/L (ref 135–146)

## 2015-06-15 NOTE — Addendum Note (Signed)
Addended by: Eulis Foster on: 06/15/2015 10:31 AM   Modules accepted: Orders

## 2015-06-15 NOTE — Telephone Encounter (Signed)
DPR on file for wife. I reached pt's wife today and informed her of lab results and findings. I did state since we had not been able to reach them about the lab results which had medication changes, that PA wants to get lab today before we do make changes at this time. I asked wife could pt please come in this morning before 12 noon to get lab work. Wife did state that pt had a slight nose bleed this morning. We had advised at last ov pt needs to follow up with PCP about nose bleeds. Wife is agreeable to plan of care.

## 2015-06-18 ENCOUNTER — Telehealth: Payer: Self-pay | Admitting: *Deleted

## 2015-06-18 DIAGNOSIS — I10 Essential (primary) hypertension: Secondary | ICD-10-CM

## 2015-06-18 MED ORDER — FUROSEMIDE 20 MG PO TABS: 20.0000 mg | ORAL_TABLET | ORAL | Status: DC

## 2015-06-18 NOTE — Telephone Encounter (Signed)
DPR for wife. Wife has been notified of lab results by phone with verbal understanding to plan of care change w/lasix to Mon, Wed and Fri's. Advised to make sure to keep appt 2/8 w/PA . Wife verbalized understanding to instructions w/read back.

## 2015-06-19 NOTE — Progress Notes (Signed)
Cardiology Office Note:    Date:  06/20/2015   ID:  [name], DOB 1934-05-26, MRN [id number]  PCP:  Shirline Frees, MD  Cardiologist:  Dr. [name]   Electrophysiologist:  n/a  Chief Complaint  Patient presents with  . Coronary Artery Disease    Follow up    History of Present Illness:    [name] is a 81 y.o. male with a hx of CAD, s/p CABG in 2003, aortic insufficiency, HTN, HL, carotid stenosis.  He has been nonadherent to follow-up and medications in the past. He has a history of elevated LFTs and total CK on statin therapy in the past.  Admitted 9/16 with a non-STEMI. LHC demonstrated severe 3 vessel CAD with a chronically occluded LCx, RCA and severe subtotal occlusion of the native mid LAD, chronically occluded SVG-RCA and SVG-LAD with severe stenosis and SVG-OM. He underwent PCI with DES 2 in overlapping fashion to the native LAD as well as DES to the SVG-OM. Echocardiogram confirmed reduced LV function with an EF of 123XX123, grade 2 diastolic dysfunction, mild aortic stenosis, moderate AI and mild MR. LVEDP was elevated at the time of his cardiac catheterization (32). He was diuresed with IV Lasix and was down 6 pounds (-4.6 L) by discharge.  I saw him 05/30/15 with complaints of chest pain.  His symptoms were somewhat prolonged.  A stat Troponin was elevated and we sent him to the ED. Troponin levels remained minimally elevated without clear trend.  He was evaluated by the Cardiology Fellow and DC from the ED. FU echo has since been done and his EF has improved to 40-45%.  FU labs demonstrated worsening Creatinine and we decreased his Furosemide.  He has c/o epistaxis and I reviewed his case with Dr.Hochrein. We changed his Brilinta to Plavix.  He returns for FU.    He is doing well.  Denies any further chest pain.  Breathing is stable.  Denies syncope, orthopnea, PND.  LE edema is stable.  Denies significant bleeding since changing to Plavix.    Past Medical  History  Diagnosis Date  . Hypertension   . Prostate CA ([place])     a. s/p seed implants  . Hyperlipidemia   . Peptic ulcer disease   . Internal hemorrhoids   . Carotid stenosis     a. 09/2013: carotid dopplers with stable 1-39% ICA stenosis bilaterally  . CAD (coronary artery disease)     a. CABG (2003)  b. 9/17/6: NSTEMI s/p overlapping DES x2 to LAD and DESx1 to SVG-->OM   . Dementia   . Ischemic cardiomyopathy     a. 2D ECHO 01/28/15 w/ EF 25-30%, severe HK of the basal-midinferoseptal myocardium, basal-midinferior myocardium and midanteroseptal myocardium. Akinesis of apical inferior and apicalanterior myocardium. G2DD. Mild AS, Asc aorta diameter 62mm. Mild dilation of aortic root. Mild MR, mild PR and PA peak pressure 36 mmg HG.    Past Surgical History  Procedure Laterality Date  . Cardiac surgery  2003    SVG LAD, SVG to right coronary artery, SVG to marginal  . Cardiac catheterization N/A 01/27/2015    Procedure: Left Heart Cath and Coronary Angiography;  Surgeon: Wellington Hampshire, MD;  Location: [facility];  Service: Cardiovascular;  Laterality: N/A;    Current Medications: Outpatient Prescriptions Prior to Visit  Medication Sig Dispense Refill  . aspirin EC 81 MG tablet Take 1 tablet (81 mg total) by mouth daily. 90 tablet 3  . clopidogrel (PLAVIX)  75 MG tablet Take 1 tablet (75 mg total) by mouth daily. 30 tablet 11  . ezetimibe (ZETIA) 10 MG tablet Take 1 tablet (10 mg total) by mouth daily. 30 tablet 11  . furosemide (LASIX) 20 MG tablet Take 1 tablet (20 mg total) by mouth every Monday, Wednesday, and Friday.    . isosorbide mononitrate (IMDUR) 30 MG 24 hr tablet Take 1 tablet (30 mg total) by mouth daily. 30 tablet 11  . losartan (COZAAR) 50 MG tablet Take 1 tablet (50 mg total) by mouth daily. 30 tablet 3  . nitroGLYCERIN (NITROSTAT) 0.4 MG SL tablet Place 1 tablet (0.4 mg total) under the tongue every 5 (five) minutes x 3 doses as needed for chest pain. 25  tablet 12  . carvedilol (COREG) 6.25 MG tablet Take 6.25 mg by mouth 2 (two) times daily with a meal. Reported on 06/20/2015  1   No facility-administered medications prior to visit.     Allergies:   Ramipril and Latex   Social History   Social History  . Marital Status: Married    Spouse Name: N/A  . Number of Children: N/A  . Years of Education: N/A   Occupational History  . Retired    Social History Main Topics  . Smoking status: Former Research scientist (life sciences)  . Smokeless tobacco: None  . Alcohol Use: No  . Drug Use: None  . Sexual Activity: Not Asked   Other Topics Concern  . None   Social History Narrative     Family History:  The patient's family history includes Stroke in his father.   ROS:   Please see the history of present illness.    ROSAll other systems reviewed and are negative.   Physical Exam:    VS:  BP 118/40 mmHg  Pulse 60  Ht 5\' 5"  (1.651 m)  Wt 165 lb 6.4 oz (75.025 kg)  BMI 27.52 kg/m2   GEN: Well nourished, well developed, in no acute distress HEENT: normal  Neck: no JVD, no masses Cardiac: Normal [id number], RRR; 2/6 systolic murmur heard best at the RUSB, trace LE edema bilaterally   Respiratory:  clear to auscultation bilaterally; no wheezing, rhonchi or rales GI: soft, nontender, nondistended  MS: no deformity or atrophy Skin: warm and dry, no rash Neuro:   no focal deficits  Psych: Alert and oriented x 3, normal affect  Wt Readings from Last 3 Encounters:  06/20/15 165 lb 6.4 oz (75.025 kg)  05/30/15 162 lb 3.2 oz (73.573 kg)  04/09/15 169 lb (76.658 kg)      Studies/Labs Reviewed:    EKG:  EKG is  ordered today.  The ekg ordered today demonstrates NSR, HR 61, first-degree AV block with a PR interval of 218 ms, LAD, nonspecific ST-T wave changes, anteroseptal Q waves, QTc 384 ms, T-wave changes improved on current tracing   Recent Labs: 01/27/2015: Magnesium 1.7; TSH 1.864 03/20/2015: ALT 20 06/07/2015: Hemoglobin 11.2*; Platelets  187 06/15/2015: BUN 23; Creat 1.93*; Potassium 4.6; Sodium 138   Recent Lipid Panel    Component Value Date/Time   CHOL 172 03/20/2015 0812   TRIG 62 03/20/2015 0812   HDL 44 03/20/2015 0812   CHOLHDL 3.9 03/20/2015 0812   VLDL 12 03/20/2015 0812   LDLCALC 116 03/20/2015 0812    Additional studies/ records that were reviewed today include:   Echo 05/31/15 Mild LVH, EF 40-45%, inf-septal HK, apical inf-septal AK, Gr 1 DD, mild AI, Aortic Sclerosis without stenosis (mean 15 mmHg), mod  MAC, mild to mod reduced RVSF  Echo 01/28/15 Inferoseptal HK, inferior HK, apical AK, anteroseptal HK, moderate LVH, EF 123XX123 grade 2 diastolic dysfunction, mild aortic stenosis (mean 9 mmHg), moderate AI, mildly dilated aortic root, ascending aortic diameter 40 mm (mildly dilated), mild MR, mild LAE, mild PI, PASP 36 mmHg  LHC 01/27/15 LM: 40% LAD: Mid 99%, 99% >> Synergy DES x 2 (overlapping) LCx: 100% RCA: 100% SVG-LAD: 100% SVG-OM2 90% >> Synergy DES SVG-RCA 100% EF 25-35%, 4+ MR 1. Severe three-vessel coronary artery disease with chronically occluded left circumflex and right coronary artery. Severe subtotal occlusion in the mid LAD. 2. Chronically occluded SVG to RCA (with left-to-right collaterals) and SVG to LAD. Severe 90% stenosis in the SVG to OM. 3. Severely reduced LV systolic function with segmental wall motion abnormalities as outlined above and severe mitral regurgitation. 4. Severely elevated left ventricular end-diastolic pressure. 5. Successful angioplasty and drug-eluting stent placement to the LAD 2 in an overlapped fashion and 1 drug-eluting stent placement to SVG to OM2.  Abd Aorta US 09/19/13 No AAA  Carotid US 09/19/13 Bilateral ICA 1-39% FU 2 years (due in 09/2015)    ASSESSMENT:    1. Coronary artery disease involving native coronary artery of native heart without angina pectoris   2. Ischemic cardiomyopathy   3. Chronic combined systolic and diastolic CHF (congestive  heart failure) ([place])   4. Aortic insufficiency   5. Essential hypertension   6. Hyperlipidemia   7. Carotid stenosis, bilateral   8. CKD (chronic kidney disease), stage 3 (moderate)     PLAN:    In order of problems listed above:  1. CAD - s/p CABG.  S/p non-STEMI treated with DES 2 to the native LAD and DES to SVG-OM 9/16.  He does have a chronically occluded RCA and occluded SVG-RCA.At last visit, he complained of prolonged episode of chest pain. Troponin level was minimally elevated and he was observed in the emergency room. There was no clear trend to his troponin levels and he was sent home. Follow-up echo since that time has demonstrated improved LV function since 9/16. Medical therapy has been continued.  No further anginal symptoms.  Continue beta blocker, nitrates, ARB, aspirin, Plavix.    2. Ischemic Cardiomyopathy: EF 25-30% by echocardiogram in 9/16.  Repeat Echo 1/17 demonstrates improved LV function with EF 40-45%.  Continue beta blocker, ARB, Isosorbide.   3. Chronic Combined Systolic and Diastolic CHF: Volume stable.  Repeat BMET today.   4. Aortic Insufficiency: Mild by recent echocardiogram.     5. HTN: Controlled.   6. Hyperlipidemia: Not on statin therapy secondary to elevated LFTs and CPK in the past. Referral to lipid clinic for consideration of PCSK-9 therapy has been previously discussed. He preferred to remain on Zetia.  I placed him on Zetia at his last visit.    7. Carotid stenosis:  FU carotid US is due in 5/17.   8. CKD -  Creatinine 2/3 was 1.93.  I decreased his Lasix.  Repeat BMET again today.      Medication Adjustments/Labs and Tests Ordered: Current medicines are reviewed at length with the patient today.  Concerns regarding medicines are outlined above.  Medication changes, Labs and Tests ordered today are outlined in the Patient Instructions noted below. Patient Instructions  Medication Instructions:  No changes. See your medication  list.  Please call us back and let us know if you are taking Coreg (Carvedilol) 3.125 mg Twice daily or 6.25 mg Twice  daily (both are listed on your medication list).  Labwork: Today - BMET  Testing/Procedures: None   Follow-Up: 1. Jamorion Gomillion, PAC 3 MONTHS  2. Dr. [name] in 6 months.    Any Other Special Instructions Will Be Listed Below (If Applicable). You can go to www.[facility].com to see if you can get Plavix any cheaper.  There is an app for your phone as well (just search for [place]).  I have seen Plavix as cheap as $10.95.  You will need to use the generic name (Clopidogrel) on this site.  You have to use the pharmacies listed on the site to get the medication.    If you need a refill on your cardiac medications before your next appointment, please call your pharmacy.   Signed, [name], PA-C  06/20/2015 9:19 AM    [facility] [address], [place], [place]  [address] Phone: [phone]; Fax: [phone]

## 2015-06-20 ENCOUNTER — Ambulatory Visit (INDEPENDENT_AMBULATORY_CARE_PROVIDER_SITE_OTHER): Payer: Medicare Other | Admitting: Physician Assistant

## 2015-06-20 ENCOUNTER — Encounter: Payer: Self-pay | Admitting: Physician Assistant

## 2015-06-20 ENCOUNTER — Telehealth: Payer: Self-pay | Admitting: *Deleted

## 2015-06-20 VITALS — BP 118/40 | HR 60 | Ht 65.0 in | Wt 165.4 lb

## 2015-06-20 DIAGNOSIS — I2510 Atherosclerotic heart disease of native coronary artery without angina pectoris: Secondary | ICD-10-CM | POA: Diagnosis not present

## 2015-06-20 DIAGNOSIS — I10 Essential (primary) hypertension: Secondary | ICD-10-CM

## 2015-06-20 DIAGNOSIS — I255 Ischemic cardiomyopathy: Secondary | ICD-10-CM

## 2015-06-20 DIAGNOSIS — I5042 Chronic combined systolic (congestive) and diastolic (congestive) heart failure: Secondary | ICD-10-CM | POA: Diagnosis not present

## 2015-06-20 DIAGNOSIS — N183 Chronic kidney disease, stage 3 (moderate): Secondary | ICD-10-CM

## 2015-06-20 DIAGNOSIS — I351 Nonrheumatic aortic (valve) insufficiency: Secondary | ICD-10-CM | POA: Diagnosis not present

## 2015-06-20 DIAGNOSIS — I6523 Occlusion and stenosis of bilateral carotid arteries: Secondary | ICD-10-CM

## 2015-06-20 DIAGNOSIS — E785 Hyperlipidemia, unspecified: Secondary | ICD-10-CM

## 2015-06-20 LAB — BASIC METABOLIC PANEL
BUN: 31 mg/dL — AB (ref 7–25)
CALCIUM: 8.9 mg/dL (ref 8.6–10.3)
CHLORIDE: 102 mmol/L (ref 98–110)
CO2: 27 mmol/L (ref 20–31)
CREATININE: 2.06 mg/dL — AB (ref 0.70–1.18)
GLUCOSE: 99 mg/dL (ref 65–99)
Potassium: 4.1 mmol/L (ref 3.5–5.3)
Sodium: 138 mmol/L (ref 135–146)

## 2015-06-20 NOTE — Telephone Encounter (Signed)
DPR for wife. Results given and changing labs to be done on 3/17 instead of 3/20. Patients wife confused on medications, will schedule appointment with pharmacy for med review per [name] [facility]

## 2015-06-20 NOTE — Patient Instructions (Addendum)
Medication Instructions:  No changes. See your medication list.  Please call us back and let us know if you are taking Coreg (Carvedilol) 3.125 mg Twice daily or 6.25 mg Twice daily (both are listed on your medication list).  Labwork: Today - BMET  Testing/Procedures: None   Follow-Up: 1. Clint Strupp, PAC 3 MONTHS  2. Dr. [name] in 6 months.    Any Other Special Instructions Will Be Listed Below (If Applicable). You can go to www.[facility].com to see if you can get Plavix any cheaper.  There is an app for your phone as well (just search for [place]).  I have seen Plavix as cheap as $10.95.  You will need to use the generic name (Clopidogrel) on this site.  You have to use the pharmacies listed on the site to get the medication.    If you need a refill on your cardiac medications before your next appointment, please call your pharmacy.

## 2015-06-21 NOTE — Telephone Encounter (Signed)
Spoke with wife and scheduled appt in pharmacy clinic to discuss meds next week. Pt will bring in all of his medications with him.

## 2015-06-27 ENCOUNTER — Ambulatory Visit: Payer: Medicare Other | Admitting: Pharmacist

## 2015-07-27 ENCOUNTER — Telehealth: Payer: Self-pay | Admitting: *Deleted

## 2015-07-27 ENCOUNTER — Other Ambulatory Visit (INDEPENDENT_AMBULATORY_CARE_PROVIDER_SITE_OTHER): Payer: Medicare Other | Admitting: *Deleted

## 2015-07-27 DIAGNOSIS — E785 Hyperlipidemia, unspecified: Secondary | ICD-10-CM

## 2015-07-27 DIAGNOSIS — I10 Essential (primary) hypertension: Secondary | ICD-10-CM

## 2015-07-27 DIAGNOSIS — N189 Chronic kidney disease, unspecified: Secondary | ICD-10-CM

## 2015-07-27 LAB — HEPATIC FUNCTION PANEL
ALBUMIN: 3.4 g/dL — AB (ref 3.6–5.1)
ALT: 13 U/L (ref 9–46)
AST: 17 U/L (ref 10–35)
Alkaline Phosphatase: 66 U/L (ref 40–115)
BILIRUBIN TOTAL: 0.5 mg/dL (ref 0.2–1.2)
Bilirubin, Direct: 0.1 mg/dL (ref <=0.2)
Indirect Bilirubin: 0.4 mg/dL (ref 0.2–1.2)
Total Protein: 6.2 g/dL (ref 6.1–8.1)

## 2015-07-27 LAB — BASIC METABOLIC PANEL
BUN: 53 mg/dL — ABNORMAL HIGH (ref 7–25)
CHLORIDE: 100 mmol/L (ref 98–110)
CO2: 23 mmol/L (ref 20–31)
Calcium: 8.7 mg/dL (ref 8.6–10.3)
Creat: 2.88 mg/dL — ABNORMAL HIGH (ref 0.70–1.18)
Glucose, Bld: 99 mg/dL (ref 65–99)
POTASSIUM: 3.6 mmol/L (ref 3.5–5.3)
SODIUM: 138 mmol/L (ref 135–146)

## 2015-07-27 LAB — LIPID PANEL
CHOL/HDL RATIO: 6.8 ratio — AB (ref <=5.0)
Cholesterol: 177 mg/dL (ref 125–200)
HDL: 26 mg/dL — AB (ref >=40)
LDL CALC: 128 mg/dL (ref <130)
Triglycerides: 117 mg/dL (ref <150)
VLDL: 23 mg/dL (ref <30)

## 2015-07-27 MED ORDER — FUROSEMIDE 20 MG PO TABS: 20.0000 mg | ORAL_TABLET | ORAL | Status: DC

## 2015-07-27 MED ORDER — LOSARTAN POTASSIUM 50 MG PO TABS: 50.0000 mg | ORAL_TABLET | Freq: Every day | ORAL | Status: DC

## 2015-07-27 NOTE — Telephone Encounter (Signed)
DPR on file for wife. Wife notified of lab results and advised per Dr. Caryl Comes (DOD) to d/c lasix and cozaar at this time w/repeat bmet 3/23. Went over instructions several times w/wife w/verbal understanding to plan of care.

## 2015-07-30 ENCOUNTER — Other Ambulatory Visit: Payer: Medicare Other

## 2015-08-02 ENCOUNTER — Other Ambulatory Visit: Payer: Medicare Other

## 2015-08-10 ENCOUNTER — Telehealth: Payer: Self-pay | Admitting: *Deleted

## 2015-08-10 NOTE — Telephone Encounter (Signed)
Lmtcb to go over recommendations about referring pt to Lipid Clinic. Pt never came in for his lab on 3/23.

## 2015-08-14 ENCOUNTER — Encounter: Payer: Self-pay | Admitting: *Deleted

## 2015-08-14 NOTE — Telephone Encounter (Signed)
mail box full could not lmtcb . I will send out letter today for ptcb to go over recemmendations from [place].

## 2015-08-28 ENCOUNTER — Emergency Department (HOSPITAL_COMMUNITY)
Admission: EM | Admit: 2015-08-28 | Discharge: 2015-08-29 | Discharge status: Against Medical Advice | Payer: Medicare Other | Attending: Emergency Medicine | Admitting: Emergency Medicine

## 2015-08-28 ENCOUNTER — Encounter (HOSPITAL_COMMUNITY): Payer: Self-pay | Admitting: *Deleted

## 2015-08-28 ENCOUNTER — Emergency Department (HOSPITAL_COMMUNITY): Payer: Medicare Other

## 2015-08-28 DIAGNOSIS — Z8739 Personal history of other diseases of the musculoskeletal system and connective tissue: Secondary | ICD-10-CM | POA: Diagnosis not present

## 2015-08-28 DIAGNOSIS — F0390 Unspecified dementia without behavioral disturbance: Secondary | ICD-10-CM | POA: Insufficient documentation

## 2015-08-28 DIAGNOSIS — Z8546 Personal history of malignant neoplasm of prostate: Secondary | ICD-10-CM | POA: Insufficient documentation

## 2015-08-28 DIAGNOSIS — I10 Essential (primary) hypertension: Secondary | ICD-10-CM | POA: Diagnosis not present

## 2015-08-28 DIAGNOSIS — Z8711 Personal history of peptic ulcer disease: Secondary | ICD-10-CM | POA: Insufficient documentation

## 2015-08-28 DIAGNOSIS — Z880 Allergy status to penicillin: Secondary | ICD-10-CM | POA: Insufficient documentation

## 2015-08-28 DIAGNOSIS — Z91040 Latex allergy status: Secondary | ICD-10-CM | POA: Insufficient documentation

## 2015-08-28 DIAGNOSIS — Z87891 Personal history of nicotine dependence: Secondary | ICD-10-CM | POA: Diagnosis not present

## 2015-08-28 DIAGNOSIS — Z7902 Long term (current) use of antithrombotics/antiplatelets: Secondary | ICD-10-CM | POA: Diagnosis not present

## 2015-08-28 DIAGNOSIS — Z8719 Personal history of other diseases of the digestive system: Secondary | ICD-10-CM | POA: Insufficient documentation

## 2015-08-28 DIAGNOSIS — Z9889 Other specified postprocedural states: Secondary | ICD-10-CM | POA: Insufficient documentation

## 2015-08-28 DIAGNOSIS — R0602 Shortness of breath: Secondary | ICD-10-CM | POA: Diagnosis present

## 2015-08-28 DIAGNOSIS — Z79899 Other long term (current) drug therapy: Secondary | ICD-10-CM | POA: Insufficient documentation

## 2015-08-28 DIAGNOSIS — I5021 Acute systolic (congestive) heart failure: Secondary | ICD-10-CM | POA: Insufficient documentation

## 2015-08-28 DIAGNOSIS — I2510 Atherosclerotic heart disease of native coronary artery without angina pectoris: Secondary | ICD-10-CM | POA: Diagnosis not present

## 2015-08-28 LAB — BASIC METABOLIC PANEL
ANION GAP: 13 (ref 5–15)
BUN: 21 mg/dL — ABNORMAL HIGH (ref 6–20)
CALCIUM: 9.3 mg/dL (ref 8.9–10.3)
CO2: 20 mmol/L — AB (ref 22–32)
CREATININE: 1.71 mg/dL — AB (ref 0.61–1.24)
Chloride: 105 mmol/L (ref 101–111)
GFR, EST AFRICAN AMERICAN: 42 mL/min — AB (ref >60)
GFR, EST NON AFRICAN AMERICAN: 37 mL/min — AB (ref >60)
Glucose, Bld: 97 mg/dL (ref 65–99)
Potassium: 4.3 mmol/L (ref 3.5–5.1)
SODIUM: 138 mmol/L (ref 135–145)

## 2015-08-28 LAB — CBC
HEMATOCRIT: 37.8 % — AB (ref 39.0–52.0)
Hemoglobin: 12.2 g/dL — ABNORMAL LOW (ref 13.0–17.0)
MCH: 27.2 pg (ref 26.0–34.0)
MCHC: 32.3 g/dL (ref 30.0–36.0)
MCV: 84.2 fL (ref 78.0–100.0)
PLATELETS: 178 10*3/uL (ref 150–400)
RBC: 4.49 MIL/uL (ref 4.22–5.81)
RDW: 14.1 % (ref 11.5–15.5)
WBC: 5.2 10*3/uL (ref 4.0–10.5)

## 2015-08-28 NOTE — ED Provider Notes (Signed)
CSN: [id number]     Arrival date & time 08/28/15  1805 History  By signing my name below, I, Rowan Blase, attest that this documentation has been prepared under the direction and in the presence of Jola Schmidt, MD . Electronically Signed: Rowan Blase, Scribe. 08/29/2015. 12:31 AM.   Chief Complaint  Patient presents with  . Shortness of Breath   The history is provided by the patient. No language interpreter was used.   HPI Comments:  [name] is a 81 y.o. male with PMHx of HTN, HLD, CAD and cardiomyopathy who presents to the Emergency Department complaining of intermittent worsening shortness of breath for the past 4-5 days. Pt reports associated 5/10 chest discomfort and pressure. He notes SOB is occasionally exacerbated with walking and chest discomfort. Pt saw PCP Dr. Kenton Kingfisher for current symptoms earlier today and was referred to the ED for further evaluation. Wife reports pt stopped taking a medication recently but she is unsure which medication. Wife reports pt had stents placed in September 2016 and cardiac surgery in 2003; she notes pt has decreased physical activity since stent placement.  Pt denies productive cough, fever, chills, appetite change, vomiting, diarrhea, or blood in stool.  Cardiologist - Dr. Percival Spanish and [name], PA  Past Medical History  Diagnosis Date  . Hypertension   . Prostate CA ([place])     a. s/p seed implants  . Hyperlipidemia   . Peptic ulcer disease   . Internal hemorrhoids   . Carotid stenosis     a. 09/2013: carotid dopplers with stable 1-39% ICA stenosis bilaterally  . CAD (coronary artery disease)     a. CABG (2003)  b. 9/17/6: NSTEMI s/p overlapping DES x2 to LAD and DESx1 to SVG-->OM   . Dementia   . Ischemic cardiomyopathy     a. 2D ECHO 01/28/15 w/ EF 25-30%, severe HK of the basal-midinferoseptal myocardium, basal-midinferior myocardium and midanteroseptal myocardium. Akinesis of apical inferior and apicalanterior myocardium. G2DD.  Mild AS, Asc aorta diameter 76mm. Mild dilation of aortic root. Mild MR, mild PR and PA peak pressure 36 mmg HG.   Past Surgical History  Procedure Laterality Date  . Cardiac surgery  2003    SVG LAD, SVG to right coronary artery, SVG to marginal  . Cardiac catheterization N/A 01/27/2015    Procedure: Left Heart Cath and Coronary Angiography;  Surgeon: Wellington Hampshire, MD;  Location: [facility];  Service: Cardiovascular;  Laterality: N/A;   Family History  Problem Relation Age of Onset  . Stroke Father    Social History  Substance Use Topics  . Smoking status: Former Research scientist (life sciences)  . Smokeless tobacco: None  . Alcohol Use: No    Review of Systems  10 systems reviewed and all are negative for acute change except as noted in the HPI.   Allergies  Ramipril and Latex  Home Medications   Prior to Admission medications   Medication Sig Start Date End Date Taking? Authorizing Provider  aspirin EC 81 MG tablet Take 1 tablet (81 mg total) by mouth daily. 09/08/13  Yes [name], MD  clopidogrel (PLAVIX) 75 MG tablet Take 1 tablet (75 mg total) by mouth daily. 06/04/15  Yes Liliane Shi, PA-C  carvedilol (COREG) 6.25 MG tablet Take 6.25 mg by mouth 2 (two) times daily with a meal. Reported on 06/20/2015 05/15/15   Historical Provider, MD  ezetimibe (ZETIA) 10 MG tablet Take 1 tablet (10 mg total) by mouth daily. 05/30/15  Liliane Shi, PA-C  furosemide (LASIX) 20 MG tablet Take 1 tablet (20 mg total) by mouth every Monday, Wednesday, and Friday. 3/17 ON HOLD 07/27/15   Liliane Shi, PA-C  isosorbide mononitrate (IMDUR) 30 MG 24 hr tablet Take 1 tablet (30 mg total) by mouth daily. 05/30/15   Liliane Shi, PA-C  losartan (COZAAR) 50 MG tablet Take 1 tablet (50 mg total) by mouth daily. 3/17 ON HOLD 07/27/15   Liliane Shi, PA-C  nitroGLYCERIN (NITROSTAT) 0.4 MG SL tablet Place 1 tablet (0.4 mg total) under the tongue every 5 (five) minutes x 3 doses as needed for chest pain. 01/30/15    Eileen Stanford, PA-C   BP 150/71 mmHg  Pulse 71  Temp(Src) 97.6 F (36.4 C) (Oral)  Resp 19  Wt 151 lb (68.493 kg)  SpO2 100% Physical Exam  Constitutional: He is oriented to person, place, and time. He appears well-developed and well-nourished.  HENT:  Head: Normocephalic and atraumatic.  Eyes: EOM are normal.  Neck: Normal range of motion.  Cardiovascular: Normal rate, regular rhythm, normal heart sounds and intact distal pulses.   Pulmonary/Chest: Effort normal and breath sounds normal. No respiratory distress.  Abdominal: Soft. He exhibits no distension. There is no tenderness.  Musculoskeletal: Normal range of motion.  Neurological: He is alert and oriented to person, place, and time.  Skin: Skin is warm and dry.  Psychiatric: He has a normal mood and affect. Judgment normal.  Nursing note and vitals reviewed.   ED Course  Procedures  DIAGNOSTIC STUDIES:  Oxygen Saturation is 100% on RA, normal by my interpretation.    COORDINATION OF CARE:  11:55 PM Discussed treatment plan with pt at bedside and pt agreed to plan.  Labs Review Labs Reviewed  CBC - Abnormal; Notable for the following:    Hemoglobin 12.2 (*)    HCT 37.8 (*)    All other components within normal limits  BASIC METABOLIC PANEL - Abnormal; Notable for the following:    CO2 20 (*)    BUN 21 (*)    Creatinine, Ser 1.71 (*)    GFR calc non Af Amer 37 (*)    GFR calc Af Amer 42 (*)    All other components within normal limits  BRAIN NATRIURETIC PEPTIDE - Abnormal; Notable for the following:    B Natriuretic Peptide 1063.8 (*)    All other components within normal limits  TROPONIN I  I-STAT TROPOININ, ED    Imaging Review Dg Chest 2 View  08/28/2015  CLINICAL DATA:  Progressive shortness of breath over the last few days. Patient presented to primary care physician and had an abnormal EKG today, referred to the emergency department. EXAM: CHEST  2 VIEW COMPARISON:  05/30/2015 FINDINGS:  Patient is post median sternotomy. Mild cardiomegaly which appears new. Thoracic aorta is tortuous. Coronary stent is seen. There are small bilateral pleural effusions and probable fluid in the fissures. Adjacent bibasilar opacities consistent with atelectasis. No pulmonary edema. No confluent airspace disease to suggest pneumonia. No pneumothorax. No acute osseous abnormalities are seen. IMPRESSION: Mild cardiomegaly, new, with small pleural effusions. Findings suggest fluid overload. No significant pulmonary edema. Electronically Signed   By: Jeb Levering M.D.   On: 08/28/2015 19:03   I have personally reviewed and evaluated these images and lab results as part of my medical decision-making.   EKG Interpretation   Date/Time:  Wednesday August 29 2015 00:20:52 EDT Ventricular Rate:  81 PR Interval:  174 QRS Duration: 101 QT Interval:  412 QTC Calculation: 478 R Axis:   -38 Text Interpretation:  Sinus rhythm Left ventricular hypertrophy Anterior Q  waves, possibly due to LVH Nonspecific T abnormalities, lateral leads No  significant change was found Confirmed by Larren Copes  MD, Lennette Bihari ([id number]) on  08/29/2015 12:47:35 AM      MDM   Final diagnoses:  None    Patient was seen and evaluated in the emergency department prior to my ability to discuss the outcome of his labs and x-rays with them, when he and his wife left the emergency department.  I was dealing with a critical patient was unable to discuss the risk of leaving.  I have discussed with them on the phone the results of his lab tests and his x-ray and the need for cardiology consultation and likely admission the hospital for ongoing workup of his intermittent chest discomfort pressure and shortness of breath with elevated BNP and edema on x-ray.  They have told me that they will return.    I personally performed the services described in this documentation, which was scribed in my presence. The recorded information has been reviewed  and is accurate.       Jola Schmidt, MD 08/29/15 [phone]

## 2015-08-28 NOTE — ED Notes (Signed)
SOB increasing over the weekend.  Pt saw PCP and had EKG and was told to come to ED.  Pt denies any CP or edema.  No cough, cold or fever

## 2015-08-28 NOTE — ED Notes (Signed)
MD at bedside. 

## 2015-08-29 LAB — BRAIN NATRIURETIC PEPTIDE: B Natriuretic Peptide: 1063.8 pg/mL — ABNORMAL HIGH (ref 0.0–100.0)

## 2015-08-29 LAB — TROPONIN I: TROPONIN I: 0.03 ng/mL (ref <0.031)

## 2015-08-29 MED ORDER — FUROSEMIDE 10 MG/ML IJ SOLN: 60.0000 mg | Freq: Once | INTRAMUSCULAR | Status: DC

## 2015-09-16 NOTE — Progress Notes (Signed)
Cardiology Office Note:    Date:  09/17/2015   ID:  [name], DOB [birth year], MRN [id number]  PCP:  Shirline Frees, MD  Cardiologist:  Dr. [name]   Electrophysiologist:  n/a  Referring MD: Shirline Frees, MD   Chief Complaint  Patient presents with  . Congestive Heart Failure    Follow up    History of Present Illness:     Koltan Yanagi is a 81 y.o. male with a hx of CAD, s/p CABG in 2003, aortic insufficiency, HTN, HL, carotid stenosis. He has been nonadherent to follow-up and medications in the past. He has a history of elevated LFTs and total CK on statin therapy in the past.  Admitted 9/16 with a non-STEMI. LHC demonstrated severe 3 vessel CAD with a chronically occluded LCx, RCA and severe subtotal occlusion of the native mid LAD, chronically occluded SVG-RCA and SVG-LAD with severe stenosis and SVG-OM. He underwent PCI with DES 2 in overlapping fashion to the native LAD as well as DES to the SVG-OM. Echocardiogram demonstrated an EF of 25-30%, mild AS, moderate AI and mild MR.    I saw him in 1/17 with complaints of prolonged chest pain and a stat Troponin was elevated.  He was sent to the ED. Troponin levels remained minimally elevated without clear trend. He was evaluated and sent home by the Cardiology Fellow.  FU echo demonstrated that EF has improved to 40-45%. FU labs demonstrated worsening Creatinine and we decreased his Furosemide. He c/o epistaxis and I reviewed his case with Dr.Hochrein. We changed his Brilinta to Plavix.  Last seen by me in 2/17.  Seen in ED 08/28/15 with acute HF.  Patient left AMA before being treated.   Returns for FU.  Here with his wife.  I spent several minutes going over medications today.  They have some confusion about medications and I did offer pharmacy consultation for med reconciliation.  He thinks his current system is ok and declines.  He had occ chest pain. This seems to be better.  Denies significant dyspnea.  Denies  orthopnea, PND, edema.  Denies syncope.  He did resume Lasix after his trip to the ED.  He ran out 2 days ago.     Past Medical History  Diagnosis Date  . Hypertension   . Prostate CA ([place])     a. s/p seed implants  . Hyperlipidemia   . Peptic ulcer disease   . Internal hemorrhoids   . Carotid stenosis     a. 09/2013: carotid dopplers with stable 1-39% ICA stenosis bilaterally  . CAD (coronary artery disease)     a. CABG (2003)  b. 9/17/6: NSTEMI s/p overlapping DES x2 to LAD and DESx1 to SVG-->OM   . Dementia   . Ischemic cardiomyopathy     a. 2D ECHO 01/28/15 w/ EF 25-30%, severe HK of the basal-midinferoseptal myocardium, basal-midinferior myocardium and midanteroseptal myocardium. Akinesis of apical inferior and apicalanterior myocardium. G2DD. Mild AS, Asc aorta diameter 27mm. Mild dilation of aortic root. Mild MR, mild PR and PA peak pressure 36 mmg HG.    Past Surgical History  Procedure Laterality Date  . Cardiac surgery  2003    SVG LAD, SVG to right coronary artery, SVG to marginal  . Cardiac catheterization N/A 01/27/2015    Procedure: Left Heart Cath and Coronary Angiography;  Surgeon: Wellington Hampshire, MD;  Location: [facility];  Service: Cardiovascular;  Laterality: N/A;    Current Medications: Outpatient Prescriptions Prior to  Visit  Medication Sig Dispense Refill  . aspirin EC 81 MG tablet Take 1 tablet (81 mg total) by mouth daily. 90 tablet 3  . clopidogrel (PLAVIX) 75 MG tablet Take 1 tablet (75 mg total) by mouth daily. 30 tablet 11  . ezetimibe (ZETIA) 10 MG tablet Take 1 tablet (10 mg total) by mouth daily. 30 tablet 11  . isosorbide mononitrate (IMDUR) 30 MG 24 hr tablet Take 1 tablet (30 mg total) by mouth daily. 30 tablet 11  . nitroGLYCERIN (NITROSTAT) 0.4 MG SL tablet Place 1 tablet (0.4 mg total) under the tongue every 5 (five) minutes x 3 doses as needed for chest pain. 25 tablet 12  . carvedilol (COREG) 6.25 MG tablet Take 6.25 mg by mouth 2  (two) times daily with a meal. Reported on 06/20/2015  1  . losartan (COZAAR) 50 MG tablet Take 1 tablet (50 mg total) by mouth daily. 3/17 ON HOLD (Patient not taking: Reported on 09/17/2015)    . furosemide (LASIX) 20 MG tablet Take 1 tablet (20 mg total) by mouth every Monday, Wednesday, and Friday. 3/17 ON HOLD (Patient not taking: Reported on 09/17/2015)     No facility-administered medications prior to visit.      Allergies:   Ramipril and Latex   Social History   Social History  . Marital Status: Married    Spouse Name: N/A  . Number of Children: N/A  . Years of Education: N/A   Occupational History  . Retired    Social History Main Topics  . Smoking status: Former Research scientist (life sciences)  . Smokeless tobacco: None  . Alcohol Use: No  . Drug Use: None  . Sexual Activity: Not Asked   Other Topics Concern  . None   Social History Narrative     Family History:  The patient's family history includes Stroke in his father.   ROS:   Please see the history of present illness.    Review of Systems  Cardiovascular: Positive for chest pain.   All other systems reviewed and are negative.   Physical Exam:    VS:  BP 148/64 mmHg  Pulse 69  Ht 5\' 5"  (1.651 m)  Wt 144 lb 12.8 oz (65.681 kg)  BMI 24.10 kg/m2  SpO2 98%   GEN: Well nourished, well developed, in no acute distress HEENT: normal Neck: no JVD, no masses Cardiac: Normal S1/S2, RRR; no murmurs, rubs, or gallops, trace bilat LE edema;     Respiratory:  clear to auscultation bilaterally; no wheezing, rhonchi or rales GI: soft, nontender, nondistended MS: no deformity or atrophy Skin: warm and dry Neuro: No focal deficits  Psych: Alert and oriented x 3, normal affect  Wt Readings from Last 3 Encounters:  09/17/15 144 lb 12.8 oz (65.681 kg)  08/28/15 151 lb (68.493 kg)  06/20/15 165 lb 6.4 oz (75.025 kg)      Studies/Labs Reviewed:     EKG:  EKG is not ordered today.  The ekg ordered today demonstrates n/a  Recent  Labs: 01/27/2015: Magnesium 1.7; TSH 1.864 07/27/2015: ALT 13 08/28/2015: B Natriuretic Peptide 1063.8*; BUN 21*; Creatinine, Ser 1.71*; Hemoglobin 12.2*; Platelets 178; Potassium 4.3; Sodium 138   Recent Lipid Panel    Component Value Date/Time   CHOL 177 07/27/2015 0845   TRIG 117 07/27/2015 0845   HDL 26* 07/27/2015 0845   CHOLHDL 6.8* 07/27/2015 0845   VLDL 23 07/27/2015 0845   LDLCALC 128 07/27/2015 0845    Additional studies/ records that were  reviewed today include:   Echo 05/31/15 Mild LVH, EF 40-45%, inf-septal HK, apical inf-septal AK, Gr 1 DD, mild AI, Aortic Sclerosis without stenosis (mean 15 mmHg), mod MAC, mild to mod reduced RVSF  Echo 01/28/15 Inferoseptal HK, inferior HK, apical AK, anteroseptal HK, moderate LVH, EF [id number] grade 2 diastolic dysfunction, mild aortic stenosis (mean 9 mmHg), moderate AI, mildly dilated aortic root, ascending aortic diameter 40 mm (mildly dilated), mild MR, mild LAE, mild PI, PASP 36 mmHg  LHC 01/27/15 LM: 40% LAD: Mid 99%, 99% >> Synergy DES x 2 (overlapping) LCx: 100% RCA: 100% SVG-LAD: 100% SVG-OM2 90% >> Synergy DES SVG-RCA 100% EF 25-35%, 4+ MR 1. Severe three-vessel coronary artery disease with chronically occluded left circumflex and right coronary artery. Severe subtotal occlusion in the mid LAD. 2. Chronically occluded SVG to RCA (with left-to-right collaterals) and SVG to LAD. Severe 90% stenosis in the SVG to OM. 3. Severely reduced LV systolic function with segmental wall motion abnormalities as outlined above and severe mitral regurgitation. 4. Severely elevated left ventricular end-diastolic pressure. 5. Successful angioplasty and drug-eluting stent placement to the LAD 2 in an overlapped fashion and 1 drug-eluting stent placement to SVG to OM2.  Abd Aorta US 09/19/13 No AAA  Carotid US 09/19/13 Bilateral ICA 1-39% FU 2 years (due in 09/2015)   ASSESSMENT:     1. Coronary artery disease involving native coronary  artery of native heart without angina pectoris   2. Ischemic cardiomyopathy   3. Chronic combined systolic and diastolic CHF (congestive heart failure) ([place])   4. Aortic insufficiency   5. Essential hypertension   6. Hyperlipidemia   7. Carotid stenosis, bilateral   8. CKD (chronic kidney disease), stage 3 (moderate)     PLAN:     In order of problems listed above:  1. CAD - s/p CABG. S/p non-STEMI treated with DES 2 to the native LAD and DES to SVG-OM 9/16. He does have a chronically occluded RCA and occluded SVG-RCA. In 1/17, he complained of prolonged episode of chest pain and minimally elevated Troponin levels without clear trend. Follow-up echo demonstrated improved LV function.  Overall symptoms improved. Continue beta blocker, nitrates, aspirin, Plavix. He is off ARB due to worsening renal function.    2. Ischemic Cardiomyopathy: EF is now 40-45%. Continue beta blocker, Isosorbide. He has a lot of confusion regarding medications.  He would benefit from Hydralazine but I am concerned about confusing him more.  I will attempt to keep things simple and continue all current medications.    3. Chronic Combined Systolic and Diastolic CHF: Recent visit to ED with volume overload. Patient left AMA before being treated.  Volume seems stable now.  He resumed Lasix after he went to the ED.  Continue Lasix QD.  BMET today.    4. Aortic Insufficiency: Mild by recent echocardiogram.   5. HTN: Fair control.   6. Hyperlipidemia: Not on statin therapy secondary to elevated LFTs and CPK in the past.  He also could not take Zetia for unclear reasons.  We discussed referral to the Lipid clinic but he declines.   7. Carotid stenosis: FU carotid US is due in 5/17. Will arrange.   8. CKD - BMET today.    Medication Adjustments/Labs and Tests Ordered: Current medicines are reviewed at length with the patient today.  Concerns regarding medicines are outlined above.  Medication changes,  Labs and Tests ordered today are outlined in the Patient Instructions noted below. Patient Instructions  Medication  Instructions:  A REFILLS SENT IN FOR LAS AND FOR THE COREG Labwork: TODAY BMET Testing/Procedures: Your physician has requested that you have a carotid duplex. This test is an ultrasound of the carotid arteries in your neck. It looks at blood flow through these arteries that supply the brain with blood. Allow one hour for this exam. There are no restrictions or special instructions. TO BE DONE AT THE [facility] SAME DAY AS DR. [place] APP IN 12/2015 Follow-Up: PLEASE SCHEDULE DR. HOCHREIN 12/2015  Any Other Special Instructions Will Be Listed Below (If Applicable). If you need a refill on your cardiac medications before your next appointment, please call your pharmacy.    Signed, [name], PA-C  09/17/2015 9:51 AM    [facility] [address], [place], [place]  [address] Phone: [phone]; Fax: [phone]

## 2015-09-17 ENCOUNTER — Encounter: Payer: Self-pay | Admitting: Physician Assistant

## 2015-09-17 ENCOUNTER — Ambulatory Visit (INDEPENDENT_AMBULATORY_CARE_PROVIDER_SITE_OTHER): Payer: Medicare Other | Admitting: Physician Assistant

## 2015-09-17 ENCOUNTER — Other Ambulatory Visit: Payer: Self-pay | Admitting: *Deleted

## 2015-09-17 VITALS — BP 148/64 | HR 69 | Ht 65.0 in | Wt 144.8 lb

## 2015-09-17 DIAGNOSIS — I25110 Atherosclerotic heart disease of native coronary artery with unstable angina pectoris: Secondary | ICD-10-CM

## 2015-09-17 DIAGNOSIS — I6523 Occlusion and stenosis of bilateral carotid arteries: Secondary | ICD-10-CM

## 2015-09-17 DIAGNOSIS — I2510 Atherosclerotic heart disease of native coronary artery without angina pectoris: Secondary | ICD-10-CM

## 2015-09-17 DIAGNOSIS — I255 Ischemic cardiomyopathy: Secondary | ICD-10-CM

## 2015-09-17 DIAGNOSIS — N183 Chronic kidney disease, stage 3 (moderate): Secondary | ICD-10-CM

## 2015-09-17 DIAGNOSIS — I10 Essential (primary) hypertension: Secondary | ICD-10-CM

## 2015-09-17 DIAGNOSIS — I5042 Chronic combined systolic (congestive) and diastolic (congestive) heart failure: Secondary | ICD-10-CM

## 2015-09-17 DIAGNOSIS — I351 Nonrheumatic aortic (valve) insufficiency: Secondary | ICD-10-CM | POA: Diagnosis not present

## 2015-09-17 DIAGNOSIS — E785 Hyperlipidemia, unspecified: Secondary | ICD-10-CM

## 2015-09-17 LAB — BASIC METABOLIC PANEL
BUN: 21 mg/dL (ref 7–25)
CALCIUM: 9.2 mg/dL (ref 8.6–10.3)
CO2: 25 mmol/L (ref 20–31)
CREATININE: 1.51 mg/dL — AB (ref 0.70–1.18)
Chloride: 106 mmol/L (ref 98–110)
Glucose, Bld: 85 mg/dL (ref 65–99)
Potassium: 4.7 mmol/L (ref 3.5–5.3)
Sodium: 140 mmol/L (ref 135–146)

## 2015-09-17 MED ORDER — FUROSEMIDE 20 MG PO TABS: 20.0000 mg | ORAL_TABLET | Freq: Every day | ORAL | Status: DC

## 2015-09-17 MED ORDER — CARVEDILOL 6.25 MG PO TABS: 6.2500 mg | ORAL_TABLET | Freq: Two times a day (BID) | ORAL | Status: DC

## 2015-09-17 MED ORDER — CLOPIDOGREL BISULFATE 75 MG PO TABS: 75.0000 mg | ORAL_TABLET | Freq: Every day | ORAL | Status: DC

## 2015-09-17 NOTE — Patient Instructions (Addendum)
Medication Instructions:  A REFILLS SENT IN FOR LAS AND FOR THE COREG Labwork: TODAY BMET Testing/Procedures: Your physician has requested that you have a carotid duplex. This test is an ultrasound of the carotid arteries in your neck. It looks at blood flow through these arteries that supply the brain with blood. Allow one hour for this exam. There are no restrictions or special instructions. TO BE DONE AT THE [facility] SAME DAY AS DR. [place] APP IN 12/2015 Follow-Up: PLEASE SCHEDULE DR. HOCHREIN 12/2015  Any Other Special Instructions Will Be Listed Below (If Applicable). If you need a refill on your cardiac medications before your next appointment, please call your pharmacy.

## 2015-09-17 NOTE — Telephone Encounter (Signed)
Refill for plavix

## 2015-09-18 ENCOUNTER — Telehealth: Payer: Self-pay | Admitting: *Deleted

## 2015-09-18 NOTE — Telephone Encounter (Signed)
Mail box full, could not lm. I will try again later to reach pt.

## 2015-09-21 NOTE — Telephone Encounter (Signed)
VM box full on both home and cell #.

## 2015-09-24 ENCOUNTER — Encounter: Payer: Self-pay | Admitting: *Deleted

## 2015-09-24 NOTE — Telephone Encounter (Signed)
Tried again to reach pt to go over lab results. I will send out letter to pt today.

## 2015-12-17 NOTE — Progress Notes (Signed)
Cardiology Office Note   Date:  12/19/2015   ID:  [name], DOB [birth year], MRN [id number]  PCP:  Shirline Frees, MD  Cardiologist:   [name], MD   Chief Complaint  Patient presents with  . Coronary Artery Disease      History of Present Illness: [name] is a 81 y.o. male who presents for follow-up of coronary disease. He has a history of CABG but was not particularly compliant with follow-up. He was admitted in September with and NSTEMI.  He underwent DES stenting with 2 overlapping stents to the LAD. He also had a drug-eluting stent to SVG to OM. His EF was 25-30% and he was managed for heart failure.  He was in the ED early this year.  He was seen in consultation.   His EF was slightly improved to 40 -45%.   He has had some issues with epistaxis and volume overload.  He was last seen by Mr. Kathlen Mody PA in May.    Since I last saw him he has done well.  He has lost quite a bit of weight over a couple of years.  However, he denies any acute complaints.  The patient denies any new symptoms such as chest discomfort, neck or arm discomfort. There has been no new shortness of breath, PND or orthopnea. There have been no reported palpitations, presyncope or syncope.Marland Kitchen He vaguely says that he rarely has to take NTG.   Past Medical History:  Diagnosis Date  . CAD (coronary artery disease)    a. CABG (2003)  b. 9/17/6: NSTEMI s/p overlapping DES x2 to LAD and DESx1 to SVG-->OM   . Carotid stenosis    a. 09/2013: carotid dopplers with stable 1-39% ICA stenosis bilaterally  . Dementia   . Hyperlipidemia   . Hypertension   . Internal hemorrhoids   . Ischemic cardiomyopathy    a. 2D ECHO 01/28/15 w/ EF 25-30%, severe HK of the basal-midinferoseptal myocardium, basal-midinferior myocardium and midanteroseptal myocardium. Akinesis of apical inferior and apicalanterior myocardium. G2DD. Mild AS, Asc aorta diameter 105mm. Mild dilation of aortic root. Mild MR, mild PR and PA peak  pressure 36 mmg HG.  . Peptic ulcer disease   . Prostate CA [facility])    a. s/p seed implants    Past Surgical History:  Procedure Laterality Date  . CARDIAC CATHETERIZATION N/A 01/27/2015   Procedure: Left Heart Cath and Coronary Angiography;  Surgeon: Wellington Hampshire, MD;  Location: [facility];  Service: Cardiovascular;  Laterality: N/A;  . CARDIAC SURGERY  2003   SVG LAD, SVG to right coronary artery, SVG to marginal     Current Outpatient Prescriptions  Medication Sig Dispense Refill  . aspirin EC 81 MG tablet Take 1 tablet (81 mg total) by mouth daily. 90 tablet 3  . carvedilol (COREG) 6.25 MG tablet Take 1 tablet (6.25 mg total) by mouth 2 (two) times daily with a meal. Reported on 06/20/2015 180 tablet 3  . clopidogrel (PLAVIX) 75 MG tablet Take 1 tablet (75 mg total) by mouth daily. 90 tablet 3  . furosemide (LASIX) 20 MG tablet Take 1 tablet (20 mg total) by mouth daily. 3/17 ON HOLD 90 tablet 3  . isosorbide mononitrate (IMDUR) 30 MG 24 hr tablet Take 1 tablet (30 mg total) by mouth daily. 30 tablet 11  . nitroGLYCERIN (NITROSTAT) 0.4 MG SL tablet Place 1 tablet (0.4 mg total) under the tongue every 5 (five) minutes x 3 doses as  needed for chest pain. 25 tablet 12   No current facility-administered medications for this visit.     Allergies:   Ramipril and Latex    ROS:  Please see the history of present illness.   Otherwise, review of systems are positive for anxiety .   All other systems are reviewed and negative.    PHYSICAL EXAM: VS:  BP (!) 108/52   Pulse (!) 53   Ht 5\' 6"  (1.676 m)   Wt 144 lb 12.8 oz (65.7 kg)   BMI 23.37 kg/m  , BMI Body mass index is 23.37 kg/m. GENERAL:  Well appearing, thin NECK:  No jugular venous distention, waveform within normal limits, carotid upstroke brisk and symmetric, no bruits, no thyromegaly LUNGS:  Clear to auscultation bilaterally BACK:  No CVA tenderness CHEST:  Unremarkable, well healed sternotomy scar, HEART:  PMI  not displaced or sustained,S1 and S2 within normal limits, no S3, no S4, no clicks, no rubs, soft left upper sternal border early peaking systolic murmur, no diastolic murmurs ABD:  Flat, positive bowel sounds normal in frequency in pitch, positive bruits, no rebound, no guarding, no midline pulsatile mass, no hepatomegaly, no splenomegaly EXT:  2 plus pulses throughout, mild ankle edema, no cyanosis no clubbing, bilateral femoral bruits   EKG:  EKG is not  ordered today.   Recent Labs: 01/27/2015: Magnesium 1.7; TSH 1.864 07/27/2015: ALT 13 08/28/2015: B Natriuretic Peptide 1,063.8; Hemoglobin 12.2; Platelets 178 09/17/2015: BUN 21; Creat 1.51; Potassium 4.7; Sodium 140    Lipid Panel    Component Value Date/Time   CHOL 177 07/27/2015 0845   TRIG 117 07/27/2015 0845   HDL 26 (L) 07/27/2015 0845   CHOLHDL 6.8 (H) 07/27/2015 0845   VLDL 23 07/27/2015 0845   LDLCALC 128 07/27/2015 0845      Wt Readings from Last 3 Encounters:  12/19/15 144 lb 12.8 oz (65.7 kg)  09/17/15 144 lb 12.8 oz (65.7 kg)  08/28/15 151 lb (68.5 kg)      Other studies Reviewed: Additional studies/ records that were reviewed today include: Hospital records.  (Greater than 40 minutes reviewing all data .) Review of the above records demonstrates:  Please see elsewhere in the note.     ASSESSMENT AND PLAN:  CAD Status Post CABG: Status post recent admission with non-STEMI treated with DES 2 to the native LAD and DES to SVG-OM:    For now he will continue with risk reduction.     Ischemic Cardiomyopathy: EF 25-30% by recent echocardiogram but improved as above.  He cannot tolerate med titration.  He is euvolemic.  He will continue the meds as listed.  CKD:  The patient is off of ARB secondary to this.  I will defer follow up to Shirline Frees, MD  Aortic Insufficiency: Moderate by recent echocardiogram. This will need to be followed clinically as well as with serial echocardiograms over time.  I reviewed  results of the Jan echo  Mitral Regurgitation: Mild on echocardiogram.  HTN: This is being managed in the context of treating his CHF.  It is low now.   Hyperlipidemia: Not on statin therapy secondary to elevated LFTs and CPK in the past. Previously we discussed referral to lipid clinic for consideration of PCSK 9 therapy today. He was supposed to be on Zetia but has not been complaint with this.    Carotid stenosis:   He had this today and I will follow uop.    Current medicines are reviewed at  length with the patient today.  The patient does not have concerns regarding medicines.  The following changes have been made:  As above  Labs/ tests ordered today include:   No orders of the defined types were placed in this encounter.    Disposition:   FU with me in six months.     Signed, [name], MD  12/19/2015 12:55 PM     [facility]

## 2015-12-19 ENCOUNTER — Ambulatory Visit (INDEPENDENT_AMBULATORY_CARE_PROVIDER_SITE_OTHER): Payer: Medicare Other | Admitting: Cardiology

## 2015-12-19 ENCOUNTER — Ambulatory Visit (HOSPITAL_COMMUNITY)
Admission: RE | Admit: 2015-12-19 | Discharge: 2015-12-19 | Disposition: A | Discharge status: Home / Self Care | Payer: Medicare Other | Source: Ambulatory Visit | Attending: Physician Assistant | Admitting: Physician Assistant

## 2015-12-19 ENCOUNTER — Encounter: Payer: Self-pay | Admitting: Cardiology

## 2015-12-19 VITALS — BP 108/52 | HR 53 | Ht 66.0 in | Wt 144.8 lb

## 2015-12-19 DIAGNOSIS — I255 Ischemic cardiomyopathy: Secondary | ICD-10-CM | POA: Diagnosis not present

## 2015-12-19 DIAGNOSIS — I10 Essential (primary) hypertension: Secondary | ICD-10-CM | POA: Diagnosis not present

## 2015-12-19 DIAGNOSIS — I2510 Atherosclerotic heart disease of native coronary artery without angina pectoris: Secondary | ICD-10-CM | POA: Insufficient documentation

## 2015-12-19 DIAGNOSIS — I6523 Occlusion and stenosis of bilateral carotid arteries: Secondary | ICD-10-CM | POA: Insufficient documentation

## 2015-12-19 DIAGNOSIS — E785 Hyperlipidemia, unspecified: Secondary | ICD-10-CM | POA: Diagnosis not present

## 2015-12-19 NOTE — Patient Instructions (Signed)
Medication Instructions:  Continue current medications  Labwork: NONE  Testing/Procedures: NONE  Follow-Up: Your physician wants you to follow-up in: 6 Months. You will receive a reminder letter in the mail two months in advance. If you don't receive a letter, please call our office to schedule the follow-up appointment.   Any Other Special Instructions Will Be Listed Below (If Applicable).   If you need a refill on your cardiac medications before your next appointment, please call your pharmacy.   

## 2015-12-21 ENCOUNTER — Telehealth: Payer: Self-pay | Admitting: *Deleted

## 2015-12-21 DIAGNOSIS — I6523 Occlusion and stenosis of bilateral carotid arteries: Secondary | ICD-10-CM

## 2015-12-21 NOTE — Telephone Encounter (Signed)
DPR  for wife who has been notified of carotid results and findings for pt. She is agreeable to plan of care for the pt.

## 2016-08-01 ENCOUNTER — Telehealth: Payer: Self-pay | Admitting: Physician Assistant

## 2016-08-01 DIAGNOSIS — I110 Hypertensive heart disease with heart failure: Secondary | ICD-10-CM

## 2016-08-01 DIAGNOSIS — I2510 Atherosclerotic heart disease of native coronary artery without angina pectoris: Secondary | ICD-10-CM

## 2016-08-01 DIAGNOSIS — N183 Chronic kidney disease, stage 3 unspecified: Secondary | ICD-10-CM

## 2016-08-01 NOTE — Telephone Encounter (Signed)
[id number] 

## 2016-08-01 NOTE — Telephone Encounter (Signed)
**Note De-identified  Obfuscation** This is Dr. Hochrein's pt. °

## 2016-08-01 NOTE — Telephone Encounter (Signed)
Spoke with wife she states that pt went to PCP last week and received an inhaler and c/o SOB, cough and congestion. PCP states that it was either allergies or a cold so gave pt clarain and an inhaler. She states that she just wanted to let let scott know that he saw PCP and was sick

## 2016-08-01 NOTE — Telephone Encounter (Signed)
Follow up    Pt wife returning Walker call

## 2016-08-01 NOTE — Telephone Encounter (Signed)
New Message      Pt has appt for Monday 3/26 245p  Pt c/o Shortness Of Breath: STAT if SOB developed within the last 24 hours or pt is noticeably SOB on the phone  1. Are you currently SOB (can you hear that pt is SOB on the phone)? Wife on phone  2. How long have you been experiencing SOB? Since weather started changing   3. Are you SOB when sitting or when up moving around? Both   4. Are you currently experiencing any other symptoms? no   Pt is also on inhaler and 2 claritin a day

## 2016-08-04 ENCOUNTER — Ambulatory Visit (INDEPENDENT_AMBULATORY_CARE_PROVIDER_SITE_OTHER): Payer: Medicare Other | Admitting: Physician Assistant

## 2016-08-04 ENCOUNTER — Encounter: Payer: Self-pay | Admitting: Physician Assistant

## 2016-08-04 VITALS — BP 128/50 | HR 79 | Ht 66.0 in | Wt 144.0 lb

## 2016-08-04 DIAGNOSIS — I5042 Chronic combined systolic (congestive) and diastolic (congestive) heart failure: Secondary | ICD-10-CM | POA: Diagnosis not present

## 2016-08-04 DIAGNOSIS — I2510 Atherosclerotic heart disease of native coronary artery without angina pectoris: Secondary | ICD-10-CM

## 2016-08-04 DIAGNOSIS — I110 Hypertensive heart disease with heart failure: Secondary | ICD-10-CM

## 2016-08-04 DIAGNOSIS — I739 Peripheral vascular disease, unspecified: Secondary | ICD-10-CM

## 2016-08-04 DIAGNOSIS — R0602 Shortness of breath: Secondary | ICD-10-CM | POA: Diagnosis not present

## 2016-08-04 DIAGNOSIS — I351 Nonrheumatic aortic (valve) insufficiency: Secondary | ICD-10-CM | POA: Diagnosis not present

## 2016-08-04 DIAGNOSIS — I779 Disorder of arteries and arterioles, unspecified: Secondary | ICD-10-CM

## 2016-08-04 DIAGNOSIS — N183 Chronic kidney disease, stage 3 unspecified: Secondary | ICD-10-CM

## 2016-08-04 MED ORDER — ISOSORBIDE MONONITRATE ER 30 MG PO TB24
15.0000 mg | ORAL_TABLET | Freq: Every day | ORAL | 3 refills | Status: DC
Start: 2016-08-04 — End: 2016-08-04

## 2016-08-04 MED ORDER — ISOSORBIDE MONONITRATE ER 30 MG PO TB24: 15.0000 mg | ORAL_TABLET | Freq: Every day | ORAL | 3 refills | Status: DC

## 2016-08-04 NOTE — Telephone Encounter (Signed)
See office note from today. [name], PA-C   08/04/2016 10:04 PM

## 2016-08-04 NOTE — Progress Notes (Signed)
Cardiology Office Note:    Date:  08/04/2016   ID:  [name], DOB July 29, 1934, MRN [id number]  PCP:  Shirline Frees, MD  Cardiologist:  Dr. [name]   Electrophysiologist:  n/a  Referring MD: Shirline Frees, MD   Chief Complaint  Patient presents with  . Shortness of Breath    History of Present Illness:    [name] is a 82 y.o. male with a hx of CAD, s/p CABG in 2003, aortic insufficiency, HTN, HL, carotid artery disease. He has been nonadherent to follow-up and medications in the past. He has a history of elevated LFTs and total CK on statin therapy in the past.  Admitted 9/16 with a non-STEMI. LHC demonstrated severe 3 vessel CAD with a chronically occluded LCx, RCA and severe subtotal occlusion of the native mid LAD, chronically occluded SVG-RCA and SVG-LAD with severe stenosis and SVG-OM. He underwent PCI with DES 2 in overlapping fashion to the native LAD as well as DES to the SVG-OM. Echocardiogram demonstrated an EF of 25-30%, mild AS, moderate AI and mild MR.  FU echo in 1/17 demonstrated improved EF 40-45%. Because of epistaxis, we changed his Brilinta to Plavix.    He was last seen by Dr. Percival Spanish 12/19/15.    He is here with his wife.  He complains of increasing shortness of breath recently.  Of note he ran out of his Imdur 2 months ago and his breathing has been worse over the past 2 mos.  He denies chest pain.  He denies orthopnea, PND, weight gain. He does have LE edema.  This is fairly stable.  He denies syncope, dizziness.  He denies bleeding issues.  He saw his PCP for cough and congestion.  He was put on H1RA and albuterol.  His sputum is white.  He denies hemoptysis.    Prior CV studies:   The following studies were reviewed today:  Carotid US 9/73:  RICA 53-29%; LICA 9-24% >> FU 1 year  Echo 05/31/15 Mild LVH, EF 40-45%, inf-septal HK, apical inf-septal AK, Gr 1 DD, mild AI, Aortic Sclerosis without stenosis (mean 15 mmHg), mod MAC, mild to mod  reduced RVSF  Echo 01/28/15 Inferoseptal HK, inferior HK, apical AK, anteroseptal HK, moderate LVH, EF 26-83% grade 2 diastolic dysfunction, mild aortic stenosis (mean 9 mmHg), moderate AI, mildly dilated aortic root, ascending aortic diameter 40 mm (mildly dilated), mild MR, mild LAE, mild PI, PASP 36 mmHg  LHC 01/27/15 LM: 40% LAD: Mid 99%, 99% >> Synergy DES x 2 (overlapping) LCx: 100% RCA: 100% SVG-LAD: 100% SVG-OM2 90% >> Synergy DES SVG-RCA 100% EF 25-35%, 4+ MR 1. Severe three-vessel coronary artery disease with chronically occluded left circumflex and right coronary artery. Severe subtotal occlusion in the mid LAD. 2. Chronically occluded SVG to RCA (with left-to-right collaterals) and SVG to LAD. Severe 90% stenosis in the SVG to OM. 3. Severely reduced LV systolic function with segmental wall motion abnormalities as outlined above and severe mitral regurgitation. 4. Severely elevated left ventricular end-diastolic pressure. 5. Successful angioplasty and drug-eluting stent placement to the LAD 2 in an overlapped fashion and 1 drug-eluting stent placement to SVG to OM2.  Abd Aorta US 09/19/13 No AAA  Carotid US 09/19/13 Bilateral ICA 1-39% FU 2 years (due in 09/2015)  Past Medical History:  Diagnosis Date  . CAD (coronary artery disease)    a. CABG (2003)  b. 9/17/6: NSTEMI s/p overlapping DES x2 to LAD and DESx1 to SVG-->OM   . Carotid  artery disease ([place])    a. 09/2013: carotid dopplers with stable 1-39% ICA stenosis bilaterally  //  b. Carotid US 7/82: RICA 42-35%; LICA 3-61% >> FU 1 year  . Dementia   . Hyperlipidemia   . Hypertension   . Internal hemorrhoids   . Ischemic cardiomyopathy    a. 2D ECHO 01/28/15 w/ EF 25-30%, severe HK of the basal-midinferoseptal myocardium, basal-midinferior myocardium and midanteroseptal myocardium. Akinesis of apical inferior and apicalanterior myocardium. G2DD. Mild AS, Asc aorta diameter 7m. Mild dilation of aortic root. Mild MR,  mild PR and PA peak pressure 36 mmg HG.  . Peptic ulcer disease   . Prostate CA ([facility]    a. s/p seed implants    Past Surgical History:  Procedure Laterality Date  . CARDIAC CATHETERIZATION N/A 01/27/2015   Procedure: Left Heart Cath and Coronary Angiography;  Surgeon: MWellington Hampshire MD;  [facility];  Service: Cardiovascular;  Laterality: N/A;  . CARDIAC SURGERY  2003   SVG LAD, SVG to right coronary artery, SVG to marginal    Current Medications: Current Meds  Medication Sig  . aspirin EC 81 MG tablet Take 1 tablet (81 mg total) by mouth daily.  . carvedilol (COREG) 6.25 MG tablet Take 1 tablet (6.25 mg total) by mouth 2 (two) times daily with a meal. Reported on 06/20/2015  . clopidogrel (PLAVIX) 75 MG tablet Take 1 tablet (75 mg total) by mouth daily.  . furosemide (LASIX) 20 MG tablet Take 1 tablet (20 mg total) by mouth daily. 3/17 ON HOLD  . nitroGLYCERIN (NITROSTAT) 0.4 MG SL tablet Place 1 tablet (0.4 mg total) under the tongue every 5 (five) minutes x 3 doses as needed for chest pain.  .Marland KitchenPROAIR HFA 108 (90 Base) MCG/ACT inhaler inhale 2 puffs every 6 hours  . [DISCONTINUED] isosorbide mononitrate (IMDUR) 30 MG 24 hr tablet Take 1 tablet (30 mg total) by mouth daily.     Allergies:   Ramipril and Latex   Social History   Social History  . Marital status: Married    Spouse name: N/A  . Number of children: N/A  . Years of education: N/A   Occupational History  . Retired    Social History Main Topics  . Smoking status: Former SResearch scientist (life sciences) . Smokeless tobacco: Never Used  . Alcohol use No  . Drug use: Unknown  . Sexual activity: Not Asked   Other Topics Concern  . None   Social History Narrative  . None     Family History  Problem Relation Age of Onset  . Stroke Father      ROS:   Please see the history of present illness.    Review of Systems  Constitution: Positive for malaise/fatigue.  Cardiovascular: Negative for chest pain.    Respiratory: Positive for cough (white sputum - no hemoptysis). Negative for wheezing.    All other systems reviewed and are negative.   EKGs/Labs/Other Test Reviewed:    EKG:  EKG is  ordered today.  The ekg ordered today demonstrates NSR, HR 79, leftward axis, first degree AV block, PR 220 ms, QTC 435 ms, PVC, no significant change since prior tracing  Recent Labs: 08/28/2015: B Natriuretic Peptide 1,063.8; Hemoglobin 12.2; Platelets 178 09/17/2015: BUN 21; Creat 1.51; Potassium 4.7; Sodium 140   Recent Lipid Panel    Component Value Date/Time   CHOL 177 07/27/2015 0845   TRIG 117 07/27/2015 0845   HDL 26 (L) 07/27/2015 0845  CHOLHDL 6.8 (H) 07/27/2015 0845   VLDL 23 07/27/2015 0845   LDLCALC 128 07/27/2015 0845    Physical Exam:    VS:  BP (!) 128/50   Pulse 79   Ht 5' 6"  (1.676 m)   Wt 144 lb (65.3 kg)   BMI 23.24 kg/m     Wt Readings from Last 3 Encounters:  08/04/16 144 lb (65.3 kg)  12/19/15 144 lb 12.8 oz (65.7 kg)  09/17/15 144 lb 12.8 oz (65.7 kg)     Physical Exam  Constitutional: He is oriented to person, place, and time. He appears well-developed and well-nourished. No distress.  HENT:  Head: Normocephalic and atraumatic.  Eyes: No scleral icterus.  Neck: Normal range of motion. No JVD present.  Cardiovascular: Normal rate, regular rhythm, S1 normal and S2 normal.   Murmur heard.  Decrescendo diastolic murmur is present with a grade of 2/6  at the lower left sternal border Pulmonary/Chest: Effort normal and breath sounds normal. He has no wheezes. He has no rhonchi. He has no rales.  Abdominal: Soft. There is no tenderness.  Musculoskeletal: He exhibits edema (trace to 1+ bilateral ankle edema).  Neurological: He is alert and oriented to person, place, and time.  Skin: Skin is warm and dry.  Psychiatric: He has a normal mood and affect.    ASSESSMENT:    1. Shortness of breath   2. Coronary artery disease involving native coronary artery of  native heart without angina pectoris   3. Chronic combined systolic and diastolic CHF (congestive heart failure) ([place])   4. Aortic valve insufficiency, etiology of cardiac valve disease unspecified   5. Hypertensive heart disease with CHF (congestive heart failure) ([place])   6. Bilateral carotid artery disease ([place])   7. CKD (chronic kidney disease) stage 3, GFR 30-59 ml/min    PLAN:    In order of problems listed above:  1. Shortness of breath -  He notes increasing shortness of breath.  It is difficult to know how much this is different from his baseline.  He was on Isosorbide and seems to feel worse since he has been off of this.  He does not look particularly volume overloaded, but his legs are swollen.  It is possible he has some mild volume overload contributing.  He is not having any chest pain to suggest angina.  He does complain of feeling weak.      -  Obtain follow up Echo to recheck EF and reassess AI  -  BMET, BNP, CBC  -  Will resume Imdur 15 mg QD  -  Increase Lasix to 40 mg QD x 2 days, then resume 20 mg QD.  -  Follow-up in 6-8 weeks  2. Coronary artery disease involving native coronary artery of native heart without angina pectoris -  s/p CABG. S/p non-STEMI treated with DES 2 to the native LAD and DES to SVG-OM 9/16. He does have a chronically occluded RCA and occluded SVG-RCA.He denies anginal symptoms. He does note increased shortness of breath. I will start with an echocardiogram. If symptoms progress, we may need to consider stress testing  3. Chronic combined systolic and diastolic CHF (congestive heart failure) ([place]) -  Secondary to ischemic cardiomyopathy with EF 40-45. Continue beta blocker, isosorbide. He is no longer on ARB secondary to worsening renal function. Repeat echocardiogram as noted. Adjust Lasix as noted. Obtain BMET, BNP today.  4. Aortic valve insufficiency, etiology of cardiac valve disease unspecified -  This has  been mild to moderate in the  past. Plan repeat echocardiogram to reassess aortic insufficiency given his current symptoms.  5. Hypertensive heart disease with CHF (congestive heart failure) ([facility]) - Blood pressure is controlled.  6. Bilateral carotid artery disease ([facility]) -  Mild to moderate bilateral ICA stenosis by carotid US in 5/17. Repeat is due in 5/18.  7. CKD (chronic kidney disease) stage 3, GFR 30-59 ml/min -    -  Plan: Basic Metabolic Panel (BMET)   Dispo:  Return in about 8 weeks (around 09/29/2016) for Close Follow Up, w/ [name], PA-C.   Medication Adjustments/Labs and Tests Ordered: Current medicines are reviewed at length with the patient today.  Concerns regarding medicines are outlined above.  Medication changes, Labs and Tests ordered today are outlined in the Patient Instructions noted below. Patient Instructions  Medication Instructions:  1. START IMDUR 30 MG TABLET WITH THE DIRECTIONS ON THE BOTTLE TO TAKE 1/2 TABLET DAILY = 15 MG DAILY 2. INCREASE LASIX TO 40 MG DAILY FOR 2 DAYS; AFTER THE 2 DAYS GO BACK TO TAKING LASIX 20 MG DAILY   Labwork: TODAY BMET, CBC, PRO BNP  Testing/Procedures: Your physician has requested that you have an echocardiogram. Echocardiography is a painless test that uses sound waves to create images of your heart. It provides your doctor with information about the size and shape of your heart and how well your heart's chambers and valves are working. This procedure takes approximately one hour. There are no restrictions for this procedure.  Follow-Up: [name], PAC 6-8 WEEKS   Any Other Special Instructions Will Be Listed Below (If Applicable).  If you need a refill on your cardiac medications before your next appointment, please call your pharmacy.  Signed, [name], PA-C  08/04/2016 4:55 PM    [facility] [place], [place], [place]  [address] Phone: [phone]; Fax: [phone]

## 2016-08-04 NOTE — Patient Instructions (Addendum)
Medication Instructions:  1. START IMDUR 30 MG TABLET WITH THE DIRECTIONS ON THE BOTTLE TO TAKE 1/2 TABLET DAILY = 15 MG DAILY 2. INCREASE LASIX TO 40 MG DAILY FOR 2 DAYS; AFTER THE 2 DAYS GO BACK TO TAKING LASIX 20 MG DAILY   Labwork: TODAY BMET, CBC, PRO BNP  Testing/Procedures: Your physician has requested that you have an echocardiogram. Echocardiography is a painless test that uses sound waves to create images of your heart. It provides your doctor with information about the size and shape of your heart and how well your heart's chambers and valves are working. This procedure takes approximately one hour. There are no restrictions for this procedure.  Follow-Up: SCOTT WEAVER, PAC 6-8 WEEKS   Any Other Special Instructions Will Be Listed Below (If Applicable).  If you need a refill on your cardiac medications before your next appointment, please call your pharmacy.

## 2016-08-05 ENCOUNTER — Telehealth: Payer: Self-pay | Admitting: *Deleted

## 2016-08-05 DIAGNOSIS — N183 Chronic kidney disease, stage 3 unspecified: Secondary | ICD-10-CM

## 2016-08-05 DIAGNOSIS — I110 Hypertensive heart disease with heart failure: Secondary | ICD-10-CM

## 2016-08-05 LAB — BASIC METABOLIC PANEL
BUN/Creatinine Ratio: 21 (ref 10–24)
BUN: 31 mg/dL — AB (ref 8–27)
CALCIUM: 9 mg/dL (ref 8.6–10.2)
CO2: 22 mmol/L (ref 18–29)
Chloride: 100 mmol/L (ref 96–106)
Creatinine, Ser: 1.51 mg/dL — ABNORMAL HIGH (ref 0.76–1.27)
GFR calc non Af Amer: 43 mL/min/{1.73_m2} — ABNORMAL LOW (ref >59)
GFR, EST AFRICAN AMERICAN: 50 mL/min/{1.73_m2} — AB (ref >59)
Glucose: 92 mg/dL (ref 65–99)
POTASSIUM: 3.8 mmol/L (ref 3.5–5.2)
Sodium: 140 mmol/L (ref 134–144)

## 2016-08-05 LAB — CBC
HEMATOCRIT: 37.7 % (ref 37.5–51.0)
HEMOGLOBIN: 11.9 g/dL — AB (ref 13.0–17.7)
MCH: 27.6 pg (ref 26.6–33.0)
MCHC: 31.6 g/dL (ref 31.5–35.7)
MCV: 88 fL (ref 79–97)
Platelets: 167 10*3/uL (ref 150–379)
RBC: 4.31 x10E6/uL (ref 4.14–5.80)
RDW: 16.4 % — ABNORMAL HIGH (ref 12.3–15.4)
WBC: 4.6 10*3/uL (ref 3.4–10.8)

## 2016-08-05 LAB — PRO B NATRIURETIC PEPTIDE: NT-PRO BNP: 15458 pg/mL — AB (ref 0–486)

## 2016-08-05 NOTE — Telephone Encounter (Signed)
DPR for pt's wife . No answer on the home # or wife's cell #. Tried to reach pt to go over lab results and recommendations. I will try to reach pt later today.

## 2016-08-06 NOTE — Telephone Encounter (Signed)
Lmtcb for lab results 

## 2016-08-06 NOTE — Telephone Encounter (Signed)
-----   Message from Liliane Shi, [place] sent at 08/05/2016  7:54 AM EDT ----- Please call the patient Kidney function is stable. The hemoglobin is stable. The BNP is elevated. Continue on increased Lasix 40 mg QD (do not reduce back to 20 mg as directed at [place] yesterday). Repeat BMET, BNP 1 week. Arrange follow up with me in 2 weeks. [name], PA-C   08/05/2016 7:54 AM

## 2016-08-06 NOTE — Telephone Encounter (Signed)
Lmtcb to go over lab results and recommendations.  

## 2016-08-08 ENCOUNTER — Encounter: Payer: Self-pay | Admitting: *Deleted

## 2016-08-08 MED ORDER — FUROSEMIDE 40 MG PO TABS: 40.0000 mg | ORAL_TABLET | Freq: Every day | ORAL | 3 refills | Status: DC

## 2016-08-08 NOTE — Telephone Encounter (Signed)
-----   Message from Liliane Shi, [place] sent at 08/05/2016  7:54 AM EDT ----- Please call the patient Kidney function is stable. The hemoglobin is stable. The BNP is elevated. Continue on increased Lasix 40 mg QD (do not reduce back to 20 mg as directed at [place] yesterday). Repeat BMET, BNP 1 week. Arrange follow up with me in 2 weeks. [name], PA-C   08/05/2016 7:54 AM

## 2016-08-08 NOTE — Telephone Encounter (Signed)
Tried to reach pt again to go over lab results and recommendations, no answer. I will send out letter to pt today.

## 2016-08-14 MED ORDER — FUROSEMIDE 20 MG PO TABS: 20.0000 mg | ORAL_TABLET | Freq: Every day | ORAL | 3 refills | Status: DC

## 2016-08-14 NOTE — Addendum Note (Signed)
Addended by: Michae Kava on: 08/14/2016 02:48 PM   Modules accepted: Orders

## 2016-08-14 NOTE — Telephone Encounter (Signed)
-----   Message from Liliane Shi, [place] sent at 08/05/2016  7:54 AM EDT ----- Please call the patient Kidney function is stable. The hemoglobin is stable. The BNP is elevated. Continue on increased Lasix 40 mg QD (do not reduce back to 20 mg as directed at [place] yesterday). Repeat BMET, BNP 1 week. Arrange follow up with me in 2 weeks. [name], PA-C   08/05/2016 7:54 AM

## 2016-08-14 NOTE — Telephone Encounter (Signed)
DPR for pt's wife who I reached today on their home #. S/w pt's wife about lab results and recommendations. Wife aware to have pt continue Lasix 40 mg daily, bmet, bnp in 1 week (April 11 when he comes in for his echo), f/u with Brynda Rim. PA sooner than May appt , Pt scheduled to see Nicki Reaper W.PA 4/25 @ 8:45. Wife cell phone temporarily disconnected. I advised wife that letter was mailed since I could not reach them. Advised just disregard letter. Wife thanked me for my call and time today.

## 2016-08-15 ENCOUNTER — Encounter: Payer: Self-pay | Admitting: Physician Assistant

## 2016-08-20 ENCOUNTER — Other Ambulatory Visit: Payer: Medicare Other | Admitting: *Deleted

## 2016-08-20 ENCOUNTER — Other Ambulatory Visit: Payer: Self-pay

## 2016-08-20 ENCOUNTER — Ambulatory Visit (HOSPITAL_COMMUNITY): Payer: Medicare Other | Attending: Cardiology

## 2016-08-20 ENCOUNTER — Encounter: Payer: Self-pay | Admitting: Physician Assistant

## 2016-08-20 ENCOUNTER — Telehealth: Payer: Self-pay | Admitting: *Deleted

## 2016-08-20 DIAGNOSIS — I083 Combined rheumatic disorders of mitral, aortic and tricuspid valves: Secondary | ICD-10-CM | POA: Insufficient documentation

## 2016-08-20 DIAGNOSIS — I351 Nonrheumatic aortic (valve) insufficiency: Secondary | ICD-10-CM

## 2016-08-20 DIAGNOSIS — I509 Heart failure, unspecified: Secondary | ICD-10-CM | POA: Insufficient documentation

## 2016-08-20 DIAGNOSIS — I110 Hypertensive heart disease with heart failure: Secondary | ICD-10-CM | POA: Diagnosis not present

## 2016-08-20 DIAGNOSIS — E785 Hyperlipidemia, unspecified: Secondary | ICD-10-CM | POA: Diagnosis not present

## 2016-08-20 DIAGNOSIS — I255 Ischemic cardiomyopathy: Secondary | ICD-10-CM | POA: Insufficient documentation

## 2016-08-20 DIAGNOSIS — I2510 Atherosclerotic heart disease of native coronary artery without angina pectoris: Secondary | ICD-10-CM | POA: Diagnosis not present

## 2016-08-20 DIAGNOSIS — N183 Chronic kidney disease, stage 3 unspecified: Secondary | ICD-10-CM

## 2016-08-20 DIAGNOSIS — I5042 Chronic combined systolic (congestive) and diastolic (congestive) heart failure: Secondary | ICD-10-CM

## 2016-08-20 NOTE — Telephone Encounter (Signed)
-----   Message from Liliane Shi, [place] sent at 08/20/2016  5:02 PM EDT ----- Please call the patient. His echo shows fairly stable ejection fraction (was 40-45 - now 35-40); mod leakage of aortic valve and mod leakage of mitral valve, mild to mod increased pulmonary pressures. Overall findings are fairly stable. Continue current treatment plan. Keep follow up with me in 2 weeks as planned.  Please fax a copy of this study result to his PCP:  Shirline Frees, MD  Thanks! [name], PA-C    08/20/2016 4:57 PM

## 2016-08-20 NOTE — Telephone Encounter (Signed)
Tried to reach pt to go over echo results, no answer.

## 2016-08-21 LAB — PRO B NATRIURETIC PEPTIDE: NT-PRO BNP: 6789 pg/mL — AB (ref 0–486)

## 2016-08-21 LAB — BASIC METABOLIC PANEL
BUN/Creatinine Ratio: 26 — ABNORMAL HIGH (ref 10–24)
BUN: 36 mg/dL — ABNORMAL HIGH (ref 8–27)
CALCIUM: 9.1 mg/dL (ref 8.6–10.2)
CHLORIDE: 99 mmol/L (ref 96–106)
CO2: 26 mmol/L (ref 18–29)
Creatinine, Ser: 1.41 mg/dL — ABNORMAL HIGH (ref 0.76–1.27)
GFR calc Af Amer: 54 mL/min/{1.73_m2} — ABNORMAL LOW (ref >59)
GFR calc non Af Amer: 47 mL/min/{1.73_m2} — ABNORMAL LOW (ref >59)
Glucose: 127 mg/dL — ABNORMAL HIGH (ref 65–99)
POTASSIUM: 3.8 mmol/L (ref 3.5–5.2)
Sodium: 142 mmol/L (ref 134–144)

## 2016-08-27 ENCOUNTER — Telehealth: Payer: Self-pay | Admitting: *Deleted

## 2016-08-27 NOTE — Telephone Encounter (Signed)
-----   Message from Liliane Shi, [place] sent at 08/20/2016  5:02 PM EDT ----- Please call the patient. His echo shows fairly stable ejection fraction (was 40-45 - now 35-40); mod leakage of aortic valve and mod leakage of mitral valve, mild to mod increased pulmonary pressures. Overall findings are fairly stable. Continue current treatment plan. Keep follow up with me in 2 weeks as planned.  Please fax a copy of this study result to his PCP:  Shirline Frees, MD  Thanks! [name], PA-C    08/20/2016 4:57 PM

## 2016-08-27 NOTE — Telephone Encounter (Signed)
Tried to reach pt again to go over echo results. Pt does have appt coming up with Scott W. PA 09/03/16.

## 2016-09-02 ENCOUNTER — Telehealth: Payer: Self-pay | Admitting: *Deleted

## 2016-09-02 NOTE — Telephone Encounter (Signed)
-----   Message from Liliane Shi, [place] sent at 08/20/2016  5:02 PM EDT ----- Please call the patient. His echo shows fairly stable ejection fraction (was 40-45 - now 35-40); mod leakage of aortic valve and mod leakage of mitral valve, mild to mod increased pulmonary pressures. Overall findings are fairly stable. Continue current treatment plan. Keep follow up with me in 2 weeks as planned.  Please fax a copy of this study result to his PCP:  Shirline Frees, MD  Thanks! [name], PA-C    08/20/2016 4:57 PM

## 2016-09-02 NOTE — Telephone Encounter (Signed)
DPR for pt's wife who has been advised of echo results and findings by phone with verbal understanding. Also  pt had multiple appt's scheduled. One for tomorrow 4/25 and again on 5/7. We changed appt from tomorrow 4/25 to 4/27 due to scheduling conflict. I will also cancel the appt for 09/15/16 based on the request provider will be out of the office that day. Pt's wife verbalized understanding to plan of care for the pt.

## 2016-09-03 ENCOUNTER — Ambulatory Visit: Payer: Medicare Other | Admitting: Physician Assistant

## 2016-09-04 DIAGNOSIS — I5043 Acute on chronic combined systolic (congestive) and diastolic (congestive) heart failure: Secondary | ICD-10-CM | POA: Insufficient documentation

## 2016-09-04 DIAGNOSIS — I5042 Chronic combined systolic (congestive) and diastolic (congestive) heart failure: Secondary | ICD-10-CM

## 2016-09-04 NOTE — Progress Notes (Signed)
Cardiology Office Note:    Date:  09/05/2016   ID:  [name], DOB 04/08/1935, MRN [id number]  PCP:  Shirline Frees, MD  Cardiologist:  Dr. [name]  >> prefers to remain at [facility] (will est with Dr. Harrell Gave End and [name], PA-C) Electrophysiologist:  n/a  Referring MD: Shirline Frees, MD   Chief Complaint  Patient presents with  . Congestive Heart Failure    follow up    History of Present Illness:    [name] is a 82 y.o. male with a hx of CAD, s/p CABG in 2003, aortic insufficiency, HTN, HL, carotid artery disease. He has been nonadherent to follow-up and medications in the past. He has a history of elevated LFTs and total CK on statin therapy in the past.  Admitted 9/16 with a non-STEMI. LHC demonstrated severe 3 vessel CAD with a chronically occluded LCx, RCA and severe subtotal occlusion of the native mid LAD, chronically occluded SVG-RCA and SVG-LAD with severe stenosis and SVG-OM. He underwent PCI with DES 2 in overlapping fashion to the native LAD as well as DES to the SVG-OM. Echocardiogram demonstrated an EF of 25-30%, mild AS, moderate AI and mild MR. FU echo in 1/17 demonstrated improved EF 40-45%. Because of epistaxis, we changed his Brilinta to Plavix.   Last seen 08/04/16 with increasing shortness of breath, LE edema and cough/congestion.  He was off Imdur and I resumed this and increased his Lasix.  Pro-BNP was 15,458 and I kept him on the higher dose of Lasix. Echo showed stable ejection fraction (35-40) and stable AI/MR.  He returns for follow up.    He is here today with his wife. Since last seen, he is doing better. He denies significant dyspnea. He denies chest pain. He denies orthopnea, PND. LE edema is improved. Denies syncope.   Prior CV studies:   The following studies were reviewed today:  Echo 08/20/16 Mild conc LVH, EF 35-40, ant-sept and inf-sept HK, Gr 2 DD, mild AS (mean 13), mod AI, mild dilated asc aorta (43 mm), MAC,  mod MR, severe LAE, mild TR, mod PI, PASP 43  Carotid US 1/24:  RICA 58-09%; LICA 9-83% >> FU 1 year  Echo 05/31/15 Mild LVH, EF 40-45%, inf-septal HK, apical inf-septal AK, Gr 1 DD, mild AI, Aortic Sclerosis without stenosis (mean 15 mmHg), mod MAC, mild to mod reduced RVSF  Echo 01/28/15 Inferoseptal HK, inferior HK, apical AK, anteroseptal HK, moderate LVH, EF 38-25% grade 2 diastolic dysfunction, mild aortic stenosis (mean 9 mmHg), moderate AI, mildly dilated aortic root, ascending aortic diameter 40 mm (mildly dilated), mild MR, mild LAE, mild PI, PASP 36 mmHg  LHC 01/27/15 LM: 40% LAD: Mid 99%, 99% >> Synergy DES x 2 (overlapping) LCx: 100% RCA: 100% SVG-LAD: 100% SVG-OM2 90% >> Synergy DES SVG-RCA 100% EF 25-35%, 4+ MR 1. Severe three-vessel coronary artery disease with chronically occluded left circumflex and right coronary artery. Severe subtotal occlusion in the mid LAD. 2. Chronically occluded SVG to RCA (with left-to-right collaterals) and SVG to LAD. Severe 90% stenosis in the SVG to OM. 3. Severely reduced LV systolic function with segmental wall motion abnormalities as outlined above and severe mitral regurgitation. 4. Severely elevated left ventricular end-diastolic pressure. 5. Successful angioplasty and drug-eluting stent placement to the LAD 2 in an overlapped fashion and 1 drug-eluting stent placement to SVG to OM2.  Abd Aorta US 09/19/13 No AAA  Carotid US 09/19/13 Bilateral ICA 1-39% FU 2 years (due in  09/2015)  Past Medical History:  Diagnosis Date  . CAD (coronary artery disease)    a. CABG (2003)  b. 9/17/6: NSTEMI s/p overlapping DES x2 to LAD and DESx1 to SVG-->OM   . Carotid artery disease ([place])    a. 09/2013: carotid dopplers with stable 1-39% ICA stenosis bilaterally  //  b. Carotid US 6/14: RICA 43-15%; LICA 4-00% >> FU 1 year  . Dementia   . History of echocardiogram    Echo 4/18: mild conc LVH, EF 35-40, diff HK, anteroseptal and inf-septal HK,  Gr 2 DD, mild AS (mean 13), mod AI, mildly dilated asc Ao (43 mm), mod MAC, mod MR, severe LAE, mild TR, mod PI, PASP 43  . Hyperlipidemia   . Hypertension   . Internal hemorrhoids   . Ischemic cardiomyopathy    a. 2D ECHO 01/28/15 w/ EF 25-30%, severe HK of the basal-midinferoseptal myocardium, basal-midinferior myocardium and midanteroseptal myocardium. Akinesis of apical inferior and apicalanterior myocardium. G2DD. Mild AS, Asc aorta diameter 63m. Mild dilation of aortic root. Mild MR, mild PR and PA peak pressure 36 mmg HG.  . Peptic ulcer disease   . Prostate CA ([facility]    a. s/p seed implants    Past Surgical History:  Procedure Laterality Date  . CARDIAC CATHETERIZATION N/A 01/27/2015   Procedure: Left Heart Cath and Coronary Angiography;  Surgeon: MWellington Hampshire MD;  [facility];  Service: Cardiovascular;  Laterality: N/A;  . CARDIAC SURGERY  2003   SVG LAD, SVG to right coronary artery, SVG to marginal    Current Medications: Current Meds  Medication Sig  . aspirin EC 81 MG tablet Take 1 tablet (81 mg total) by mouth daily.  . carvedilol (COREG) 6.25 MG tablet Take 1 tablet (6.25 mg total) by mouth 2 (two) times daily with a meal. Reported on 06/20/2015  . clopidogrel (PLAVIX) 75 MG tablet Take 1 tablet (75 mg total) by mouth daily.  . furosemide (LASIX) 40 MG tablet Take 40 mg by mouth daily.  . isosorbide mononitrate (IMDUR) 30 MG 24 hr tablet Take 0.5 tablets (15 mg total) by mouth daily.  . nitroGLYCERIN (NITROSTAT) 0.4 MG SL tablet Place 1 tablet (0.4 mg total) under the tongue every 5 (five) minutes x 3 doses as needed for chest pain.  .Marland KitchenPROAIR HFA 108 (90 Base) MCG/ACT inhaler inhale 2 puffs every 6 hours  . [DISCONTINUED] furosemide (LASIX) 20 MG tablet Take 1 tablet (20 mg total) by mouth daily.     Allergies:   Ramipril and Latex   Social History   Social History  . Marital status: Married    Spouse name: N/A  . Number of children: N/A  .  Years of education: N/A   Occupational History  . Retired    Social History Main Topics  . Smoking status: Former SResearch scientist (life sciences) . Smokeless tobacco: Never Used  . Alcohol use No  . Drug use: Unknown  . Sexual activity: Not Asked   Other Topics Concern  . None   Social History Narrative  . None     Family History  Problem Relation Age of Onset  . Stroke Father      ROS:   Please see the history of present illness.    ROS All other systems reviewed and are negative.   EKGs/Labs/Other Test Reviewed:    EKG:  EKG is  ordered today.  The ekg ordered today demonstrates NSR, HR 75, left axis deviation, nonspecific ST-T  wave changes, LVH, QTc 435 ms, no change from prior tracing  Recent Labs: 08/04/2016: Platelets 167 08/20/2016: BUN 36; Creatinine, Ser 1.41; NT-Pro BNP 6,789; Potassium 3.8; Sodium 142   Recent Lipid Panel    Component Value Date/Time   CHOL 177 07/27/2015 0845   TRIG 117 07/27/2015 0845   HDL 26 (L) 07/27/2015 0845   CHOLHDL 6.8 (H) 07/27/2015 0845   VLDL 23 07/27/2015 0845   LDLCALC 128 07/27/2015 0845     Physical Exam:    VS:  BP 118/60   Pulse 75   Ht _0  (1.676 m)   Wt 142 lb 12.8 oz (64.8 kg)   BMI 23.05 kg/m     Wt Readings from Last 3 Encounters:  09/05/16 142 lb 12.8 oz (64.8 kg)  08/04/16 144 lb (65.3 kg)  12/19/15 144 lb 12.8 oz (65.7 kg)     Physical Exam  Constitutional: He is oriented to person, place, and time. He appears well-developed and well-nourished. No distress.  HENT:  Head: Normocephalic and atraumatic.  Eyes: No scleral icterus.  Neck: Normal range of motion. No JVD present.  Cardiovascular: Normal rate, regular rhythm, S1 normal and S2 normal.   Murmur heard.  Decrescendo diastolic murmur is present with a grade of 1/6  at the lower left sternal border Pulmonary/Chest: Effort normal and breath sounds normal. He has no wheezes. He has no rhonchi. He has no rales.  Abdominal: Soft. There is no tenderness.    Musculoskeletal: He exhibits edema (trace bilateral ankle edema).  Neurological: He is alert and oriented to person, place, and time.  Skin: Skin is warm and dry.  Psychiatric: He has a normal mood and affect.    ASSESSMENT:    1. Chronic combined systolic and diastolic CHF (congestive heart failure) ([place])   2. Coronary artery disease involving native coronary artery of native heart without angina pectoris   3. Mitral valvular insufficiency and aortic valvular insufficiency   4. Hypertensive heart disease with chronic combined systolic and diastolic congestive heart failure ([place])   5. Bilateral carotid artery disease ([place])   6. CKD (chronic kidney disease) stage 3, GFR 30-59 ml/min    PLAN:    In order of problems listed above:  1. Chronic combined systolic and diastolic CHF (congestive heart failure) (HCC) Ischemic cardiomyopathy. Previous EF 40-45. Recent echocardiogram with EF 35-40. EF overall stable. He is not on ARB secondary to worsening renal function. He has a listed allergy to ace inhibitors. Continue beta blocker, nitrates. His blood pressure will not tolerate hydralazine at this time. Since last visit, his volume is improved. Continue current dose of Lasix. Obtain follow-up BMET.  2. Coronary artery disease involving native coronary artery of native heart without angina pectoris -  s/p CABG.  S/p non-STEMI treated with DES 2 to the native LAD and DES to SVG-OM 9/16.  He does have a chronically occluded RCA and occluded SVG-RCA.  he denies angina. Continue aspirin, Plavix, nitrates, beta blocker. He has been intolerant to statins in the past secondary to elevated LFTs. Zetia was previously recommended but he was nonadherent.  3. Mitral valvular insufficiency and aortic valvular insufficiency- Mitral valve and aortic valve pathology appears stable.  4. Hypertensive heart disease with chronic combined systolic and diastolic congestive heart failure (HCC) -  Blood pressure  controlled.   -  Plan: Basic Metabolic Panel (BMET)  5. Bilateral carotid artery disease (HCC) -  Continue aspirin, Plavix. Not on statin secondary to elevated LFTs in  the past.   -  Plan: VAS US CAROTID in August 2018  6. CKD (chronic kidney disease) stage 3, GFR 30-59 ml/min -  Plan: Basic Metabolic Panel (BMET)   Dispo:  Return in about 2 months (around 11/05/2016) for Routine Follow Up, w/ [name], PA-C.   Medication Adjustments/Labs and Tests Ordered: Current medicines are reviewed at length with the patient today.  Concerns regarding medicines are outlined above.  Orders placed this visit:  Orders Placed This Encounter  Procedures  . Basic Metabolic Panel (BMET)  . EKG 12-Lead   Medication changes this visit: No orders of the defined types were placed in this encounter.   Signed, [name], PA-C  09/05/2016 9:31 AM    [facility] [address], [place], [place]  [address] Phone: [phone]; Fax: [phone]

## 2016-09-05 ENCOUNTER — Ambulatory Visit (INDEPENDENT_AMBULATORY_CARE_PROVIDER_SITE_OTHER): Payer: Medicare Other | Admitting: Physician Assistant

## 2016-09-05 ENCOUNTER — Encounter: Payer: Self-pay | Admitting: Physician Assistant

## 2016-09-05 ENCOUNTER — Telehealth: Payer: Self-pay | Admitting: *Deleted

## 2016-09-05 VITALS — BP 118/60 | HR 75 | Ht 66.0 in | Wt 142.8 lb

## 2016-09-05 DIAGNOSIS — I5042 Chronic combined systolic (congestive) and diastolic (congestive) heart failure: Secondary | ICD-10-CM | POA: Diagnosis not present

## 2016-09-05 DIAGNOSIS — I2510 Atherosclerotic heart disease of native coronary artery without angina pectoris: Secondary | ICD-10-CM

## 2016-09-05 DIAGNOSIS — I110 Hypertensive heart disease with heart failure: Secondary | ICD-10-CM | POA: Diagnosis not present

## 2016-09-05 DIAGNOSIS — I080 Rheumatic disorders of both mitral and aortic valves: Secondary | ICD-10-CM | POA: Diagnosis not present

## 2016-09-05 DIAGNOSIS — I739 Peripheral vascular disease, unspecified: Secondary | ICD-10-CM

## 2016-09-05 DIAGNOSIS — I779 Disorder of arteries and arterioles, unspecified: Secondary | ICD-10-CM

## 2016-09-05 DIAGNOSIS — N183 Chronic kidney disease, stage 3 unspecified: Secondary | ICD-10-CM

## 2016-09-05 LAB — BASIC METABOLIC PANEL
BUN/Creatinine Ratio: 22 (ref 10–24)
BUN: 31 mg/dL — AB (ref 8–27)
CO2: 22 mmol/L (ref 18–29)
Calcium: 8.4 mg/dL — ABNORMAL LOW (ref 8.6–10.2)
Chloride: 101 mmol/L (ref 96–106)
Creatinine, Ser: 1.38 mg/dL — ABNORMAL HIGH (ref 0.76–1.27)
GFR, EST AFRICAN AMERICAN: 56 mL/min/{1.73_m2} — AB (ref >59)
GFR, EST NON AFRICAN AMERICAN: 48 mL/min/{1.73_m2} — AB (ref >59)
GLUCOSE: 109 mg/dL — AB (ref 65–99)
Potassium: 3.9 mmol/L (ref 3.5–5.2)
Sodium: 139 mmol/L (ref 134–144)

## 2016-09-05 NOTE — Telephone Encounter (Signed)
-----   Message from Liliane Shi, [place] sent at 09/05/2016  4:22 PM EDT ----- Please call the patient. Kidney function is stable. All other parameters are within acceptable limits and no further intervention or testing required. Continue with current treatment plan. [name], PA-C   09/05/2016 4:22 PM

## 2016-09-05 NOTE — Telephone Encounter (Signed)
DPR ok to [facility]. Lab work ok, kidney function stable. Continue on current Tx plan. If any questions please call back [phone].

## 2016-09-05 NOTE — Patient Instructions (Addendum)
Medication Instructions:  Your physician recommends that you continue on your current medications as directed. Please refer to the Current Medication list given to you today.  Labwork: TODAY BMET  Testing/Procedures: Your physician has requested that you have a carotid duplex. This test is an ultrasound of the carotid arteries in your neck. It looks at blood flow through these arteries that supply the brain with blood. Allow one hour for this exam. There are no restrictions or special instructions. THIS IS TO BE DONE IN 12/2016  Follow-Up: 1. SCOTT WEAVER, PAC IN 2 MONTHS  2. DR. END IN 6 MONTHS; WE WILL SEND OUT A REMINDER LETTER IN A FEW MONTHS TO CALL AND MAKE AN APPOINTMENT WITH DR. END  Any Other Special Instructions Will Be Listed Below (If Applicable).  If you need a refill on your cardiac medications before your next appointment, please call your pharmacy.

## 2016-09-15 ENCOUNTER — Ambulatory Visit: Payer: Medicare Other | Admitting: Physician Assistant

## 2016-09-21 ENCOUNTER — Other Ambulatory Visit: Payer: Self-pay | Admitting: Physician Assistant

## 2016-09-21 DIAGNOSIS — I25110 Atherosclerotic heart disease of native coronary artery with unstable angina pectoris: Secondary | ICD-10-CM

## 2016-10-02 ENCOUNTER — Ambulatory Visit (HOSPITAL_COMMUNITY)
Admission: RE | Admit: 2016-10-02 | Discharge: 2016-10-02 | Disposition: A | Discharge status: Home / Self Care | Payer: Medicare Other | Source: Ambulatory Visit | Attending: Cardiology | Admitting: Cardiology

## 2016-10-02 DIAGNOSIS — I779 Disorder of arteries and arterioles, unspecified: Secondary | ICD-10-CM | POA: Diagnosis not present

## 2016-10-02 DIAGNOSIS — I739 Peripheral vascular disease, unspecified: Secondary | ICD-10-CM

## 2016-10-03 ENCOUNTER — Encounter: Payer: Self-pay | Admitting: Physician Assistant

## 2016-10-03 ENCOUNTER — Telehealth: Payer: Self-pay | Admitting: *Deleted

## 2016-10-03 DIAGNOSIS — I739 Peripheral vascular disease, unspecified: Principal | ICD-10-CM

## 2016-10-03 DIAGNOSIS — I779 Disorder of arteries and arterioles, unspecified: Secondary | ICD-10-CM

## 2016-10-03 NOTE — Telephone Encounter (Signed)
-----   Message from Liliane Shi, [place] sent at 10/03/2016  2:07 PM EDT ----- Please call the patient. The carotid ultrasound shows stable bilat ICA plaque (1-39%).   Continue current Rx.  Repeat in 1 year.  Please fax a copy of this study result to his PCP:  Shirline Frees, MD  Thanks! [name], PA-C    10/03/2016 2:05 PM

## 2016-10-03 NOTE — Telephone Encounter (Signed)
DPR for pt's wife who has been notified of Carotid results for the pt. Will repeat Carotid in 1 year as well as I will fax a copy to Dr. Kenton Kingfisher pt's PCP.

## 2016-10-07 ENCOUNTER — Telehealth: Payer: Self-pay | Admitting: Physician Assistant

## 2016-10-07 NOTE — Telephone Encounter (Signed)
New message    Pt c/o medication issue:  1. Name of Medication: clopidogrel 75 mg  2. How are you currently taking this medication (dosage and times per day)? Once daily  3. Are you having a reaction (difficulty breathing--STAT)? Blood in stool/urine  4. What is your medication issue? Pt is having bllod in urine

## 2016-10-09 NOTE — Telephone Encounter (Signed)
I s/w pt's wife (DPR) who has been advised per recommendations from [place] to see PCP or Urologist ASAP. Ok to stay off the Plavix until seen by Urology or PCP, though needs appt this week with either PCP or Urology. Pt wife verbalized understanding to plan of care and recommendations. Wife thanked me for my call today. I apologized that just calling back today, however I had sent a message to the PA but message never went through.

## 2016-10-09 NOTE — Telephone Encounter (Signed)
Mr. Greenley needs to see his PCP for referral to Urology ASAP for hematuria (or if he has a Dealer, he needs to make an appointment ASAP). Ok to hold Plavix until seen by Urology. [name], PA-C    10/09/2016 11:13 AM

## 2016-10-09 NOTE — Telephone Encounter (Signed)
I will route to PA for further recommendations

## 2016-10-31 ENCOUNTER — Other Ambulatory Visit: Payer: Self-pay | Admitting: Physician Assistant

## 2016-10-31 ENCOUNTER — Encounter (INDEPENDENT_AMBULATORY_CARE_PROVIDER_SITE_OTHER): Payer: Self-pay

## 2016-10-31 ENCOUNTER — Ambulatory Visit (INDEPENDENT_AMBULATORY_CARE_PROVIDER_SITE_OTHER): Payer: Medicare Other | Admitting: Physician Assistant

## 2016-10-31 ENCOUNTER — Encounter: Payer: Self-pay | Admitting: Physician Assistant

## 2016-10-31 VITALS — BP 130/70 | HR 66 | Ht 66.0 in | Wt 141.0 lb

## 2016-10-31 DIAGNOSIS — I110 Hypertensive heart disease with heart failure: Secondary | ICD-10-CM

## 2016-10-31 DIAGNOSIS — I080 Rheumatic disorders of both mitral and aortic valves: Secondary | ICD-10-CM

## 2016-10-31 DIAGNOSIS — I739 Peripheral vascular disease, unspecified: Secondary | ICD-10-CM

## 2016-10-31 DIAGNOSIS — I2510 Atherosclerotic heart disease of native coronary artery without angina pectoris: Secondary | ICD-10-CM | POA: Diagnosis not present

## 2016-10-31 DIAGNOSIS — I779 Disorder of arteries and arterioles, unspecified: Secondary | ICD-10-CM

## 2016-10-31 DIAGNOSIS — I5042 Chronic combined systolic (congestive) and diastolic (congestive) heart failure: Secondary | ICD-10-CM

## 2016-10-31 DIAGNOSIS — I255 Ischemic cardiomyopathy: Secondary | ICD-10-CM

## 2016-10-31 DIAGNOSIS — R319 Hematuria, unspecified: Secondary | ICD-10-CM | POA: Diagnosis not present

## 2016-10-31 MED ORDER — ISOSORBIDE MONONITRATE ER 30 MG PO TB24
30.0000 mg | ORAL_TABLET | Freq: Every day | ORAL | 3 refills | Status: DC
Start: 2016-10-31 — End: 2017-06-25

## 2016-10-31 NOTE — Patient Instructions (Addendum)
Medication Instructions:  1. STOP ASPIRIN  2. RESTART PLAVIX 75 MG 1 TABLET DAILY  Labwork: NONE ORDERED  Testing/Procedures: NONE ORDERED  Follow-Up: DR. END IN 02/2017; WE WILL SEND OUT A REMINDER LETTER TO HAVE YOU CALL AND MAKE AN APPT  Any Other Special Instructions Will Be Listed Below (If Applicable).     If you need a refill on your cardiac medications before your next appointment, please call your pharmacy.

## 2016-10-31 NOTE — Progress Notes (Signed)
Cardiology Office Note:    Date:  10/31/2016   ID:  [name], DOB 12-03-1934, MRN [id number]  PCP:  Corey Frees, MD  [name]  >> prefers to remain at [name] (will est with [name] and Corey Dopp, PA-C)  Referring MD: Corey Frees, MD   No chief complaint on file.   History of Present Illness:    [name] is a 82 y.o. male with a hx of CAD, s/p CABG in 2003, aortic insufficiency, HTN, HL, carotid artery disease.  He has been nonadherent to follow-up and medications in the past. He has a history of elevated LFTs and total CK on statin therapy in the past.  He had a NSTEMI in 9/16 and Cardiac Catheterization demonstrated severe 3 vessel CAD with a chronically occluded LCx, RCA and severe subtotal occlusion of the native mid LAD, chronically occluded SVG-RCA and SVG-LAD with severe stenosis and SVG-OM. He underwent PCI with DES 2 in overlapping fashion to the native LAD as well as DES to the SVG-OM. Echocardiogram demonstrated an EF of 25-30%, mild AS, moderate AI and mild MR.  FU echo in 1/17 demonstrated improved EF 40-45%.  Because of epistaxis, we changed his Brilinta to Plavix.    He was last seen 4/18.  [name] returns for Cardiology follow up.  He is here with his wife.  He feels his shortness of breath is stable.  He has not had any chest pain, syncope, paroxysmal nocturnal dyspnea. His leg edema is stable.  He stopped his Clopidogrel ~ 1 month ago b/c of hematuria.  He was to see Urology but never did.  His wife notes he had blood in his stool as well. He did see his PCP.  It sounds like he was tx for a UTI.  He has not had further bleeding.  His wife notes that the Clopidogrel looked different and she was concerned that the bleeding was caused by Clopidogrel.    Prior CV studies:   The following studies were reviewed today:  Carotid US 5/18 bilat ICA 1-39 >> f/u 1 year  Echo 08/20/16 Mild conc LVH, EF 35-40, ant-sept and inf-sept HK, Gr 2 DD,  mild AS (mean 13), mod AI, mild dilated asc aorta (43 mm), MAC, mod MR, severe LAE, mild TR, mod PI, PASP 43   Carotid US 0/48:  RICA 88-91%; LICA 6-94% >> FU 1 year   Echo 05/31/15 Mild LVH, EF 40-45%, inf-septal HK, apical inf-septal AK, Gr 1 DD, mild AI, Aortic Sclerosis without stenosis (mean 15 mmHg), mod MAC, mild to mod reduced RVSF   Echo 01/28/15 Inferoseptal HK, inferior HK, apical AK, anteroseptal HK, moderate LVH, EF 50-38% grade 2 diastolic dysfunction, mild aortic stenosis (mean 9 mmHg), moderate AI, mildly dilated aortic root, ascending aortic diameter 40 mm (mildly dilated), mild MR, mild LAE, mild PI, PASP 36 mmHg   LHC 01/27/15 LM: 40% LAD: Mid 99%, 99% >> PCI: Synergy DES x 2 (overlapping) LCx: 100% RCA: 100% SVG-LAD: 100% SVG-OM2 90% >> PCI: Synergy DES SVG-RCA 100% EF 25-35%, 4+ MR  Abd Aorta US 09/19/13 No AAA   Carotid US 09/19/13 Bilateral ICA 1-39% FU 2 years (due in 09/2015)  Past Medical History:  Diagnosis Date  . CAD (coronary artery disease)    a. CABG (2003)  b. 9/17/6: NSTEMI s/p overlapping DES x2 to LAD and DESx1 to SVG-->OM   . Carotid artery disease ([place])    a. 09/2013: carotid dopplers with stable 1-39%  ICA stenosis bilaterally  //  b. Carotid US 6/56: RICA 81-27%; LICA 5-17% >> FU 1 year // c. Carotid US 09/2016: Bilateral ICA 1-39 >> follow-up 1 year  . Dementia   . History of echocardiogram    Echo 4/18: mild conc LVH, EF 35-40, diff HK, anteroseptal and inf-septal HK, Gr 2 DD, mild AS (mean 13), mod AI, mildly dilated asc Ao (43 mm), mod MAC, mod MR, severe LAE, mild TR, mod PI, PASP 43  . Hyperlipidemia   . Hypertension   . Internal hemorrhoids   . Ischemic cardiomyopathy    a. 2D ECHO 01/28/15 w/ EF 25-30%, severe HK of the basal-midinferoseptal myocardium, basal-midinferior myocardium and midanteroseptal myocardium. Akinesis of apical inferior and apicalanterior myocardium. G2DD. Mild AS, Asc aorta diameter 70m. Mild dilation of aortic  root. Mild MR, mild PR and PA peak pressure 36 mmg HG.  . Peptic ulcer disease   . Prostate CA ([facility]    a. s/p seed implants    Past Surgical History:  Procedure Laterality Date  . CARDIAC CATHETERIZATION N/A 01/27/2015   Procedure: Left Heart Cath and Coronary Angiography;  Surgeon: Corey Hampshire MD;  [facility];  Service: Cardiovascular;  Laterality: N/A;  . CARDIAC SURGERY  2003   SVG LAD, SVG to right coronary artery, SVG to marginal    Current Medications: Current Meds  Medication Sig  . carvedilol (COREG) 6.25 MG tablet Take 1 tablet (6.25 mg total) by mouth 2 (two) times daily with a meal. Reported on 06/20/2015  . clopidogrel (PLAVIX) 75 MG tablet take 1 tablet by mouth once daily  . furosemide (LASIX) 40 MG tablet Take 40 mg by mouth daily.  . nitroGLYCERIN (NITROSTAT) 0.4 MG SL tablet Place 1 tablet (0.4 mg total) under the tongue every 5 (five) minutes x 3 doses as needed for chest pain.  .Marland KitchenPROAIR HFA 108 (90 Base) MCG/ACT inhaler inhale 2 puffs every 6 hours  . [DISCONTINUED] aspirin EC 81 MG tablet Take 1 tablet (81 mg total) by mouth daily.  . [DISCONTINUED] isosorbide mononitrate (IMDUR) 30 MG 24 hr tablet Take 0.5 tablets (15 mg total) by mouth daily.     Allergies:   Ramipril; Latex; and Statins   Social History   Social History  . Marital status: Married    Spouse name: N/A  . Number of children: N/A  . Years of education: N/A   Occupational History  . Retired    Social History Main Topics  . Smoking status: Former SResearch scientist (life sciences) . Smokeless tobacco: Never Used  . Alcohol use No  . Drug use: Unknown  . Sexual activity: Not Asked   Other Topics Concern  . None   Social History Narrative  . None     Family Hx: The patient's family history includes Stroke in his father.  ROS:   Please see the history of present illness.    ROS All other systems reviewed and are negative.   EKGs/Labs/Other Test Reviewed:    EKG:  EKG is  ordered  today.  The ekg ordered today demonstrates NSR, HR 66, LAD, 1st degree AVB (PR 214 ms), PVC, NSSTTW changes, QTc 417 ms, no significant change  Recent Labs: 08/04/2016: Hemoglobin 11.9; Platelets 167 08/20/2016: NT-Pro BNP 6,789 09/05/2016: BUN 31; Creatinine, Ser 1.38; Potassium 3.9; Sodium 139   Recent Lipid Panel Lab Results  Component Value Date/Time   CHOL 177 07/27/2015 08:45 AM   TRIG 117 07/27/2015 08:45 AM  HDL 26 (L) 07/27/2015 08:45 AM   CHOLHDL 6.8 (H) 07/27/2015 08:45 AM   LDLCALC 128 07/27/2015 08:45 AM    Physical Exam:    VS:  BP 130/70   Pulse 66   Ht _0  (1.676 m)   Wt 141 lb (64 kg)   BMI 22.76 kg/m     Wt Readings from Last 3 Encounters:  10/31/16 141 lb (64 kg)  09/05/16 142 lb 12.8 oz (64.8 kg)  08/04/16 144 lb (65.3 kg)     Physical Exam  Constitutional: He is oriented to person, place, and time. He appears well-developed and well-nourished. No distress.  HENT:  Head: Normocephalic and atraumatic.  Eyes: No scleral icterus.  Neck: Normal range of motion. No JVD present.  Cardiovascular: Normal rate, regular rhythm, S1 normal and S2 normal.   No murmur heard. Pulmonary/Chest: Effort normal and breath sounds normal. He has no wheezes. He has no rhonchi. He has no rales.  Abdominal: Soft. There is no tenderness.  Musculoskeletal: He exhibits edema (tracie-1+ bilateral Edema).  Neurological: He is alert and oriented to person, place, and time.  Skin: Skin is warm and dry.  Psychiatric: He has a normal mood and affect.    ASSESSMENT:    1. Chronic combined systolic and diastolic CHF (congestive heart failure) ([place])   2. Coronary artery disease involving native coronary artery of native heart without angina pectoris   3. Mitral valvular insufficiency and aortic valvular insufficiency   4. Hypertensive heart disease with chronic combined systolic and diastolic congestive heart failure ([place])   5. Bilateral carotid artery disease ([place])   6.  Hematuria, unspecified type    PLAN:    In order of problems listed above:  1. Chronic combined systolic and diastolic CHF (congestive heart failure) (HCC) -  2/2 Ischemic CM.  EF 35-40.  He cannot tol angiotensin receptor blocker due worsening renal function and is allergic to ACE inhibitors.  Continue beta-blocker.  I will increase his Isosorbide to 30 mg QD.    2. Coronary artery disease involving native coronary artery of native heart without angina pectoris -  s/p CABG.  S/p non-STEMI treated with DES 2 to the native LAD and DES to SVG-OM 9/16.  He does have a chronically occluded RCA and occluded SVG-RCA.  He is not having angina.  He stopped Clopidogrel due to hematuria (also ? Rectal bleeding).  Since he has multiple stents in SVGs, I think it would be better to keep him on Clopidogrel.  He is almost 2 years out from his MI and PCI.  I think we can stop his ASA and continue Clopidogrel.  -  DC ASA.  Resume Clopidogrel 75 mg QD.  -  Continue beta-blocker, nitrates  -  He has been non-adherent to cholesterol Rx in the past (Statins, Ezetimibe)   3. Mitral valvular insufficiency and aortic valvular insufficiency - Stable on last echocardiogram.   4. Hypertensive heart disease with chronic combined systolic and diastolic congestive heart failure ([place]) - The patient's blood pressure is controlled on his current regimen.  Continue current therapy.    5. Bilateral carotid artery disease ([place]) - Carotid US due in 09/2017.    6. Hematuria, unspecified type  - He also notes ? Rectal bleeding.  He will continue follow up with PCP. If he has recurrent bleeding with Clopidogrel alone, I have asked him to call.     Dispo:  Return in about 4 months (around 03/02/2017) for Routine Follow Up,  w/ Dr. Saunders Revel.   Medication Adjustments/Labs and Tests Ordered: Current medicines are reviewed at length with the patient today.  Concerns regarding medicines are outlined above.  Orders/Tests:  Orders  Placed This Encounter  Procedures  . EKG 12-Lead   Medication changes: Meds ordered this encounter  Medications  . isosorbide mononitrate (IMDUR) 30 MG 24 hr tablet    Sig: Take 1 tablet (30 mg total) by mouth daily.    Dispense:  90 tablet    Refill:  3   Signed, Corey Dopp, PA-C  10/31/2016 1:55 PM    [facility] [employer], [place], [place]  [address] Phone: [phone]; Fax: [phone]

## 2016-12-23 ENCOUNTER — Other Ambulatory Visit: Payer: Self-pay | Admitting: Physician Assistant

## 2017-06-19 ENCOUNTER — Observation Stay (HOSPITAL_COMMUNITY)
Admission: EM | Admit: 2017-06-19 | Discharge: 2017-06-21 | Disposition: A | Discharge status: Home / Self Care | Payer: Medicare Other | Attending: Internal Medicine | Admitting: Internal Medicine

## 2017-06-19 ENCOUNTER — Emergency Department (HOSPITAL_COMMUNITY): Payer: Medicare Other

## 2017-06-19 ENCOUNTER — Telehealth: Payer: Self-pay

## 2017-06-19 ENCOUNTER — Other Ambulatory Visit: Payer: Self-pay

## 2017-06-19 ENCOUNTER — Encounter (HOSPITAL_COMMUNITY): Payer: Self-pay

## 2017-06-19 DIAGNOSIS — I252 Old myocardial infarction: Secondary | ICD-10-CM | POA: Diagnosis not present

## 2017-06-19 DIAGNOSIS — E876 Hypokalemia: Secondary | ICD-10-CM | POA: Diagnosis not present

## 2017-06-19 DIAGNOSIS — N183 Chronic kidney disease, stage 3 unspecified: Secondary | ICD-10-CM | POA: Diagnosis present

## 2017-06-19 DIAGNOSIS — I130 Hypertensive heart and chronic kidney disease with heart failure and stage 1 through stage 4 chronic kidney disease, or unspecified chronic kidney disease: Secondary | ICD-10-CM | POA: Diagnosis not present

## 2017-06-19 DIAGNOSIS — I2510 Atherosclerotic heart disease of native coronary artery without angina pectoris: Secondary | ICD-10-CM | POA: Diagnosis not present

## 2017-06-19 DIAGNOSIS — Z955 Presence of coronary angioplasty implant and graft: Secondary | ICD-10-CM | POA: Insufficient documentation

## 2017-06-19 DIAGNOSIS — Z951 Presence of aortocoronary bypass graft: Secondary | ICD-10-CM | POA: Insufficient documentation

## 2017-06-19 DIAGNOSIS — I352 Nonrheumatic aortic (valve) stenosis with insufficiency: Secondary | ICD-10-CM | POA: Insufficient documentation

## 2017-06-19 DIAGNOSIS — I5042 Chronic combined systolic (congestive) and diastolic (congestive) heart failure: Secondary | ICD-10-CM

## 2017-06-19 DIAGNOSIS — E785 Hyperlipidemia, unspecified: Secondary | ICD-10-CM | POA: Insufficient documentation

## 2017-06-19 DIAGNOSIS — I110 Hypertensive heart disease with heart failure: Secondary | ICD-10-CM

## 2017-06-19 DIAGNOSIS — F0390 Unspecified dementia without behavioral disturbance: Secondary | ICD-10-CM

## 2017-06-19 DIAGNOSIS — I5023 Acute on chronic systolic (congestive) heart failure: Secondary | ICD-10-CM

## 2017-06-19 DIAGNOSIS — Z8711 Personal history of peptic ulcer disease: Secondary | ICD-10-CM | POA: Diagnosis not present

## 2017-06-19 DIAGNOSIS — I255 Ischemic cardiomyopathy: Secondary | ICD-10-CM | POA: Diagnosis not present

## 2017-06-19 DIAGNOSIS — Z7902 Long term (current) use of antithrombotics/antiplatelets: Secondary | ICD-10-CM | POA: Diagnosis not present

## 2017-06-19 DIAGNOSIS — Z91040 Latex allergy status: Secondary | ICD-10-CM | POA: Insufficient documentation

## 2017-06-19 DIAGNOSIS — I5043 Acute on chronic combined systolic (congestive) and diastolic (congestive) heart failure: Secondary | ICD-10-CM | POA: Diagnosis not present

## 2017-06-19 DIAGNOSIS — I509 Heart failure, unspecified: Secondary | ICD-10-CM

## 2017-06-19 DIAGNOSIS — I25118 Atherosclerotic heart disease of native coronary artery with other forms of angina pectoris: Secondary | ICD-10-CM | POA: Diagnosis present

## 2017-06-19 DIAGNOSIS — Z87891 Personal history of nicotine dependence: Secondary | ICD-10-CM | POA: Diagnosis not present

## 2017-06-19 HISTORY — DX: Heart failure, unspecified: I50.9

## 2017-06-19 LAB — CBC
HCT: 37.4 % — ABNORMAL LOW (ref 39.0–52.0)
Hemoglobin: 12.3 g/dL — ABNORMAL LOW (ref 13.0–17.0)
MCH: 28.6 pg (ref 26.0–34.0)
MCHC: 32.9 g/dL (ref 30.0–36.0)
MCV: 87 fL (ref 78.0–100.0)
Platelets: 154 10*3/uL (ref 150–400)
RBC: 4.3 MIL/uL (ref 4.22–5.81)
RDW: 14.4 % (ref 11.5–15.5)
WBC: 4.8 10*3/uL (ref 4.0–10.5)

## 2017-06-19 LAB — BASIC METABOLIC PANEL
Anion gap: 13 (ref 5–15)
BUN: 22 mg/dL — AB (ref 6–20)
CHLORIDE: 101 mmol/L (ref 101–111)
CO2: 27 mmol/L (ref 22–32)
Calcium: 9.2 mg/dL (ref 8.9–10.3)
Creatinine, Ser: 1.6 mg/dL — ABNORMAL HIGH (ref 0.61–1.24)
GFR calc Af Amer: 45 mL/min — ABNORMAL LOW (ref >60)
GFR calc non Af Amer: 39 mL/min — ABNORMAL LOW (ref >60)
GLUCOSE: 106 mg/dL — AB (ref 65–99)
POTASSIUM: 3.8 mmol/L (ref 3.5–5.1)
Sodium: 141 mmol/L (ref 135–145)

## 2017-06-19 LAB — BRAIN NATRIURETIC PEPTIDE: B NATRIURETIC PEPTIDE 5: 1224.9 pg/mL — AB (ref 0.0–100.0)

## 2017-06-19 LAB — I-STAT TROPONIN, ED: Troponin i, poc: 0.03 ng/mL (ref 0.00–0.08)

## 2017-06-19 MED ORDER — CLOPIDOGREL BISULFATE 75 MG PO TABS
75.0000 mg | ORAL_TABLET | Freq: Every day | ORAL | Status: DC
  Administered 2017-06-20 – 2017-06-21 (×2): 75 mg via ORAL
  Filled 2017-06-19 (×2): qty 1

## 2017-06-19 MED ORDER — ENOXAPARIN SODIUM 40 MG/0.4ML ~~LOC~~ SOLN
40.0000 mg | SUBCUTANEOUS | Status: DC
  Filled 2017-06-19 (×2): qty 0.4

## 2017-06-19 MED ORDER — FUROSEMIDE 10 MG/ML IJ SOLN
40.0000 mg | Freq: Every day | INTRAMUSCULAR | Status: DC
  Administered 2017-06-20 – 2017-06-21 (×2): 40 mg via INTRAVENOUS
  Filled 2017-06-19 (×2): qty 4

## 2017-06-19 MED ORDER — LORATADINE 10 MG PO TABS
10.0000 mg | ORAL_TABLET | Freq: Every day | ORAL | Status: DC
  Administered 2017-06-20 – 2017-06-21 (×2): 10 mg via ORAL
  Filled 2017-06-19 (×2): qty 1

## 2017-06-19 MED ORDER — ISOSORBIDE MONONITRATE ER 30 MG PO TB24
30.0000 mg | ORAL_TABLET | Freq: Every day | ORAL | Status: DC
  Administered 2017-06-20 – 2017-06-21 (×2): 30 mg via ORAL
  Filled 2017-06-19 (×2): qty 1

## 2017-06-19 MED ORDER — CARVEDILOL 6.25 MG PO TABS
6.2500 mg | ORAL_TABLET | Freq: Two times a day (BID) | ORAL | Status: DC
  Administered 2017-06-20 – 2017-06-21 (×3): 6.25 mg via ORAL
  Filled 2017-06-19 (×3): qty 1

## 2017-06-19 MED ORDER — ALBUTEROL SULFATE HFA 108 (90 BASE) MCG/ACT IN AERS: 2.0000 | INHALATION_SPRAY | Freq: Four times a day (QID) | RESPIRATORY_TRACT | Status: DC | PRN

## 2017-06-19 MED ORDER — FUROSEMIDE 10 MG/ML IJ SOLN
40.0000 mg | Freq: Once | INTRAMUSCULAR | Status: AC
  Administered 2017-06-19: 40 mg via INTRAVENOUS
  Filled 2017-06-19: qty 4

## 2017-06-19 NOTE — ED Notes (Signed)
[facility] for shob that has worsened since yesterday. Pt CP but has hx of MI. Breath sounds clear. VSS

## 2017-06-19 NOTE — Telephone Encounter (Signed)
Patient walked with minimal, yet recent hx of SOB.  He came over here after going to [facility], who recommended he travel to the ED.  I assessed the patient and also recommended he go to the ED.  Patient and wife verbalized understanding.  We also scheduled him for an OV 2/14 with L. Ingold.Corey KitchenMarland Rivers

## 2017-06-19 NOTE — ED Provider Notes (Signed)
[facility] EMERGENCY DEPARTMENT Provider Note   CSN: [id number] Arrival date & time: 06/19/17  1408     History   Chief Complaint Chief Complaint  Patient presents with  . Shortness of Breath    HPI [name] is a 83 y.o. male.  HPI  83 year old male with a history of coronary disease and systolic CHF with a most recent EF of 35-40% in 2018 presents with shortness of breath.  Wife does most of the history and he has been short of breath with mild nonproductive cough and some congestion for a couple days.  No fevers or chest pain.  Denies any significant weight gain to his knowledge or shortness of breath.  He states he weighed himself on Sunday and his weight was 139 pounds.  He has been taking his medicines as prescribed and denies any significant diet changes.  Past Medical History:  Diagnosis Date  . CAD (coronary artery disease)    a. CABG (2003)  b. 9/17/6: NSTEMI s/p overlapping DES x2 to LAD and DESx1 to SVG-->OM   . Carotid artery disease ([place])    a. 09/2013: carotid dopplers with stable 1-39% ICA stenosis bilaterally  //  b. Carotid US 2/42: RICA 68-34%; LICA 1-96% >> FU 1 year // c. Carotid US 09/2016: Bilateral ICA 1-39 >> follow-up 1 year  . CHF (congestive heart failure) ([place])   . Dementia   . History of echocardiogram    Echo 4/18: mild conc LVH, EF 35-40, diff HK, anteroseptal and inf-septal HK, Gr 2 DD, mild AS (mean 13), mod AI, mildly dilated asc Ao (43 mm), mod MAC, mod MR, severe LAE, mild TR, mod PI, PASP 43  . Hyperlipidemia   . Hypertension   . Internal hemorrhoids   . Ischemic cardiomyopathy    a. 2D ECHO 01/28/15 w/ EF 25-30%, severe HK of the basal-midinferoseptal myocardium, basal-midinferior myocardium and midanteroseptal myocardium. Akinesis of apical inferior and apicalanterior myocardium. G2DD. Mild AS, Asc aorta diameter 48m. Mild dilation of aortic root. Mild MR, mild PR and PA peak pressure 36 mmg HG.  . Peptic ulcer disease     . Prostate CA ([facility]    a. s/p seed implants    Patient Active Problem List   Diagnosis Date Noted  . Chronic combined systolic and diastolic CHF (congestive heart failure) ([place] 09/04/2016  . CKD (chronic kidney disease) stage 3, GFR 30-59 ml/min (HCC) 08/04/2016  . Carotid artery disease ([place]   . Dementia   . CAD (coronary artery disease)   . Ischemic cardiomyopathy   . Hyperlipidemia, unspecified 01/27/2015  . History of non-ST elevation myocardial infarction (NSTEMI) 01/27/2015  . Abdominal bruit 09/08/2013  . Mitral valvular insufficiency and aortic valvular insufficiency 03/23/2009  . PROSTATE CANCER 03/22/2009  . Hypertensive heart disease with chronic combined systolic and diastolic congestive heart failure ([place] 03/22/2009    Past Surgical History:  Procedure Laterality Date  . CARDIAC CATHETERIZATION N/A 01/27/2015   Procedure: Left Heart Cath and Coronary Angiography;  Surgeon: MWellington Hampshire MD;  [facility];  Service: Cardiovascular;  Laterality: N/A;  . CARDIAC SURGERY  2003   SVG LAD, SVG to right coronary artery, SVG to marginal       Home Medications    Prior to Admission medications   Medication Sig Start Date End Date Taking? Authorizing Provider  carvedilol (COREG) 6.25 MG tablet take 1 tablet by mouth twice a day with meals Patient taking differently: Take 1 tablet (  6.25 mg) by mouth two times a day meals 10/31/16  Yes [name] T, PA-C  clopidogrel (PLAVIX) 75 MG tablet take 1 tablet by mouth once daily Patient taking differently: Take 1 tablet (75 mg) by mouth once a day 09/22/16  Yes Weaver, Trygve Thal T, PA-C  furosemide (LASIX) 40 MG tablet Take 40 mg by mouth daily.   Yes [provider]  isosorbide mononitrate (IMDUR) 30 MG 24 hr tablet Take 1 tablet (30 mg total) by mouth daily. 10/31/16 06/19/17 Yes Weaver, Jillienne Egner T, PA-C  loratadine (CLARITIN) 10 MG tablet Take 10 mg by mouth daily.   Yes [provider]   nitroGLYCERIN (NITROSTAT) 0.4 MG SL tablet place 1 tablet under the tongue if needed every 5 minutes for chest pain  UP TO 3 DOSES 12/24/16  Yes Sh[facility]on Revere  PROAIR HFA 108 [phone] Base) MCG/ACT inhaler Inhale 2 puffs into the lungs every six hours as needed for shortness of breath or wheezing 07/25/16  Yes [provider]    Family History Family History  Problem Relation Age of Onset  . Stroke Father     Social History Social History   Tobacco Use  . Smoking status: Former Research scientist (life sciences)  . Smokeless tobacco: Never Used  Substance Use Topics  . Alcohol use: No  . Drug use: No     Allergies   Ramipril; Latex; and Statins   Review of Systems Review of Systems  HENT: Positive for congestion.   Respiratory: Positive for cough and shortness of breath.   Cardiovascular: Negative for chest pain and leg swelling.  Gastrointestinal: Negative for abdominal distention.  All other systems reviewed and are negative.    Physical Exam Updated Vital Signs BP (!) 150/94 (BP Location: Left Arm)   Pulse 76   Temp 98.8 F (37.1 C) (Oral)   Resp 16   Ht 5' 8"  (1.727 m)   Wt 64.4 kg (142 lb)   SpO2 98%   BMI 21.59 kg/m   Physical Exam  Constitutional: He is oriented to person, place, and time. He appears well-developed and well-nourished.  Non-toxic appearance. He does not appear ill. No distress.  HENT:  Head: Normocephalic and atraumatic.  Right Ear: External ear normal.  Left Ear: External ear normal.  Nose: Nose normal.  Eyes: Right eye exhibits no discharge. Left eye exhibits no discharge.  Neck: Neck supple. JVD present.  Cardiovascular: Normal rate, regular rhythm and normal heart sounds.  Pulmonary/Chest: Breath sounds normal. Tachypnea noted.  Abdominal: Soft. There is no tenderness.  Musculoskeletal:       Right lower leg: He exhibits edema (trace).       Left lower leg: He exhibits edema (trace).  Neurological: He is alert and oriented to person, place,  and time.  Skin: Skin is warm and dry.  Nursing note and vitals reviewed.    ED Treatments / Results  Labs (all labs ordered are listed, but only abnormal results are displayed) Labs Reviewed  BASIC METABOLIC PANEL - Abnormal; Notable for the following components:      Result Value   Glucose, Bld 106 (*)    BUN 22 (*)    Creatinine, Ser 1.60 (*)    GFR calc non Af Amer 39 (*)    GFR calc Af Amer 45 (*)    All other components within normal limits  CBC - Abnormal; Notable for the following components:   Hemoglobin 12.3 (*)    HCT 37.4 (*)  All other components within normal limits  BRAIN NATRIURETIC PEPTIDE - Abnormal; Notable for the following components:   B Natriuretic Peptide 1,224.9 (*)    All other components within normal limits  I-STAT TROPONIN, ED    EKG  EKG Interpretation  Date/Time:  Friday June 19 2017 14:11:28 EST Ventricular Rate:  78 PR Interval:  196 QRS Duration: 114 QT Interval:  392 QTC Calculation: 446 R Axis:   37 Text Interpretation:  Normal sinus rhythm Cannot rule out Anterior infarct , age undetermined T wave abnormality, consider lateral ischemia Abnormal ECG T waves now upright in I, AVL compared to April 2017 Confirmed by Sherwood Gambler [phone]) on 06/19/2017 9:23:04 PM       Radiology Dg Chest 2 View  Result Date: 06/19/2017 CLINICAL DATA:  Worsening shortness of breath since yesterday. Chest pain. EXAM: CHEST  2 VIEW COMPARISON:  08/28/2015. FINDINGS: Stable mildly enlarged cardiac silhouette, post CABG changes and coronary artery stents. No significant change in small bilateral pleural effusions. Decreased left basilar atelectasis and interval mild patchy opacity at the right lung base in addition to previously demonstrated linear opacities. Unremarkable bones. IMPRESSION: 1. Interval increased right basilar atelectasis and possible pneumonia. 2. Decreased left basilar atelectasis. 3. Stable small bilateral pleural effusions and mild  cardiomegaly. Electronically Signed   By: Claudie Revering M.D.   On: 06/19/2017 14:51    Procedures Procedures (including critical care time)  Medications Ordered in ED Medications  furosemide (LASIX) injection 40 mg (40 mg Intravenous Given 06/19/17 2207)     Initial Impression / Assessment and Plan / ED Course  I have reviewed the triage vital signs and the nursing notes.  Pertinent labs & imaging results that were available during my care of the patient were reviewed by me and considered in my medical decision making (see chart for details).     Patient is not in distress but he is significantly tachypneic and gets out of breath easily when talking.  Given this, he will be given IV Lasix for what appears to be a CHF exacerbation.  Unclear exactly what is setting off his CHF but he will need diuresis and admission. Hospitalist to admit.  Final Clinical Impressions(s) / ED Diagnoses   Final diagnoses:  Acute on chronic systolic congestive heart failure [facility])    ED Discharge Orders    None       Sherwood Gambler, MD 06/19/17 2209

## 2017-06-19 NOTE — ED Triage Notes (Signed)
[facility]ob that has worsened since yesterday. Pt CP but has hx of MI. Breath sounds clear. VSS

## 2017-06-19 NOTE — H&P (Signed)
History and Physical    [name] MLJ:[id number] DOB: 04-Oct-1934 DOA: 06/19/2017  PCP: Corey Frees, MD  Patient coming from: home   Chief Complaint: shortness of breath  HPI: [name] is a 83 y.o. male with medical history significant for systolic chf (ef 12-19% 7588), CAD (cabg 2003, nstemi with 2 DESs placed 2016), ckd 3, dementia, htn, here for shortness of breath.  Going on for about a week, worse for the last day or two. Pt is poor historian, wife not much better. No chest pain or palpitations. No cough, no rhinorrhea. No fever. No foot swelling. No PND, denies orthopnea. Thinks dry weight is upper 130s. No hemoptysis. No sick contacts. Compliant with meds. No recent dietary indescretions. Doesn'Rivers weigh self daily.   ED Course: lasix, labs, cxr  Review of Systems: As per HPI otherwise 10 point review of systems negative.    Past Medical History:  Diagnosis Date  . CAD (coronary artery disease)    a. CABG (2003)  b. 9/17/6: NSTEMI s/p overlapping DES x2 to LAD and DESx1 to SVG-->OM   . Carotid artery disease ([place])    a. 09/2013: carotid dopplers with stable 1-39% ICA stenosis bilaterally  //  b. Carotid US 3/25: RICA 49-82%; LICA 6-41% >> FU 1 year // c. Carotid US 09/2016: Bilateral ICA 1-39 >> follow-up 1 year  . CHF (congestive heart failure) ([place])   . Dementia   . History of echocardiogram    Echo 4/18: mild conc LVH, EF 35-40, diff HK, anteroseptal and inf-septal HK, Gr 2 DD, mild AS (mean 13), mod AI, mildly dilated asc Ao (43 mm), mod MAC, mod MR, severe LAE, mild TR, mod PI, PASP 43  . Hyperlipidemia   . Hypertension   . Internal hemorrhoids   . Ischemic cardiomyopathy    a. 2D ECHO 01/28/15 w/ EF 25-30%, severe HK of the basal-midinferoseptal myocardium, basal-midinferior myocardium and midanteroseptal myocardium. Akinesis of apical inferior and apicalanterior myocardium. G2DD. Mild AS, Asc aorta diameter 34m. Mild dilation of aortic root. Mild MR, mild PR and PA  peak pressure 36 mmg HG.  . Peptic ulcer disease   . Prostate CA ([facility]    a. s/p seed implants    Past Surgical History:  Procedure Laterality Date  . CARDIAC CATHETERIZATION N/A 01/27/2015   Procedure: Left Heart Cath and Coronary Angiography;  Surgeon: MWellington Hampshire MD;  Location[facility];  Service: Cardiovascular;  Laterality: N/A;  . CARDIAC SURGERY  2003   SVG LAD, SVG to right coronary artery, SVG to marginal     reports that he has quit smoking. he has never used smokeless tobacco. He reports that he does not drink alcohol or use drugs.  Allergies  Allergen Reactions  . Ramipril Swelling and Other (See Comments)    Angioedema   . Latex Itching and Rash  . Statins Other (See Comments)    Elevated LFTs with statins in the past    Family History  Problem Relation Age of Onset  . Stroke Father     Prior to Admission medications   Medication Sig Start Date End Date Taking? Authorizing Provider  carvedilol (COREG) 6.25 MG tablet take 1 tablet by mouth twice a day with meals Patient taking differently: Take 1 tablet (6.25 mg) by mouth two times a day meals 10/31/16  Yes WRichardson DoppT, PA-C  clopidogrel (PLAVIX) 75 MG tablet take 1 tablet by mouth once daily Patient taking differently: Take 1 tablet (75 mg)  by mouth once a day 09/22/16  Yes Weaver, Scott T, PA-C  furosemide (LASIX) 40 MG tablet Take 40 mg by mouth daily.   Yes [provider]  isosorbide mononitrate (IMDUR) 30 MG 24 hr tablet Take 1 tablet (30 mg total) by mouth daily. 10/31/16 06/19/17 Yes Weaver, Scott T, PA-C  loratadine (CLARITIN) 10 MG tablet Take 10 mg by mouth daily.   Yes [provider]  nitroGLYCERIN (NITROSTAT) 0.4 MG SL tablet place 1 tablet under the tongue if needed every 5 minutes for chest pain  UP TO 3 DOSES 12/24/16  Yes Liliane Shi PA-C  PROAIR HFA 108 [phone] Base) MCG/ACT inhaler Inhale 2 puffs into the lungs every six hours as needed for shortness of breath or  wheezing 07/25/16  Yes [provider]    Physical Exam: Vitals:   06/19/17 1419 06/19/17 1420 06/19/17 1850 06/19/17 2145  BP: 129/75  (!) 150/94 (!) 142/79  Pulse: 68  76 79  Resp: 16  16 (!) 21  Temp: 98.8 F (37.1 C)     TempSrc: Oral     SpO2: 97%  98% 94%  Weight:  64.4 kg (142 lb)    Height:  _0  (1.727 m)      Constitutional: No acute distress Head: Atraumatic Eyes: Conjunctiva clear ENM: Moist mucous membranes. Normal dentition.  Neck: Supple Respiratory: Clear to auscultation bilaterally,save for faint rales at bases and poor inspiratory effort. No tachypnea.  Cardiovascular: Regular rate and rhythm. Distant heart sounds. Faint systolic murmur. Mild jvd. Abdomen: Non-tender, non-distended. No masses. No rebound or guarding. Positive bowel sounds. Musculoskeletal: No joint deformity upper and lower extremities. Normal ROM, no contractures. Normal muscle tone.  Skin: No rashes, lesions, or ulcers.  Extremities: Trace LE edema. Palpable peripheral pulses. Neurologic: Alert, moving all 4 extremities. Psychiatric: slow to answer, sometimes confused   Labs on Admission: I have personally reviewed following labs and imaging studies  CBC: Recent Labs  Lab 06/19/17 1424  WBC 4.8  HGB 12.3*  HCT 37.4*  MCV 87.0  PLT 329   Basic Metabolic Panel: Recent Labs  Lab 06/19/17 1424  NA 141  K 3.8  CL 101  CO2 27  GLUCOSE 106*  BUN 22*  CREATININE 1.60*  CALCIUM 9.2   GFR: Estimated Creatinine Clearance: 33.5 mL/min (A) (by C-G formula based on SCr of 1.6 mg/dL (H)). Liver Function Tests: No results for input(s): AST, ALT, ALKPHOS, BILITOT, PROT, ALBUMIN in the last 168 hours. No results for input(s): LIPASE, AMYLASE in the last 168 hours. No results for input(s): AMMONIA in the last 168 hours. Coagulation Profile: No results for input(s): INR, PROTIME in the last 168 hours. Cardiac Enzymes: No results for input(s): CKTOTAL, CKMB, CKMBINDEX,  TROPONINI in the last 168 hours. BNP (last 3 results) Recent Labs    08/04/16 1558 08/20/16 0916  PROBNP 15,458* 6,789*   HbA1C: No results for input(s): HGBA1C in the last 72 hours. CBG: No results for input(s): GLUCAP in the last 168 hours. Lipid Profile: No results for input(s): CHOL, HDL, LDLCALC, TRIG, CHOLHDL, LDLDIRECT in the last 72 hours. Thyroid Function Tests: No results for input(s): TSH, T4TOTAL, FREET4, T3FREE, THYROIDAB in the last 72 hours. Anemia Panel: No results for input(s): VITAMINB12, FOLATE, FERRITIN, TIBC, IRON, RETICCTPCT in the last 72 hours. Urine analysis:    Component Value Date/Time   COLORURINE YELLOW 01/27/2015 1007   APPEARANCEUR CLOUDY (A) 01/27/2015 1007   LABSPEC 1.023 01/27/2015 1007   PHURINE  5.5 01/27/2015 1007   GLUCOSEU NEGATIVE 01/27/2015 1007   HGBUR MODERATE (A) 01/27/2015 1007   BILIRUBINUR NEGATIVE 01/27/2015 1007   KETONESUR 40 (A) 01/27/2015 1007   PROTEINUR 30 (A) 01/27/2015 1007   UROBILINOGEN 0.2 01/27/2015 1007   NITRITE NEGATIVE 01/27/2015 1007   LEUKOCYTESUR NEGATIVE 01/27/2015 1007    Radiological Exams on Admission: Dg Chest 2 View  Result Date: 06/19/2017 CLINICAL DATA:  Worsening shortness of breath since yesterday. Chest pain. EXAM: CHEST  2 VIEW COMPARISON:  08/28/2015. FINDINGS: Stable mildly enlarged cardiac silhouette, post CABG changes and coronary artery stents. No significant change in small bilateral pleural effusions. Decreased left basilar atelectasis and interval mild patchy opacity at the right lung base in addition to previously demonstrated linear opacities. Unremarkable bones. IMPRESSION: 1. Interval increased right basilar atelectasis and possible pneumonia. 2. Decreased left basilar atelectasis. 3. Stable small bilateral pleural effusions and mild cardiomegaly. Electronically Signed   By: Claudie Revering M.D.   On: 06/19/2017 14:51    EKG: Independently reviewed. Rivers-wave inversions lateral leads, present  in previous ECGs but more marked today  Assessment/Plan Active Problems:   Hypertensive heart disease with chronic combined systolic and diastolic congestive heart failure (HCC)   History of non-ST elevation myocardial infarction (NSTEMI)   Dementia   CAD (coronary artery disease)   CKD (chronic kidney disease) stage 3, GFR 30-59 ml/min (HCC)   CHF exacerbation (HCC)   Acute exacerbation of CHF (congestive heart failure) (HCC)   # acute systolic chf exacerbation # Ischemic heart disease # Coronary artery disease - hx CHF, hx MI/CAD. Did not tolerate arb in past. Has been taken off aspirin. Has been non-adherent with statins. My review of cardiology notes does not indicate why no spironolactone - perhaps also 2/2 kidney function tolerance. Here bnp markedly elevated, and per ED provider marked tachypnea when ambulating, though no sig edema, no orthopnea, no hypoxia. Received lasix 40 IV in ED (home dose is 40 po). Pt thinks dry weight is upper 130s. No infectious symptoms. No chest pain and negative initial troponin. CXR shows possible pneumonia but pt denies cough or other infectious symptoms. No signs of dvt - continue lasix 40 iv qd - daily weights, record I/Os, heart healthy diet - trend troponins (initial negative) - tele overnight - TTE ordered - continue home beta blocker, imdur, plavix - consider CTA to eval for PE if symptoms do not improve with diuresis  # HTN - here bp moderately elevated - continue home meds as above, continue to monitor, adjust as needed  # CKD 3 - here cr 1.6, just slightly above baseline of 1.5 - repeat bmp in AM  DVT prophylaxis: lovenox, scds Code Status: full  Family Communication: wife lurlene Shedd [phone]  Disposition Plan: tbd  Consults called: none  Admission status: tele    Desma Maxim MD [facility] Pager [phone]  If 7PM-7AM, please contact night-coverage [email or web address] Password [facility]  06/19/2017, 10:26 PM

## 2017-06-20 ENCOUNTER — Other Ambulatory Visit: Payer: Self-pay

## 2017-06-20 DIAGNOSIS — I5042 Chronic combined systolic (congestive) and diastolic (congestive) heart failure: Secondary | ICD-10-CM | POA: Diagnosis not present

## 2017-06-20 DIAGNOSIS — I5043 Acute on chronic combined systolic (congestive) and diastolic (congestive) heart failure: Secondary | ICD-10-CM

## 2017-06-20 DIAGNOSIS — N183 Chronic kidney disease, stage 3 (moderate): Secondary | ICD-10-CM | POA: Diagnosis not present

## 2017-06-20 DIAGNOSIS — I110 Hypertensive heart disease with heart failure: Secondary | ICD-10-CM | POA: Diagnosis not present

## 2017-06-20 DIAGNOSIS — I2510 Atherosclerotic heart disease of native coronary artery without angina pectoris: Secondary | ICD-10-CM | POA: Diagnosis not present

## 2017-06-20 LAB — BASIC METABOLIC PANEL
ANION GAP: 9 (ref 5–15)
BUN: 18 mg/dL (ref 6–20)
CALCIUM: 6.6 mg/dL — AB (ref 8.9–10.3)
CHLORIDE: 112 mmol/L — AB (ref 101–111)
CO2: 18 mmol/L — AB (ref 22–32)
Creatinine, Ser: 1.14 mg/dL (ref 0.61–1.24)
GFR calc Af Amer: >60 mL/min (ref >60)
GFR calc non Af Amer: 59 mL/min — ABNORMAL LOW (ref >60)
GLUCOSE: 85 mg/dL (ref 65–99)
Potassium: 2.8 mmol/L — ABNORMAL LOW (ref 3.5–5.1)
Sodium: 139 mmol/L (ref 135–145)

## 2017-06-20 LAB — RENAL FUNCTION PANEL
Albumin: 3.1 g/dL — ABNORMAL LOW (ref 3.5–5.0)
Anion gap: 14 (ref 5–15)
BUN: 28 mg/dL — ABNORMAL HIGH (ref 6–20)
CO2: 24 mmol/L (ref 22–32)
Calcium: 9 mg/dL (ref 8.9–10.3)
Chloride: 100 mmol/L — ABNORMAL LOW (ref 101–111)
Creatinine, Ser: 1.74 mg/dL — ABNORMAL HIGH (ref 0.61–1.24)
GFR calc Af Amer: 41 mL/min — ABNORMAL LOW (ref >60)
GFR calc non Af Amer: 35 mL/min — ABNORMAL LOW (ref >60)
Glucose, Bld: 135 mg/dL — ABNORMAL HIGH (ref 65–99)
Phosphorus: 3.9 mg/dL (ref 2.5–4.6)
Potassium: 3.9 mmol/L (ref 3.5–5.1)
Sodium: 138 mmol/L (ref 135–145)

## 2017-06-20 LAB — TROPONIN I
TROPONIN I: 0.03 ng/mL — AB (ref <0.03)
TROPONIN I: 0.03 ng/mL — AB (ref <0.03)
Troponin I: <0.03 ng/mL (ref <0.03)

## 2017-06-20 MED ORDER — POTASSIUM CHLORIDE CRYS ER 20 MEQ PO TBCR
40.0000 meq | EXTENDED_RELEASE_TABLET | Freq: Four times a day (QID) | ORAL | Status: AC
  Administered 2017-06-20 (×2): 40 meq via ORAL
  Filled 2017-06-20 (×2): qty 2

## 2017-06-20 MED ORDER — ALBUTEROL SULFATE (2.5 MG/3ML) 0.083% IN NEBU: 2.5000 mg | INHALATION_SOLUTION | Freq: Four times a day (QID) | RESPIRATORY_TRACT | Status: DC | PRN

## 2017-06-20 NOTE — Care Management CC44 (Signed)
Condition Code 44 Documentation Completed  Patient Details  Name: [name] MRN: [id number] Date of Birth: 01-Aug-1934   Condition Code 44 given:  Yes Patient signature on Condition Code 44 notice:  Yes Documentation of 2 MD's agreement:  Yes Code 44 added to claim:  Yes    Claudie Leach, RN 06/20/2017, 4:26 PM

## 2017-06-20 NOTE — ED Notes (Signed)
Dr Marthenia Rolling notified of pt troponin

## 2017-06-20 NOTE — ED Notes (Signed)
Label sent to main lab to add on bmp

## 2017-06-20 NOTE — ED Notes (Signed)
Heart Healthy lunch tray ordered

## 2017-06-20 NOTE — ED Notes (Signed)
Pt placed on hospital bed

## 2017-06-20 NOTE — ED Notes (Signed)
Pt ambulatory to restroom with steady gait.

## 2017-06-20 NOTE — Progress Notes (Signed)
PROGRESS NOTE    [name]  PJK:[id number] DOB: [birth year] DOA: 06/19/2017 PCP: Shirline Frees, MD  Outpatient Specialists:    Brief Narrative: Patient is an 83 year old African-American male with past medical history significant for congestive heart failure with an EF of 80-99%, grade 2 diastolic dysfunction, mild aortic stenosis, moderate aortic regurgitation, RVSP of 43 mmHg, coronary artery disease status post CABG and PCI, chronic kidney disease stage III, documented dementia and hypertension.  The patient was seen alongside patient's wife.  Patient is by particularly good historian.  Apparently, patient was admitted with shortness of breath.  With diuresis, the patient's symptoms have improved significantly.  However, patient has not back to his baseline.  Low potassium of 2.8 is noted.  Patient has been admitted for further assessment and management.  Assessment & Plan:   Active Problems:   Hypertensive heart disease with chronic combined systolic and diastolic congestive heart failure (HCC)   History of non-ST elevation myocardial infarction (NSTEMI)   Dementia   CAD (coronary artery disease)   CKD (chronic kidney disease) stage 3, GFR 30-59 ml/min (HCC)   CHF exacerbation (HCC)   Acute exacerbation of CHF (congestive heart failure) (HCC)  Shortness of breath: -Is likely secondary to the congestive heart failure exacerbation, suspect exacerbation of diastolic dysfunction. -We will continue cautious diuresis for now. -We will continue to monitor renal function and electrolytes. -Patient has allergies to ramipril (angioedema), will start patient on BiDil.  We will continue to monitor the patient's blood pressure closely.  Will monitor blood pressure closely. -Hopefully, the patient will be discharged in the next 24 hours..  Coronary artery disease: -This is stable. -No chest pain. -Troponin has remained stable.  Chronic kidney disease stage III. -The creatinine has gone up  from 1.14-1.74. -Continue to monitor renal function closely. -We will check renal function in the morning. -Will likely change IV diuretics to oral if the creatinine continues to go up  Congestive heart failure, combined, acute on chronic. -My guess is that the current episode is exacerbation of diastolic congestive heart failure. -Therefore, we will be careful with diuresis. -Currently see above as well.  Hypokalemia: -Repeat potassium is 3.9. -We will continue to monitor.  Essential hypertension. -This is optimize. -Currently, the patient is on Coreg, isosorbide mononitrate and diuresis. -We will add BiDil. - Monitor blood pressure closely.  DVT prophylaxis: Subcutaneous Lovenox. Code Status: Full. Family Communication: Wife. Disposition Plan: Home is eventually.   Consultants:   None.  Procedures:   None.  Antimicrobials:   None.   Subjective: No new complaints. No chest pain, no fever or chills. The patient has not back to his baseline.  Objective: Vitals:   06/20/17 1100 06/20/17 1200 06/20/17 1230 06/20/17 1307  BP: 111/67   124/63  Pulse: 65 78 64 71  Resp: 14 (!) 24 17 18   Temp:    98.6 F (37 C)  TempSrc:    Oral  SpO2: 97% 98% 97% 99%  Weight:    64.4 kg (141 lb 15.6 oz)  Height:    5\' 6"  (1.676 m)    Intake/Output Summary (Last 24 hours) at 06/20/2017 1407 Last data filed at 06/20/2017 0636 Gross per 24 hour  Intake -  Output 950 ml  Net -950 ml   Filed Weights   06/19/17 1420 06/20/17 1307  Weight: 64.4 kg (142 lb) 64.4 kg (141 lb 15.6 oz)    Examination:  General exam: Appears calm and comfortable  Respiratory system: Clear to auscultation.  Respiratory effort normal. Cardiovascular system: S1 & S2 heard, RRR. No JVD, murmurs, rubs, gallops or clicks. No pedal edema. Gastrointestinal system: Abdomen is nondistended, soft and nontender. No organomegaly or masses felt. Normal bowel sounds heard. Central nervous system: Alert and  oriented. No focal neurological deficits. Extremities: Symmetric 5 x 5 power.  Data Reviewed: I have personally reviewed following labs and imaging studies  CBC: Recent Labs  Lab 06/19/17 1424  WBC 4.8  HGB 12.3*  HCT 37.4*  MCV 87.0  PLT 229   Basic Metabolic Panel: Recent Labs  Lab 06/19/17 1424 06/20/17 0219  NA 141 139  K 3.8 2.8*  CL 101 112*  CO2 27 18*  GLUCOSE 106* 85  BUN 22* 18  CREATININE 1.60* 1.14  CALCIUM 9.2 6.6*   GFR: Estimated Creatinine Clearance: 46.6 mL/min (by C-G formula based on SCr of 1.14 mg/dL). Liver Function Tests: No results for input(s): AST, ALT, ALKPHOS, BILITOT, PROT, ALBUMIN in the last 168 hours. No results for input(s): LIPASE, AMYLASE in the last 168 hours. No results for input(s): AMMONIA in the last 168 hours. Coagulation Profile: No results for input(s): INR, PROTIME in the last 168 hours. Cardiac Enzymes: Recent Labs  Lab 06/20/17 0219 06/20/17 0730  TROPONINI <0.03 0.03*   BNP (last 3 results) Recent Labs    08/04/16 1558 08/20/16 0916  PROBNP 15,458* 6,789*   HbA1C: No results for input(s): HGBA1C in the last 72 hours. CBG: No results for input(s): GLUCAP in the last 168 hours. Lipid Profile: No results for input(s): CHOL, HDL, LDLCALC, TRIG, CHOLHDL, LDLDIRECT in the last 72 hours. Thyroid Function Tests: No results for input(s): TSH, T4TOTAL, FREET4, T3FREE, THYROIDAB in the last 72 hours. Anemia Panel: No results for input(s): VITAMINB12, FOLATE, FERRITIN, TIBC, IRON, RETICCTPCT in the last 72 hours. Urine analysis:    Component Value Date/Time   COLORURINE YELLOW 01/27/2015 1007   APPEARANCEUR CLOUDY (A) 01/27/2015 1007   LABSPEC 1.023 01/27/2015 1007   PHURINE 5.5 01/27/2015 1007   GLUCOSEU NEGATIVE 01/27/2015 1007   HGBUR MODERATE (A) 01/27/2015 1007   BILIRUBINUR NEGATIVE 01/27/2015 1007   KETONESUR 40 (A) 01/27/2015 1007   PROTEINUR 30 (A) 01/27/2015 1007   UROBILINOGEN 0.2 01/27/2015 1007    NITRITE NEGATIVE 01/27/2015 1007   LEUKOCYTESUR NEGATIVE 01/27/2015 1007   Sepsis Labs: @LABRCNTIP (procalcitonin:4,lacticidven:4)  )No results found for this or any previous visit (from the past 240 hour(s)).       Radiology Studies: Dg Chest 2 View  Result Date: 06/19/2017 CLINICAL DATA:  Worsening shortness of breath since yesterday. Chest pain. EXAM: CHEST  2 VIEW COMPARISON:  08/28/2015. FINDINGS: Stable mildly enlarged cardiac silhouette, post CABG changes and coronary artery stents. No significant change in small bilateral pleural effusions. Decreased left basilar atelectasis and interval mild patchy opacity at the right lung base in addition to previously demonstrated linear opacities. Unremarkable bones. IMPRESSION: 1. Interval increased right basilar atelectasis and possible pneumonia. 2. Decreased left basilar atelectasis. 3. Stable small bilateral pleural effusions and mild cardiomegaly. Electronically Signed   By: Claudie Revering M.D.   On: 06/19/2017 14:51        Scheduled Meds: . carvedilol  6.25 mg Oral BID WC  . clopidogrel  75 mg Oral Daily  . enoxaparin (LOVENOX) injection  40 mg Subcutaneous Q24H  . furosemide  40 mg Intravenous Daily  . isosorbide mononitrate  30 mg Oral Daily  . loratadine  10 mg Oral Daily   Continuous Infusions:   LOS:  1 day    Time spent: 45 minutes.    Dana Allan, MD  [facility] Pager #: [phone] 7PM-7AM contact night coverage as above

## 2017-06-21 DIAGNOSIS — I5043 Acute on chronic combined systolic (congestive) and diastolic (congestive) heart failure: Secondary | ICD-10-CM | POA: Diagnosis not present

## 2017-06-21 DIAGNOSIS — I110 Hypertensive heart disease with heart failure: Secondary | ICD-10-CM | POA: Diagnosis not present

## 2017-06-21 DIAGNOSIS — N183 Chronic kidney disease, stage 3 (moderate): Secondary | ICD-10-CM | POA: Diagnosis not present

## 2017-06-21 DIAGNOSIS — I2510 Atherosclerotic heart disease of native coronary artery without angina pectoris: Secondary | ICD-10-CM | POA: Diagnosis not present

## 2017-06-21 DIAGNOSIS — I5042 Chronic combined systolic (congestive) and diastolic (congestive) heart failure: Secondary | ICD-10-CM | POA: Diagnosis not present

## 2017-06-21 LAB — RENAL FUNCTION PANEL
Albumin: 3 g/dL — ABNORMAL LOW (ref 3.5–5.0)
Anion gap: 11 (ref 5–15)
BUN: 34 mg/dL — ABNORMAL HIGH (ref 6–20)
CO2: 25 mmol/L (ref 22–32)
Calcium: 9 mg/dL (ref 8.9–10.3)
Chloride: 102 mmol/L (ref 101–111)
Creatinine, Ser: 1.63 mg/dL — ABNORMAL HIGH (ref 0.61–1.24)
GFR calc Af Amer: 44 mL/min — ABNORMAL LOW (ref >60)
GFR calc non Af Amer: 38 mL/min — ABNORMAL LOW (ref >60)
Glucose, Bld: 95 mg/dL (ref 65–99)
Phosphorus: 3.6 mg/dL (ref 2.5–4.6)
Potassium: 4 mmol/L (ref 3.5–5.1)
Sodium: 138 mmol/L (ref 135–145)

## 2017-06-21 NOTE — Discharge Summary (Signed)
Physician Discharge Summary  [name] MBW:[id number] DOB: Sep 26, 1934 DOA: 06/19/2017  PCP: Shirline Frees, MD  Admit date: 06/19/2017 Discharge date: 06/21/2017  Time spent: 45 minutes  Recommendations for Outpatient Follow-up:  1. PCP Dr. Kenton Kingfisher in 1 week 2. Cardiology [name] PA-C  in 2 weeks   Discharge Diagnoses:  Active Problems:    Acute on chronic combined systolic and diastolic congestive heart failure (HCC)   History of non-ST elevation myocardial infarction (NSTEMI)   Dementia   CAD (coronary artery disease)   CKD (chronic kidney disease) stage 3, GFR 30-59 ml/min (HCC)   CHF exacerbation (HCC)   Acute exacerbation of CHF (congestive heart failure) ([place])   Discharge Condition: Stable  Diet recommendation: Low sodium heart healthy  Filed Weights   06/19/17 1420 06/20/17 1307 06/21/17 0459  Weight: 64.4 kg (142 lb) 64.4 kg (141 lb 15.6 oz) 61.2 kg (134 lb 14.7 oz)    History of present illness:  83 year old African-American male with past medical history significant for congestive heart failure with an EF of 01-77%, grade 2 diastolic dysfunction, mild aortic stenosis, moderate aortic regurgitation, RVSP of 43 mmHg, coronary artery disease status post CABG and PCI, chronic kidney disease stage III, documented dementia and hypertension.  patient was admitted with shortness of breath, [place] Hospital Course:  1. Acute on chronic systolic and diastolic CHF -Most likely secondary to dietary discretion and poor compliance with a low sodium diet -Last 2-D echocardiogram within 1 year with EF of 35% and grade 2 diastolic dysfunction -Diuresed well with IV Lasix, -3.4 L  -Appears euvolemic at this time, transitioned to oral Lasix 40 mg daily at discharge -Coreg continued -Advised importance of low-salt diet with patient and wife, for health literacy continues to be a concern -Did not start ACE inhibitor due to renal insufficiency  2. Chronic kidney  disease stage III -Baseline creatinine around 1.5 -Creatinine is 1.6 at discharge today  3. Hypertension -Stable, continue Coreg Imdur and Lasix  4. Mild dementia -Stable  5. History of CAD -Stable, troponin unremarkable -Continue Coreg and Plavix  Discharge Exam: Vitals:   06/21/17 0459 06/21/17 0800  BP: 134/71 133/70  Pulse: 67 62  Resp:  18  Temp: (!) 97.4 F (36.3 C) (!) 97.3 F (36.3 C)  SpO2: 95% 97%    General: AAOx2 Cardiovascular: S1S2/RRR Respiratory: CTAB  Discharge Instructions   Discharge Instructions    Diet - low sodium heart healthy   Complete by:  As directed    Increase activity slowly   Complete by:  As directed      Allergies as of 06/21/2017      Reactions   Ramipril Swelling, Other (See Comments)   Angioedema   Latex Itching, Rash   Statins Other (See Comments)   Elevated LFTs with statins in the past      Medication List    TAKE these medications   carvedilol 6.25 MG tablet Commonly known as:  COREG take 1 tablet by mouth twice a day with meals What changed:    how much to take  how to take this  when to take this   CLARITIN 10 MG tablet Generic drug:  loratadine Take 10 mg by mouth daily.   clopidogrel 75 MG tablet Commonly known as:  PLAVIX take 1 tablet by mouth once daily What changed:    how much to take  how to take this  when to take this   furosemide 40 MG tablet  Commonly known as:  LASIX Take 40 mg by mouth daily.   isosorbide mononitrate 30 MG 24 hr tablet Commonly known as:  IMDUR Take 1 tablet (30 mg total) by mouth daily.   nitroGLYCERIN 0.4 MG SL tablet Commonly known as:  NITROSTAT place 1 tablet under the tongue if needed every 5 minutes for chest pain  UP TO 3 DOSES   PROAIR HFA 108 (90 Base) MCG/ACT inhaler Generic drug:  albuterol Inhale 2 puffs into the lungs every six hours as needed for shortness of breath or wheezing      Allergies  Allergen Reactions  . Ramipril Swelling  and Other (See Comments)    Angioedema   . Latex Itching and Rash  . Statins Other (See Comments)    Elevated LFTs with statins in the past   Follow-up Information    Shirline Frees, MD. Schedule an appointment as soon as possible for a visit in 1 week(s).   Specialty:  Family Medicine Contact information: [place] [address] [phone]            The results of significant diagnostics from this hospitalization (including imaging, microbiology, ancillary and laboratory) are listed below for reference.    Significant Diagnostic Studies: Dg Chest 2 View  Result Date: 06/19/2017 CLINICAL DATA:  Worsening shortness of breath since yesterday. Chest pain. EXAM: CHEST  2 VIEW COMPARISON:  08/28/2015. FINDINGS: Stable mildly enlarged cardiac silhouette, post CABG changes and coronary artery stents. No significant change in small bilateral pleural effusions. Decreased left basilar atelectasis and interval mild patchy opacity at the right lung base in addition to previously demonstrated linear opacities. Unremarkable bones. IMPRESSION: 1. Interval increased right basilar atelectasis and possible pneumonia. 2. Decreased left basilar atelectasis. 3. Stable small bilateral pleural effusions and mild cardiomegaly. Electronically Signed   By: Claudie Revering M.D.   On: 06/19/2017 14:51    Microbiology: No results found for this or any previous visit (from the past 240 hour(s)).   Labs: Basic Metabolic Panel: Recent Labs  Lab 06/19/17 1424 06/20/17 0219 06/20/17 1429 06/21/17 0607  NA 141 139 138 138  K 3.8 2.8* 3.9 4.0  CL 101 112* 100* 102  CO2 27 18* 24 25  GLUCOSE 106* 85 135* 95  BUN 22* 18 28* 34*  CREATININE 1.60* 1.14 1.74* 1.63*  CALCIUM 9.2 6.6* 9.0 9.0  PHOS  --   --  3.9 3.6   Liver Function Tests: Recent Labs  Lab 06/20/17 1429 06/21/17 0607  ALBUMIN 3.1* 3.0*   No results for input(s): LIPASE, AMYLASE in the last 168 hours. No results  for input(s): AMMONIA in the last 168 hours. CBC: Recent Labs  Lab 06/19/17 1424  WBC 4.8  HGB 12.3*  HCT 37.4*  MCV 87.0  PLT 154   Cardiac Enzymes: Recent Labs  Lab 06/20/17 0219 06/20/17 0730 06/20/17 1429  TROPONINI <0.03 0.03* 0.03*   BNP: BNP (last 3 results) Recent Labs    06/19/17 1424  BNP 1,224.9*    ProBNP (last 3 results) Recent Labs    08/04/16 1558 08/20/16 0916  PROBNP 15,458* 2,563*    CBG: No results for input(s): GLUCAP in the last 168 hours.     Signed:  Domenic Polite MD.  [facility] 06/21/2017, 10:46 AM

## 2017-06-21 NOTE — Progress Notes (Signed)
Patient and wife given discharge instructions and all questions answered.

## 2017-06-21 NOTE — Progress Notes (Signed)
Patient ambulated entire hallway with walker.  Patient oxygen saturation 94-96%.

## 2017-06-24 NOTE — Progress Notes (Signed)
Cardiology Office Note   Date:  06/25/2017   ID:  [name], DOB Jan 16, 1935, MRN [id number]  PCP:  Shirline Frees, MD  Cardiologist:  Dr. END    Chief Complaint  Patient presents with  . Hospitalization Follow-up    post HF   . GI Bleeding      History of Present Illness: [name] is a 83 y.o. male who presents for post hospitalization for acute on chronic systolic and diastolic HF.    Hx of CAD, s/p CABG in 2003, aortic insufficiency, HTN, HL, carotid artery disease.He has been nonadherent to follow-up and medications in the past. He has a history of elevated LFTs and total CK on statin therapy in the past.  He had a NSTEMI in 9/16 and Cardiac Catheterization demonstrated severe 3 vessel CAD with a chronically occluded LCx, RCA and severe subtotal occlusion of the native mid LAD, chronically occluded SVG-RCA and SVG-LAD with severe stenosis and SVG-OM. He underwent PCI with DES 2 in overlapping fashion to the native LAD as well as DES to the SVG-OM. Echocardiogram demonstrated an EF of 25-30%, mild AS, moderate AI and mild MR. FU echo in 1/17 demonstrated improved EF 40-45%.Because ofepistaxis, we changed his Brilinta to Plavix. He was last seen 4/18.  On last visit 10/2016 his imdur was increased to 30 mg daily, was unable to take ARB due to worsening renal function, and he is allergic to ACE.  His ASA was stopped and plavix was continued.  This was due to bleeding but with multiple stents plavix was felt best therapy.  Pt has refused statins.  Recent hospitalization due to acute on chronic HF.  His BNP was elevated at 1224.  He was diuresed   Troponin pked at 0.03   Cr was 1.63 at discharge.  K+4.0  HF was thoought to be due to dietary indiscretion.  At discharge he was neg 3.4 L .   Today no SOB, no swelling.  Feeling better,  He did have chest pain today on exercise bike, his wife stated he was going very fast.  He stopped and pain improved.  None now.  We discussed  going slower on bike.  But if continued chest pain he should call us, the prior stents could be blocking.     Past Medical History:  Diagnosis Date  . CAD (coronary artery disease)    a. CABG (2003)  b. 9/17/6: NSTEMI s/p overlapping DES x2 to LAD and DESx1 to SVG-->OM   . Carotid artery disease ([place])    a. 09/2013: carotid dopplers with stable 1-39% ICA stenosis bilaterally  //  b. Carotid US 4/33: RICA 29-51%; LICA 8-84% >> FU 1 year // c. Carotid US 09/2016: Bilateral ICA 1-39 >> follow-up 1 year  . CHF (congestive heart failure) ([place])   . Dementia   . History of echocardiogram    Echo 4/18: mild conc LVH, EF 35-40, diff HK, anteroseptal and inf-septal HK, Gr 2 DD, mild AS (mean 13), mod AI, mildly dilated asc Ao (43 mm), mod MAC, mod MR, severe LAE, mild TR, mod PI, PASP 43  . Hyperlipidemia   . Hypertension   . Internal hemorrhoids   . Ischemic cardiomyopathy    a. 2D ECHO 01/28/15 w/ EF 25-30%, severe HK of the basal-midinferoseptal myocardium, basal-midinferior myocardium and midanteroseptal myocardium. Akinesis of apical inferior and apicalanterior myocardium. G2DD. Mild AS, Asc aorta diameter 80m. Mild dilation of aortic root. Mild MR, mild PR and PA peak pressure 36  mmg HG.  . Peptic ulcer disease   . Prostate CA [facility])    a. s/p seed implants    Past Surgical History:  Procedure Laterality Date  . CARDIAC CATHETERIZATION N/A 01/27/2015   Procedure: Left Heart Cath and Coronary Angiography;  Surgeon: Wellington Hampshire, MD;  Location: [place] [facility];  Service: Cardiovascular;  Laterality: N/A;  . CARDIAC SURGERY  2003   SVG LAD, SVG to right coronary artery, SVG to marginal     Current Outpatient Medications  Medication Sig Dispense Refill  . carvedilol (COREG) 6.25 MG tablet take 1 tablet by mouth twice a day with meals (Patient taking differently: Take 1 tablet (6.25 mg) by mouth two times a day meals) 180 tablet 3  . clopidogrel (PLAVIX) 75 MG tablet take 1 tablet by  mouth once daily (Patient taking differently: Take 1 tablet (75 mg) by mouth once a day) 90 tablet 3  . furosemide (LASIX) 40 MG tablet Take 40 mg by mouth daily.    Marland Kitchen loratadine (CLARITIN) 10 MG tablet Take 10 mg by mouth daily.    . nitroGLYCERIN (NITROSTAT) 0.4 MG SL tablet place 1 tablet under the tongue if needed every 5 minutes for chest pain  UP TO 3 DOSES 25 tablet 6  . PROAIR HFA 108 (90 Base) MCG/ACT inhaler Inhale 2 puffs into the lungs every six hours as needed for shortness of breath or wheezing  0  . isosorbide mononitrate (IMDUR) 30 MG 24 hr tablet Take 1 tablet (30 mg total) by mouth daily. 90 tablet 3   No current facility-administered medications for this visit.     Allergies:   Ramipril; Latex; and Statins    Social History:  The patient  reports that he has quit smoking. he has never used smokeless tobacco. He reports that he does not drink alcohol or use drugs.   Family History:  The patient's family history includes Stroke in his father.    ROS:  General:no colds or fevers, no weight changes since discharge Skin:no rashes or ulcers HEENT:no blurred vision, no congestion CV:see HPI PUL:see HPI GI:no diarrhea constipation or melena, no indigestion GU:no hematuria, no dysuria MS:no joint pain, no claudication Neuro:no syncope, no lightheadedness Endo:no diabetes, no thyroid disease  Wt Readings from Last 3 Encounters:  06/25/17 134 lb 9.6 oz (61.1 kg)  06/21/17 134 lb 14.7 oz (61.2 kg)  10/31/16 141 lb (64 kg)     PHYSICAL EXAM: VS:  BP 130/60   Pulse 60   Ht _0  (1.676 m)   Wt 134 lb 9.6 oz (61.1 kg)   BMI 21.73 kg/m  , BMI Body mass index is 21.73 kg/m. General:Pleasant affect, NAD Skin:Warm and dry, brisk capillary refill HEENT:normocephalic, sclera clear, mucus membranes moist Neck:supple, no JVD, no bruits  Heart:S1S2 RRR with 2/6 systolic murmur, no gallup, rub or click Lungs:clear without rales, rhonchi, or wheezes VOJ:JKKX, non tender, +  BS, do not palpate liver spleen or masses Ext:no lower ext edema, 2+ pedal pulses, 2+ radial pulses Neuro:alert and oriented X 3, MAE, follows commands, + facial symmetry    EKG:  EKG is NOT ordered today. EKGs from hospital with LVH and increased T wave inversion laterally    Recent Labs: 08/20/2016: NT-Pro BNP 3,818 06/19/2017: B Natriuretic Peptide 1,224.9; Hemoglobin 12.3; Platelets 154 06/21/2017: BUN 34; Creatinine, Ser 1.63; Potassium 4.0; Sodium 138    Lipid Panel    Component Value Date/Time   CHOL 177 07/27/2015 0845  TRIG 117 07/27/2015 0845   HDL 26 (L) 07/27/2015 0845   CHOLHDL 6.8 (H) 07/27/2015 0845   VLDL 23 07/27/2015 0845   LDLCALC 128 07/27/2015 0845       Other studies Reviewed: Additional studies/ records that were reviewed today include: . Carotid US 5/18 bilat ICA 1-39 >> f/u 1 year  Echo 08/20/16 Mild conc LVH, EF 35-40, ant-sept and inf-sept HK, Gr 2 DD, mild AS (mean 13), mod AI, mild dilated asc aorta (43 mm), MAC, mod MR, severe LAE, mild TR, mod PI, PASP 43  Carotid US 6/29:  RICA 47-65%; LICA 4-65% >> FU 1 year  Echo 05/31/15 Mild LVH, EF 40-45%, inf-septal HK, apical inf-septal AK, Gr 1 DD, mild AI, Aortic Sclerosis without stenosis (mean 15 mmHg), mod MAC, mild to mod reduced RVSF  Echo 01/28/15 Inferoseptal HK, inferior HK, apical AK, anteroseptal HK, moderate LVH, EF 03-54% grade 2 diastolic dysfunction, mild aortic stenosis (mean 9 mmHg), moderate AI, mildly dilated aortic root, ascending aortic diameter 40 mm (mildly dilated), mild MR, mild LAE, mild PI, PASP 36 mmHg  LHC 01/27/15 LM: 40% LAD: Mid 99%, 99% >> PCI: Synergy DES x 2 (overlapping) LCx: 100% RCA: 100% SVG-LAD: 100% SVG-OM2 90% >> PCI: Synergy DES SVG-RCA 100% EF 25-35%, 4+ MR  Abd Aorta US 09/19/13 No AAA  Carotid US 09/19/13 Bilateral ICA 1-39% FU 2 years (due in 09/2015)     ASSESSMENT AND PLAN:  1.  Chronic systolic and diastolic HF.  Euvolemic today  wt is stable - he has been adjusting his diet cutting out pizza and sausage.    He is on lasix 40 mg daily.  Will check BMP today.   Consider repeat Echo if recurrent symptoms.    2.  Angina while riding stationary bike - resolved with rest.  His wife felt he was pushing too hard.  Discussed if pain returned to call us, I increased imdur to 45 mg daily.  He has NTG prn as well.  Will have him follow up with Dr. Saunders Revel in 1-2 months, though instructed to call if further episodes of pain.    3.  Hx of GI bleed but no further episodes.  Continue Plavix alone no ASA as stents > 2 years ago and hx of bleeding.  Note: wife believes meds related to meds from [facility] so we are sending. [facility].  Please note the bleeding was from the spring of 2018.    4.  CAD with hx of stents.  See above angina  5.  HTN controlled on lasix and coreg and imdur.    6.  Bil. Carotid disease will need carotids in May 2019  Current medicines are reviewed with the patient today.  The patient Has no concerns regarding medicines.  The following changes have been made:  See above Labs/ tests ordered today include:see above  Disposition:   FU:  see above  Signed, Cecilie Kicks, NP  06/25/2017 9:22 AM    [facility], [place], [place], [place] Phone: [phone]; Fax: [phone]

## 2017-06-25 ENCOUNTER — Encounter: Payer: Self-pay | Admitting: Cardiology

## 2017-06-25 ENCOUNTER — Ambulatory Visit: Payer: Medicare Other | Admitting: Cardiology

## 2017-06-25 VITALS — BP 130/60 | HR 60 | Ht 66.0 in | Wt 134.6 lb

## 2017-06-25 DIAGNOSIS — N183 Chronic kidney disease, stage 3 unspecified: Secondary | ICD-10-CM

## 2017-06-25 DIAGNOSIS — I110 Hypertensive heart disease with heart failure: Secondary | ICD-10-CM | POA: Diagnosis not present

## 2017-06-25 DIAGNOSIS — I6523 Occlusion and stenosis of bilateral carotid arteries: Secondary | ICD-10-CM

## 2017-06-25 DIAGNOSIS — I2510 Atherosclerotic heart disease of native coronary artery without angina pectoris: Secondary | ICD-10-CM | POA: Diagnosis not present

## 2017-06-25 DIAGNOSIS — I209 Angina pectoris, unspecified: Secondary | ICD-10-CM | POA: Diagnosis not present

## 2017-06-25 DIAGNOSIS — I5042 Chronic combined systolic (congestive) and diastolic (congestive) heart failure: Secondary | ICD-10-CM | POA: Diagnosis not present

## 2017-06-25 LAB — BASIC METABOLIC PANEL
BUN/Creatinine Ratio: 25 — ABNORMAL HIGH (ref 10–24)
BUN: 43 mg/dL — ABNORMAL HIGH (ref 8–27)
CHLORIDE: 100 mmol/L (ref 96–106)
CO2: 25 mmol/L (ref 20–29)
Calcium: 9.1 mg/dL (ref 8.6–10.2)
Creatinine, Ser: 1.7 mg/dL — ABNORMAL HIGH (ref 0.76–1.27)
GFR calc non Af Amer: 37 mL/min/{1.73_m2} — ABNORMAL LOW (ref >59)
GFR, EST AFRICAN AMERICAN: 43 mL/min/{1.73_m2} — AB (ref >59)
Glucose: 101 mg/dL — ABNORMAL HIGH (ref 65–99)
POTASSIUM: 4.5 mmol/L (ref 3.5–5.2)
Sodium: 140 mmol/L (ref 134–144)

## 2017-06-25 MED ORDER — ISOSORBIDE MONONITRATE ER 30 MG PO TB24: 45.0000 mg | ORAL_TABLET | Freq: Every day | ORAL | 3 refills | Status: DC

## 2017-06-25 MED ORDER — CLOPIDOGREL BISULFATE 75 MG PO TABS: 75.0000 mg | ORAL_TABLET | Freq: Every day | ORAL | 3 refills | Status: DC

## 2017-06-25 MED ORDER — NITROGLYCERIN 0.4 MG SL SUBL: SUBLINGUAL_TABLET | SUBLINGUAL | 6 refills | Status: DC

## 2017-06-25 NOTE — Patient Instructions (Addendum)
Medication Instructions:  Your physician has recommended you make the following change in your medication:  1.  INCREASE the Imdur to 1 1/2 tablet daily (30 mg tablet = 45 mg)   Labwork: TODAY:  BMET  Testing/Procedures: None ordered  Follow-Up: Your physician recommends that you schedule a follow-up appointment in: 1-2 MONTHS WITH DR. END   Any Other Special Instructions Will Be Listed Below (If Applicable).     If you need a refill on your cardiac medications before your next appointment, please call your pharmacy.

## 2017-06-26 ENCOUNTER — Telehealth: Payer: Self-pay | Admitting: Internal Medicine

## 2017-06-26 NOTE — Telephone Encounter (Signed)
New message    Pt c/o medication issue:  1. Name of Medication: clopidogrel   2. How are you currently taking this medication (dosage and times per day)? 75 mg  3. Are you having a reaction (difficulty breathing--STAT)? no  4. What is your medication issue? Pt wife states that pt can no longer get this medication from pharmacy because [employer] was bought by [employer]. They have some but she said they were no good, because they came from a different manufacturer and pt can't take them because he has a reaction.

## 2017-06-26 NOTE — Telephone Encounter (Signed)
Left message to call back  

## 2017-06-29 ENCOUNTER — Telehealth: Payer: Self-pay | Admitting: *Deleted

## 2017-06-29 DIAGNOSIS — Z79899 Other long term (current) drug therapy: Secondary | ICD-10-CM

## 2017-06-29 MED ORDER — FUROSEMIDE 40 MG PO TABS: ORAL_TABLET | ORAL | 3 refills | Status: DC

## 2017-06-29 NOTE — Telephone Encounter (Signed)
-----   Message from Isaiah Serge, NP sent at 06/26/2017  8:13 AM EST ----- Decrease lasix to 20 mg daily and recheck BMP in 1 week,  Kidneys stressed a little.   Weigh daily if wt increase by 3 lbs in a day take an extra 20 mg lasix.

## 2017-06-30 NOTE — Telephone Encounter (Signed)
Left message for patient to call back  

## 2017-07-01 NOTE — Telephone Encounter (Signed)
Spoke with patient's wife about medication and pharmacy concerns..  Everything is all straightened out and patient's wife verbalizes understanding

## 2017-07-03 ENCOUNTER — Other Ambulatory Visit: Payer: Self-pay

## 2017-07-03 ENCOUNTER — Other Ambulatory Visit: Payer: Medicare Other | Admitting: *Deleted

## 2017-07-03 DIAGNOSIS — N183 Chronic kidney disease, stage 3 unspecified: Secondary | ICD-10-CM

## 2017-07-03 DIAGNOSIS — Z79899 Other long term (current) drug therapy: Secondary | ICD-10-CM

## 2017-07-03 LAB — BASIC METABOLIC PANEL
BUN/Creatinine Ratio: 28 — ABNORMAL HIGH (ref 10–24)
BUN: 49 mg/dL — ABNORMAL HIGH (ref 8–27)
CALCIUM: 9.3 mg/dL (ref 8.6–10.2)
CHLORIDE: 100 mmol/L (ref 96–106)
CO2: 25 mmol/L (ref 20–29)
Creatinine, Ser: 1.78 mg/dL — ABNORMAL HIGH (ref 0.76–1.27)
GFR calc Af Amer: 41 mL/min/{1.73_m2} — ABNORMAL LOW (ref >59)
GFR calc non Af Amer: 35 mL/min/{1.73_m2} — ABNORMAL LOW (ref >59)
GLUCOSE: 83 mg/dL (ref 65–99)
POTASSIUM: 4.5 mmol/L (ref 3.5–5.2)
Sodium: 140 mmol/L (ref 134–144)

## 2017-07-17 ENCOUNTER — Other Ambulatory Visit: Payer: Medicare Other | Admitting: *Deleted

## 2017-07-17 DIAGNOSIS — N183 Chronic kidney disease, stage 3 unspecified: Secondary | ICD-10-CM

## 2017-07-18 LAB — BASIC METABOLIC PANEL
BUN/Creatinine Ratio: 18 (ref 10–24)
BUN: 30 mg/dL — ABNORMAL HIGH (ref 8–27)
CALCIUM: 9 mg/dL (ref 8.6–10.2)
CO2: 24 mmol/L (ref 20–29)
CREATININE: 1.63 mg/dL — AB (ref 0.76–1.27)
Chloride: 99 mmol/L (ref 96–106)
GFR, EST AFRICAN AMERICAN: 45 mL/min/{1.73_m2} — AB (ref >59)
GFR, EST NON AFRICAN AMERICAN: 39 mL/min/{1.73_m2} — AB (ref >59)
Glucose: 81 mg/dL (ref 65–99)
POTASSIUM: 3.9 mmol/L (ref 3.5–5.2)
Sodium: 137 mmol/L (ref 134–144)

## 2017-07-24 ENCOUNTER — Encounter: Payer: Self-pay | Admitting: *Deleted

## 2017-08-03 ENCOUNTER — Encounter: Payer: Self-pay | Admitting: Internal Medicine

## 2017-08-03 ENCOUNTER — Ambulatory Visit: Payer: Medicare Other | Admitting: Internal Medicine

## 2017-08-03 VITALS — BP 126/60 | HR 78 | Ht 66.0 in | Wt 148.8 lb

## 2017-08-03 DIAGNOSIS — K625 Hemorrhage of anus and rectum: Secondary | ICD-10-CM | POA: Diagnosis not present

## 2017-08-03 DIAGNOSIS — I25118 Atherosclerotic heart disease of native coronary artery with other forms of angina pectoris: Secondary | ICD-10-CM

## 2017-08-03 DIAGNOSIS — I6523 Occlusion and stenosis of bilateral carotid arteries: Secondary | ICD-10-CM | POA: Diagnosis not present

## 2017-08-03 DIAGNOSIS — N183 Chronic kidney disease, stage 3 unspecified: Secondary | ICD-10-CM

## 2017-08-03 DIAGNOSIS — N4889 Other specified disorders of penis: Secondary | ICD-10-CM

## 2017-08-03 DIAGNOSIS — I10 Essential (primary) hypertension: Secondary | ICD-10-CM | POA: Diagnosis not present

## 2017-08-03 DIAGNOSIS — I5042 Chronic combined systolic (congestive) and diastolic (congestive) heart failure: Secondary | ICD-10-CM | POA: Diagnosis not present

## 2017-08-03 MED ORDER — ASPIRIN EC 81 MG PO TBEC: 81.0000 mg | DELAYED_RELEASE_TABLET | Freq: Every day | ORAL | Status: AC

## 2017-08-03 MED ORDER — ISOSORBIDE MONONITRATE ER 30 MG PO TB24: 30.0000 mg | ORAL_TABLET | Freq: Every day | ORAL | 3 refills | Status: DC

## 2017-08-03 MED ORDER — FUROSEMIDE 40 MG PO TABS: 40.0000 mg | ORAL_TABLET | Freq: Every day | ORAL | 3 refills | Status: DC

## 2017-08-03 NOTE — Patient Instructions (Addendum)
Medication Instructions:  Stop Plavix  Start Aspirin 81 mg daily by mouth  Take Imdur 30 mg daily by mouth  Take Lasix 40 mg daily by mouth     -- If you need a refill on your cardiac medications before your next appointment, please call your pharmacy. --  Labwork: CBC BMP  Testing/Procedures: None ordered  Follow-Up: Your physician wants you to follow-up in: 1-2 weeks with Dr. Saunders Revel or APP     Thank you for choosing [facility]!!    Any Other Special Instructions Will Be Listed Below (If Applicable).

## 2017-08-03 NOTE — Progress Notes (Signed)
Follow-up Outpatient Visit Date: 08/03/2017  Primary Care Provider: Shirline Frees, MD [address] [place] [address]  Chief Complaint: Problem with Plavix  HPI:  Mr. [name] is an 83 y.o. year-old male with history of coronary artery disease s/p CABG (7412), chronic systolic and diastolic heart failure, aortic regurgitation, hypertension, hyperlipidemia, carotid artery stenosis, , and chronic kidney disease stage 3, who presents for follow-up of heart failure. He was previously followed in our practice by Dr. Percival Spanish and was most recently seen by Cecilie Kicks, NP, last month. His last catheterization in 2016 showed severe disease, including chronically occluded LCx, RCA and severe subtotal occlusion of the native mid LAD, chronically occluded SVG-RCA and SVG-LAD with severe stenosis and SVG-OM. He underwent PCI with DES 2 in overlapping fashion to the native LAD as well as DES to the SVG-OM. Echocardiogram demonstrated an EF of 25-30%, mild AS, moderate AI and mild MR. FU echo in 1/17 demonstrated improved EF 40-45%.Because ofepistaxis, we changed his Brilinta to Plavix. Aspirin was stopped earlier this year due to GI bleeding. He was hospitalized in early February with decompensated heart failure. At the time of his follow-up with Cecilie Kicks on 06/25/17, he was not longer short of breath. He noted chest pain while riding his exercise bike. His isosorbide mononitrate was increased to 45 mg daily. However, it turns out that he has only been taking 15 mg daily. Fortunately, he has not had any chest pain.  Overall, Mr. Scalise feels well, denying shortness of breath, orthopnea, and PND. He had one episode of rectal and penile bleeding last week after picking up a new clopidogrel prescription. He has not had any further bleeding in the last 5 days. He reports stable weight at home, though he does not weigh himself every day. He does not follow a low-salt diet every day. He notes some  edema but feels that it may be due to not elevating his legs much today.  --------------------------------------------------------------------------------------------------  Past Medical History:  Diagnosis Date  . CAD (coronary artery disease)    a. CABG (2003)  b. 9/17/6: NSTEMI s/p overlapping DES x2 to LAD and DESx1 to SVG-->OM   . Carotid artery disease ([place])    a. 09/2013: carotid dopplers with stable 1-39% ICA stenosis bilaterally  //  b. Carotid US 8/78: RICA 67-67%; LICA 2-09% >> FU 1 year // c. Carotid US 09/2016: Bilateral ICA 1-39 >> follow-up 1 year  . CHF (congestive heart failure) ([place])   . Dementia   . History of echocardiogram    Echo 4/18: mild conc LVH, EF 35-40, diff HK, anteroseptal and inf-septal HK, Gr 2 DD, mild AS (mean 13), mod AI, mildly dilated asc Ao (43 mm), mod MAC, mod MR, severe LAE, mild TR, mod PI, PASP 43  . Hyperlipidemia   . Hypertension   . Internal hemorrhoids   . Ischemic cardiomyopathy    a. 2D ECHO 01/28/15 w/ EF 25-30%, severe HK of the basal-midinferoseptal myocardium, basal-midinferior myocardium and midanteroseptal myocardium. Akinesis of apical inferior and apicalanterior myocardium. G2DD. Mild AS, Asc aorta diameter 93m. Mild dilation of aortic root. Mild MR, mild PR and PA peak pressure 36 mmg HG.  . Peptic ulcer disease   . Prostate CA ([facility]    a. s/p seed implants   Past Surgical History:  Procedure Laterality Date  . CARDIAC CATHETERIZATION N/A 01/27/2015   Procedure: Left Heart Cath and Coronary Angiography;  Surgeon: MWellington Hampshire MD;  Location: [place];  Service: Cardiovascular;  Laterality: N/A;  . CARDIAC SURGERY  2003   SVG LAD, SVG to right coronary artery, SVG to marginal    Allergies: Ramipril; Statins; and Latex  Social History   Socioeconomic History  . Marital status: Married    Spouse name: Not on file  . Number of children: Not on file  . Years of education: Not on file  . Highest education level:  Not on file  Occupational History  . Occupation: Retired  Scientific laboratory technician  . Financial resource strain: Not on file  . Food insecurity:    Worry: Not on file    Inability: Not on file  . Transportation needs:    Medical: Not on file    Non-medical: Not on file  Tobacco Use  . Smoking status: Former Research scientist (life sciences)  . Smokeless tobacco: Never Used  Substance and Sexual Activity  . Alcohol use: No  . Drug use: No  . Sexual activity: Not on file  Lifestyle  . Physical activity:    Days per week: Not on file    Minutes per session: Not on file  . Stress: Not on file  Relationships  . Social connections:    Talks on phone: Not on file    Gets together: Not on file    Attends religious service: Not on file    Active member of club or organization: Not on file    Attends meetings of clubs or organizations: Not on file    Relationship status: Not on file  . Intimate partner violence:    Fear of current or ex partner: Not on file    Emotionally abused: Not on file    Physically abused: Not on file    Forced sexual activity: Not on file  Other Topics Concern  . Not on file  Social History Narrative  . Not on file    Family History  Problem Relation Age of Onset  . Stroke Father     Review of Systems: A 12-system review of systems was performed and was negative except as noted in the HPI.  --------------------------------------------------------------------------------------------------  Physical Exam: BP 126/60   Pulse 78   Ht _0  (1.676 m)   Wt 148 lb 12.8 oz (67.5 kg)   SpO2 98%   BMI 24.02 kg/m   General:  Frail, elderly man, seated comfortably on the exam table. He is accompanied by his wife. HEENT: Mild conjunctival pallor noted. Moist mucous membranes.  OP clear. Neck: Supple without lymphadenopathy, thyromegaly, JVD, or HJR. Lungs: Normal work of breathing. Moderately diminished breath sounds throughout. No wheezes or crackles. Heart: Regular rate and rhythm with  2/6 systolic murmur loudest at the apex. No rubs or gallops. Abd: Bowel sounds present. Soft, NT/ND without hepatosplenomegaly Ext: 2+ pitting edema to the proximal calves bilaterally. 2+ radial pulses. Skin: Warm and dry without rash.   Lab Results  Component Value Date   WBC 4.8 06/19/2017   HGB 12.3 (L) 06/19/2017   HCT 37.4 (L) 06/19/2017   MCV 87.0 06/19/2017   PLT 154 06/19/2017    Lab Results  Component Value Date   NA 137 07/17/2017   K 3.9 07/17/2017   CL 99 07/17/2017   CO2 24 07/17/2017   BUN 30 (H) 07/17/2017   CREATININE 1.63 (H) 07/17/2017   GLUCOSE 81 07/17/2017   ALT 13 07/27/2015    Lab Results  Component Value Date   CHOL 177 07/27/2015   HDL 26 (L) 07/27/2015  [employer] 128 07/27/2015   TRIG 117 07/27/2015   CHOLHDL 6.8 (H) 07/27/2015    --------------------------------------------------------------------------------------------------  ASSESSMENT AND PLAN: Coronary artery disease with stable angina No further episodes of chest pain despite Mr. Delauter inadvertantly decreasing isosorbide mononitrate to 15 mg daily rather than increasing it to 45 mg daily after his last visit. We will have him resume 30 mg daily and continue carvedilol 6.25 mg BID for antianginal therapy. Given history of GI bleeding and recent report of brief rectal/penile bleeding, I think it would be best to discontinue clopidogrel and start aspirin 81 mg daily.  Chronic systolic and diastolic heart failure Though Mr. Benningfield denies symptoms of heart failure, his weight is up 14 pounds since last month. He also has 2+ pretibial edema bilaterally. I will check a BMP today and increase furosemide to 40 mg daily. I encouraged Mr. Scorza to minimize his salt intake, though it sounds like he eats premade foods (pizza, hot dog, etc.) several times a week.  Hypertension Blood pressure is well controlled today. We will increase isosorbide mononitrate back to 30 mg daily.  Rectal and penile  bleeding Mr. Krieger has a history of GI bleeding and prostate cancer. His wife is concerned that a recent change in manufacturer of his clopidogrel caused the bleeding. I am concerned that Mr. Brod is high-risk for bleeding regardless. We discussed the risks and benefits on continuing clopidogrel verus switching to aspirin and have agreed to the latter. I will check a CBC today to ensure that he does not have worsening anemia. I advised him to contact his PCP or go to the ED if he has any further bleeding.  Carotid artery stenosis No symptoms. Annual follow-up carotid Doppler previously ordered.  Chronic kidney disease stage 3 Mr. Bannan is clearly retaining fluid today. I will increase furosemide, as above, and check a BMP today.  Follow-up: Return to clinic in 1-2 weeks. He will need repeat BMP at that time.  Nelva Bush, MD 08/03/2017 8:35 AM

## 2017-08-04 LAB — BASIC METABOLIC PANEL
BUN/Creatinine Ratio: 22 (ref 10–24)
BUN: 38 mg/dL — AB (ref 8–27)
CALCIUM: 8.7 mg/dL (ref 8.6–10.2)
CHLORIDE: 105 mmol/L (ref 96–106)
CO2: 24 mmol/L (ref 20–29)
Creatinine, Ser: 1.71 mg/dL — ABNORMAL HIGH (ref 0.76–1.27)
GFR calc Af Amer: 43 mL/min/{1.73_m2} — ABNORMAL LOW (ref >59)
GFR calc non Af Amer: 37 mL/min/{1.73_m2} — ABNORMAL LOW (ref >59)
Glucose: 99 mg/dL (ref 65–99)
POTASSIUM: 4.1 mmol/L (ref 3.5–5.2)
Sodium: 140 mmol/L (ref 134–144)

## 2017-08-04 LAB — CBC
Hematocrit: 34 % — ABNORMAL LOW (ref 37.5–51.0)
Hemoglobin: 11.1 g/dL — ABNORMAL LOW (ref 13.0–17.7)
MCH: 28.5 pg (ref 26.6–33.0)
MCHC: 32.6 g/dL (ref 31.5–35.7)
MCV: 87 fL (ref 79–97)
Platelets: 155 10*3/uL (ref 150–379)
RBC: 3.9 x10E6/uL — ABNORMAL LOW (ref 4.14–5.80)
RDW: 15.6 % — AB (ref 12.3–15.4)
WBC: 4 10*3/uL (ref 3.4–10.8)

## 2017-08-05 ENCOUNTER — Encounter: Payer: Self-pay | Admitting: Internal Medicine

## 2017-08-17 ENCOUNTER — Ambulatory Visit: Payer: Medicare Other | Admitting: Internal Medicine

## 2017-08-17 DIAGNOSIS — R0989 Other specified symptoms and signs involving the circulatory and respiratory systems: Secondary | ICD-10-CM

## 2017-08-17 NOTE — Progress Notes (Deleted)
Follow-up Outpatient Visit Date: 08/17/2017  Primary Care Provider: Shirline Frees, MD [address] [place] [address]  Chief Complaint: ***  HPI:  [name] is a 83 y.o. year-old male with history of coronary artery disease s/p CABG (8937), chronic systolic and diastolic heart failure, aortic regurgitation, hypertension, hyperlipidemia, carotid artery stenosis, and chronic kidney disease stage 3, who presents for follow-up of heart failure.  I last saw Mr. Fiore 2 weeks ago, which time his main concerns were of leg swelling and rectal/penile bleeding after receiving a new clopidogrel prescription.  We noted his weight to be significantly elevated (up 14 pounds over 1 month) with evidence of significant lower extremity edema.  We increased his furosemide to 40 mg daily and again discussed importance of limiting salt intake.  We also opted to discontinue clopidogrel in favor of low-dose aspirin.  --------------------------------------------------------------------------------------------------  Past Medical History:  Diagnosis Date  . CAD (coronary artery disease)    a. CABG (2003)  b. 9/17/6: NSTEMI s/p overlapping DES x2 to LAD and DESx1 to SVG-->OM   . Carotid artery disease ([place])    a. 09/2013: carotid dopplers with stable 1-39% ICA stenosis bilaterally  //  b. Carotid US 3/42: RICA 87-68%; LICA 1-15% >> FU 1 year // c. Carotid US 09/2016: Bilateral ICA 1-39 >> follow-up 1 year  . CHF (congestive heart failure) ([place])   . Dementia   . History of echocardiogram    Echo 4/18: mild conc LVH, EF 35-40, diff HK, anteroseptal and inf-septal HK, Gr 2 DD, mild AS (mean 13), mod AI, mildly dilated asc Ao (43 mm), mod MAC, mod MR, severe LAE, mild TR, mod PI, PASP 43  . Hyperlipidemia   . Hypertension   . Internal hemorrhoids   . Ischemic cardiomyopathy    a. 2D ECHO 01/28/15 w/ EF 25-30%, severe HK of the basal-midinferoseptal myocardium, basal-midinferior myocardium and  midanteroseptal myocardium. Akinesis of apical inferior and apicalanterior myocardium. G2DD. Mild AS, Asc aorta diameter 59m. Mild dilation of aortic root. Mild MR, mild PR and PA peak pressure 36 mmg HG.  . Peptic ulcer disease   . Prostate CA ([facility]    a. s/p seed implants   Past Surgical History:  Procedure Laterality Date  . CARDIAC CATHETERIZATION N/A 01/27/2015   Procedure: Left Heart Cath and Coronary Angiography;  Surgeon: MWellington Hampshire MD;  [facility];  Service: Cardiovascular;  Laterality: N/A;  . CARDIAC SURGERY  2003   SVG LAD, SVG to right coronary artery, SVG to marginal    Recent CV Pertinent Labs: Lab Results  Component Value Date   CHOL 177 07/27/2015   HDL 26 (L) 07/27/2015   LDLCALC 128 07/27/2015   TRIG 117 07/27/2015   CHOLHDL 6.8 (H) 07/27/2015   INR 1.25 05/30/2015   BNP 1,224.9 (H) 06/19/2017   K 4.1 08/03/2017   MG 1.7 01/27/2015   BUN 38 (H) 08/03/2017   CREATININE 1.71 (H) 08/03/2017   CREATININE 1.51 (H) 09/17/2015    Past medical and surgical history were reviewed and updated in EPIC.  No outpatient medications have been marked as taking for the 08/17/17 encounter (Appointment) with Eveleigh Crumpler, CHarrell Gave MD.    Allergies: Ramipril; Statins; and Latex  Social History   Socioeconomic History  . Marital status: Married    Spouse name: Not on file  . Number of children: Not on file  . Years of education: Not on file  . Highest education level: Not  on file  Occupational History  . Occupation: Retired  Scientific laboratory technician  . Financial resource strain: Not on file  . Food insecurity:    Worry: Not on file    Inability: Not on file  . Transportation needs:    Medical: Not on file    Non-medical: Not on file  Tobacco Use  . Smoking status: Former Research scientist (life sciences)  . Smokeless tobacco: Never Used  Substance and Sexual Activity  . Alcohol use: No  . Drug use: No  . Sexual activity: Not on file  Lifestyle  . Physical activity:    Days per  week: Not on file    Minutes per session: Not on file  . Stress: Not on file  Relationships  . Social connections:    Talks on phone: Not on file    Gets together: Not on file    Attends religious service: Not on file    Active member of club or organization: Not on file    Attends meetings of clubs or organizations: Not on file    Relationship status: Not on file  . Intimate partner violence:    Fear of current or ex partner: Not on file    Emotionally abused: Not on file    Physically abused: Not on file    Forced sexual activity: Not on file  Other Topics Concern  . Not on file  Social History Narrative  . Not on file    Family History  Problem Relation Age of Onset  . Stroke Father     Review of Systems: A 12-system review of systems was performed and was negative except as noted in the HPI.  --------------------------------------------------------------------------------------------------  Physical Exam: There were no vitals taken for this visit.  General:  *** HEENT: No conjunctival pallor or scleral icterus. Moist mucous membranes.  OP clear. Neck: Supple without lymphadenopathy, thyromegaly, JVD, or HJR. No carotid bruit. Lungs: Normal work of breathing. Clear to auscultation bilaterally without wheezes or crackles. Heart: Regular rate and rhythm without murmurs, rubs, or gallops. Non-displaced PMI. Abd: Bowel sounds present. Soft, NT/ND without hepatosplenomegaly Ext: No lower extremity edema. Radial, PT, and DP pulses are 2+ bilaterally. Skin: Warm and dry without rash.  EKG:  ***  Lab Results  Component Value Date   WBC 4.0 08/03/2017   HGB 11.1 (L) 08/03/2017   HCT 34.0 (L) 08/03/2017   MCV 87 08/03/2017   PLT 155 08/03/2017    Lab Results  Component Value Date   NA 140 08/03/2017   K 4.1 08/03/2017   CL 105 08/03/2017   CO2 24 08/03/2017   BUN 38 (H) 08/03/2017   CREATININE 1.71 (H) 08/03/2017   GLUCOSE 99 08/03/2017   ALT 13 07/27/2015     Lab Results  Component Value Date   CHOL 177 07/27/2015   HDL 26 (L) 07/27/2015   LDLCALC 128 07/27/2015   TRIG 117 07/27/2015   CHOLHDL 6.8 (H) 07/27/2015    --------------------------------------------------------------------------------------------------  ASSESSMENT AND PLAN: Nelva Bush, MD 08/17/2017 7:43 AM

## 2017-08-18 ENCOUNTER — Encounter: Payer: Self-pay | Admitting: Internal Medicine

## 2017-09-07 ENCOUNTER — Encounter: Payer: Self-pay | Admitting: Internal Medicine

## 2017-09-07 ENCOUNTER — Ambulatory Visit (INDEPENDENT_AMBULATORY_CARE_PROVIDER_SITE_OTHER): Payer: Medicare Other | Admitting: Internal Medicine

## 2017-09-07 VITALS — BP 110/56 | HR 60 | Ht 66.0 in | Wt 145.6 lb

## 2017-09-07 DIAGNOSIS — I25118 Atherosclerotic heart disease of native coronary artery with other forms of angina pectoris: Secondary | ICD-10-CM | POA: Diagnosis not present

## 2017-09-07 DIAGNOSIS — I6523 Occlusion and stenosis of bilateral carotid arteries: Secondary | ICD-10-CM

## 2017-09-07 DIAGNOSIS — N183 Chronic kidney disease, stage 3 unspecified: Secondary | ICD-10-CM

## 2017-09-07 DIAGNOSIS — I10 Essential (primary) hypertension: Secondary | ICD-10-CM | POA: Diagnosis not present

## 2017-09-07 DIAGNOSIS — I5042 Chronic combined systolic (congestive) and diastolic (congestive) heart failure: Secondary | ICD-10-CM | POA: Diagnosis not present

## 2017-09-07 DIAGNOSIS — E785 Hyperlipidemia, unspecified: Secondary | ICD-10-CM | POA: Diagnosis not present

## 2017-09-07 LAB — COMPREHENSIVE METABOLIC PANEL
A/G RATIO: 1.2 (ref 1.2–2.2)
ALBUMIN: 3.8 g/dL (ref 3.5–4.7)
ALK PHOS: 97 IU/L (ref 39–117)
ALT: 19 IU/L (ref 0–44)
AST: 18 IU/L (ref 0–40)
BILIRUBIN TOTAL: 0.4 mg/dL (ref 0.0–1.2)
BUN / CREAT RATIO: 19 (ref 10–24)
BUN: 33 mg/dL — AB (ref 8–27)
CHLORIDE: 100 mmol/L (ref 96–106)
CO2: 24 mmol/L (ref 20–29)
Calcium: 9.4 mg/dL (ref 8.6–10.2)
Creatinine, Ser: 1.73 mg/dL — ABNORMAL HIGH (ref 0.76–1.27)
GFR calc non Af Amer: 36 mL/min/{1.73_m2} — ABNORMAL LOW (ref >59)
GFR, EST AFRICAN AMERICAN: 42 mL/min/{1.73_m2} — AB (ref >59)
GLOBULIN, TOTAL: 3.2 g/dL (ref 1.5–4.5)
GLUCOSE: 155 mg/dL — AB (ref 65–99)
POTASSIUM: 4.1 mmol/L (ref 3.5–5.2)
SODIUM: 139 mmol/L (ref 134–144)
TOTAL PROTEIN: 7 g/dL (ref 6.0–8.5)

## 2017-09-07 LAB — LIPID PANEL
CHOL/HDL RATIO: 3.1 ratio (ref 0.0–5.0)
Cholesterol, Total: 180 mg/dL (ref 100–199)
HDL: 58 mg/dL (ref >39)
LDL Calculated: 108 mg/dL — ABNORMAL HIGH (ref 0–99)
Triglycerides: 72 mg/dL (ref 0–149)
VLDL Cholesterol Cal: 14 mg/dL (ref 5–40)

## 2017-09-07 LAB — LDL CHOLESTEROL, DIRECT: LDL DIRECT: 126 mg/dL — AB (ref 0–99)

## 2017-09-07 NOTE — Progress Notes (Signed)
Follow-up Outpatient Visit Date: 09/07/2017  Primary Care Provider: Shirline Frees, MD [address] [place] [address]  Chief Complaint: Follow-up heart failure  HPI:  [name] is a 83 y.o. year-old male with history of coronary artery disease s/p CABG (5465), chronic systolic and diastolic heart failure, aortic regurgitation, hypertension, hyperlipidemia, carotid artery stenosis, and chronic kidney disease stage 3, who presents for follow-up of heart failure.  I last saw [name] a month ago, at which time Corey main concerns were of leg swelling and rectal/penile bleeding after receiving a new clopidogrel prescription.  He was a no-show for Corey 2-week follow-up appointment earlier this month.  At Corey last visit, he noted Corey weight to be significantly elevated (up 14 pounds over 1 month) with evidence of significant lower extremity edema.  We increased Corey furosemide to 40 mg daily and again discussed importance of limiting salt intake.  We also opted to discontinue clopidogrel in favor of low-dose aspirin.  Today, [name] reports feeling well.  He has chronic exertional dyspnea when walking 50 to 100 feet.  He is able to carry out Corey activities of daily living without difficulty.  This has been a long-standing issue for him and is not significantly worse than at prior visits.  Since our last visit, [name] has only intermittently been taking furosemide.  Corey wife was concerned because of a generic form of the furosemide that they received from their first pharmacy and potential for bleeding.  She was able to procure generic furosemide from a different supplier and is confident that her Rivers will not have issues with this one.  He has been back on furosemide 40 mg daily for about 2 weeks.  He has noted some improvement in Corey swelling.  He does not weigh himself regularly.  He denies orthopnea and PND as well as chest pain, palpitations, and lightheadedness.  He reports only one  episode of scant rectal bleeding since our last visit.  He has not needed to take sublingual nitroglycerin.  He is now taking isosorbide mononitrate 30 mg daily, as discussed at our last visit.  --------------------------------------------------------------------------------------------------  Past Medical History:  Diagnosis Date  . CAD (coronary artery disease)    a. CABG (2003)  b. 9/17/6: NSTEMI s/p overlapping DES x2 to LAD and DESx1 to SVG-->OM   . Carotid artery disease ([place])    a. 09/2013: carotid dopplers with stable 1-39% ICA stenosis bilaterally  //  b. Carotid US 0/35: RICA 46-56%; LICA 8-12% >> FU 1 year // c. Carotid US 09/2016: Bilateral ICA 1-39 >> follow-up 1 year  . CHF (congestive heart failure) ([place])   . Dementia   . History of echocardiogram    Echo 4/18: mild conc LVH, EF 35-40, diff HK, anteroseptal and inf-septal HK, Gr 2 DD, mild AS (mean 13), mod AI, mildly dilated asc Ao (43 mm), mod MAC, mod MR, severe LAE, mild TR, mod PI, PASP 43  . Hyperlipidemia   . Hypertension   . Internal hemorrhoids   . Ischemic cardiomyopathy    a. 2D ECHO 01/28/15 w/ EF 25-30%, severe HK of the basal-midinferoseptal myocardium, basal-midinferior myocardium and midanteroseptal myocardium. Akinesis of apical inferior and apicalanterior myocardium. G2DD. Mild AS, Asc aorta diameter 44m. Mild dilation of aortic root. Mild MR, mild PR and PA peak pressure 36 mmg HG.  . Peptic ulcer disease   . Prostate CA ([facility]    a. s/p seed implants   Past Surgical History:  Procedure Laterality Date  . CARDIAC CATHETERIZATION N/A 01/27/2015   Procedure: Left Heart Cath and Coronary Angiography;  Surgeon: Wellington Hampshire, MD;  Location: [facility];  Service: Cardiovascular;  Laterality: N/A;  . CARDIAC SURGERY  2003   SVG LAD, SVG to right coronary artery, SVG to marginal    Current Meds  Medication Sig  . aspirin EC 81 MG tablet Take 1 tablet (81 mg total) by mouth daily.  . carvedilol  (COREG) 6.25 MG tablet take 1 tablet by mouth twice a day with meals  . furosemide (LASIX) 40 MG tablet Take 1 tablet (40 mg total) by mouth daily.  . isosorbide mononitrate (IMDUR) 30 MG 24 hr tablet Take 1 tablet (30 mg total) by mouth daily.  Marland Kitchen loratadine (CLARITIN) 10 MG tablet Take 10 mg by mouth daily.  . nitroGLYCERIN (NITROSTAT) 0.4 MG SL tablet place 1 tablet under the tongue if needed every 5 minutes for chest pain  UP TO 3 DOSES  . PROAIR HFA 108 (90 Base) MCG/ACT inhaler Inhale 2 puffs into the lungs every six hours as needed for shortness of breath or wheezing    Allergies: Ramipril; Statins; and Latex  Social History   Tobacco Use  . Smoking status: Former Research scientist (life sciences)  . Smokeless tobacco: Never Used  Substance Use Topics  . Alcohol use: No  . Drug use: No    Family History  Problem Relation Age of Onset  . Stroke Father     Review of Systems: A 12-system review of systems was performed and was negative except as noted in the HPI.  --------------------------------------------------------------------------------------------------  Physical Exam: BP (!) 110/56   Pulse 60   Ht _0  (1.676 m)   Wt 145 lb 9.6 oz (66 kg)   BMI 23.50 kg/m   General:  Frail, elderly man, seated comfortably in the exam room.  He is accompanied by Corey wife. HEENT: No conjunctival pallor or scleral icterus. Moist mucous membranes.  OP clear. Neck: Supple without lymphadenopathy, thyromegaly, JVD, or HJR. No carotid bruit. Lungs: Normal work of breathing. Clear to auscultation bilaterally without wheezes or crackles. Heart: Regular rate and rhythm with 2/6 holosystolic murmur loudest at the LLSB.  No rubs or gallops. Non-displaced PMI. Abd: Bowel sounds present. Soft, NT/ND without hepatosplenomegaly Ext: 2+ pretibial edema to the mid calves bilaterally. Skin: Warm and dry without rash.  Lab Results  Component Value Date   WBC 4.0 08/03/2017   HGB 11.1 (L) 08/03/2017   HCT 34.0 (L)  08/03/2017   MCV 87 08/03/2017   PLT 155 08/03/2017    Lab Results  Component Value Date   NA 140 08/03/2017   K 4.1 08/03/2017   CL 105 08/03/2017   CO2 24 08/03/2017   BUN 38 (H) 08/03/2017   CREATININE 1.71 (H) 08/03/2017   GLUCOSE 99 08/03/2017   ALT 13 07/27/2015    Lab Results  Component Value Date   CHOL 177 07/27/2015   HDL 26 (L) 07/27/2015   LDLCALC 128 07/27/2015   TRIG 117 07/27/2015   CHOLHDL 6.8 (H) 07/27/2015    --------------------------------------------------------------------------------------------------  ASSESSMENT AND PLAN: Chronic systolic and diastolic heart failure Mr. Rivers still appears volume overloaded on exam, though he has not been taking furosemide regularly, as we discussed at our last visit.  I stressed the importance of medication compliance as well as sodium restriction.  I have asked him to continue with furosemide 40 mg daily.  I will check Corey renal function  and electrolytes today.  We will continue with carvedilol 6.25 mg twice daily.  I will defer adding ACE inhibitor/ARB at this time in the setting of chronic kidney disease and history of angioedema with ramipril.  We could consider adding hydralazine to isosorbide mononitrate to optimize evidence-based heart failure therapy in the future.  However, I am hesitant to do this right now due to borderline low blood pressure.  Coronary artery disease with stable angina No chest pain since our last visit.  [name] seems to be tolerating isosorbide mononitrate 30 mg daily and carvedilol 6.25 mg daily well.  We will not make any medication changes at this time.  Chronic kidney disease stage III We will recheck a basic metabolic panel today and continue with furosemide 40 mg p.o. Daily.  Essential hypertension Blood pressure low normal today.  No medication changes at this time.  Carotid artery stenosis Mild bilateral carotid artery stenosis noted last year.  We will plan to repeat study next  month to ensure that it has not progressed.  Hyperlipidemia Goal LDL less than 70.  [name] has been intolerant of statins in the past and noncompliant with ezetimibe.  I will recheck a lipid panel today.  Based on results, we will readdress a retrial of ezetimibe versus referral to the lipid clinic to consider PCSK9 inhibitor therapy.  Follow-up: Return to clinic in 3 months.   Nelva Bush, MD 09/07/2017 10:03 AM

## 2017-09-07 NOTE — Patient Instructions (Addendum)
Medication Instructions:  Your physician recommends that you continue on your current medications as directed. Please refer to the Current Medication list given to you today.  -- If you need a refill on your cardiac medications before your next appointment, please call your pharmacy. --  Labwork: CM[facility] LIPID PROFILE WITH DIRECT LDL   Testing/Procedures: Please schedule   Your physician has requested that you have a carotid duplex. This test is an ultrasound of the carotid arteries in your neck. It looks at blood flow through these arteries that supply the brain with blood. Allow one hour for this exam. There are no restrictions or special instructions.   Follow-Up: Your physician wants you to follow-up in: 3 months with Kathleen Argue    Thank you for choosing [facility]!!    Any Other Special Instructions Will Be Listed Below (If Applicable).

## 2017-09-08 ENCOUNTER — Encounter: Payer: Self-pay | Admitting: Internal Medicine

## 2017-09-08 ENCOUNTER — Other Ambulatory Visit: Payer: Self-pay

## 2017-09-08 DIAGNOSIS — E785 Hyperlipidemia, unspecified: Secondary | ICD-10-CM

## 2017-09-08 DIAGNOSIS — I6523 Occlusion and stenosis of bilateral carotid arteries: Secondary | ICD-10-CM | POA: Insufficient documentation

## 2017-09-08 MED ORDER — EZETIMIBE 10 MG PO TABS: 10.0000 mg | ORAL_TABLET | Freq: Every day | ORAL | 3 refills | Status: DC

## 2017-09-08 NOTE — Progress Notes (Unsigned)
eze

## 2017-09-10 ENCOUNTER — Ambulatory Visit (HOSPITAL_COMMUNITY)
Admission: RE | Admit: 2017-09-10 | Discharge: 2017-09-10 | Disposition: A | Discharge status: Home / Self Care | Payer: Medicare Other | Source: Ambulatory Visit | Attending: Cardiovascular Disease | Admitting: Cardiovascular Disease

## 2017-09-10 DIAGNOSIS — E785 Hyperlipidemia, unspecified: Secondary | ICD-10-CM | POA: Diagnosis not present

## 2017-09-10 DIAGNOSIS — I252 Old myocardial infarction: Secondary | ICD-10-CM | POA: Insufficient documentation

## 2017-09-10 DIAGNOSIS — I6523 Occlusion and stenosis of bilateral carotid arteries: Secondary | ICD-10-CM | POA: Insufficient documentation

## 2017-09-10 DIAGNOSIS — I779 Disorder of arteries and arterioles, unspecified: Secondary | ICD-10-CM | POA: Diagnosis present

## 2017-09-10 DIAGNOSIS — I739 Peripheral vascular disease, unspecified: Secondary | ICD-10-CM

## 2017-09-10 DIAGNOSIS — I10 Essential (primary) hypertension: Secondary | ICD-10-CM | POA: Insufficient documentation

## 2017-09-10 DIAGNOSIS — I2510 Atherosclerotic heart disease of native coronary artery without angina pectoris: Secondary | ICD-10-CM | POA: Diagnosis not present

## 2017-09-10 DIAGNOSIS — Z87891 Personal history of nicotine dependence: Secondary | ICD-10-CM | POA: Insufficient documentation

## 2017-09-11 ENCOUNTER — Encounter: Payer: Self-pay | Admitting: Physician Assistant

## 2017-09-11 ENCOUNTER — Telehealth: Payer: Self-pay | Admitting: *Deleted

## 2017-09-11 DIAGNOSIS — I6523 Occlusion and stenosis of bilateral carotid arteries: Secondary | ICD-10-CM

## 2017-09-11 NOTE — Telephone Encounter (Signed)
Pt's wife has been notified of carotid results for pt (DPR for pt's wife). Pt's wife agreeable to repeat carotids in 1 year. I will forward a copy of results to Dr. Shirline Frees, PCP for the pt. Pt's wife thanked me for the call.

## 2017-09-11 NOTE — Telephone Encounter (Signed)
-----   Message from Liliane Shi, [place] sent at 09/11/2017  1:49 PM EDT ----- Please call the patient. Carotid ultrasound demonstrates stable bilateral plaque in the internal carotid arteries (1-39%). Continue current medications and follow up as planned.  Repeat 12 months Please fax a copy to PCP:  Shirline Frees, MD  [name], PA-C    09/11/2017 1:48 PM

## 2017-11-27 ENCOUNTER — Telehealth: Payer: Self-pay

## 2017-11-27 NOTE — Telephone Encounter (Signed)
Left patient message reminding him of his Lipid lab draw scheduled 7/29.. ( 3 month reminder ).

## 2017-12-07 ENCOUNTER — Other Ambulatory Visit: Payer: Medicare Other

## 2017-12-07 NOTE — Progress Notes (Signed)
Cardiology Office Note:    Date:  12/08/2017   ID:  [name], DOB [birth year], MRN [id number]  PCP:  Shirline Frees, MD  Cardiologist:  Nelva Bush, MD    Referring MD: Shirline Frees, MD   Chief Complaint  Patient presents with  . Follow-up    CHF, CAD    History of Present Illness:    [name] is a 83 y.o. male with coronary artery disease s/p CABG in 2003, aortic insufficiency, combined systolic and diastolic heart failure, HTN, HL, carotid artery disease.He has been nonadherent to follow-up and medications in the past. He has a history of elevated LFTs and total CK on statin therapy in the past.  He had a NSTEMI in 9/16 and Cardiac Catheterization demonstrated severe 3 vessel CAD with a chronically occluded LCx, RCA and severe subtotal occlusion of the native mid LAD, chronically occluded SVG-RCA and SVG-LAD with severe stenosis and SVG-OM. He underwent PCI with DES 2 in overlapping fashion to the native LAD as well as DES to the SVG-OM.  He was admitted in February 2019 for decompensated heart failure.  Last seen in clinic by Dr. Saunders Revel in April 2019.  A retrial of ezetimibe was started due to suboptimally controlled lipids.   [name] returns for follow-up on coronary artery disease and congestive heart failure.  He is here today with his wife.  Since last seen, he has been doing well.  He denies chest discomfort or significant shortness of breath.  He does have some ankle swelling without significant change.  He denies syncope, orthopnea, PND.  He denies any bleeding issues.  He seems to be tolerating ezetimibe.  Prior CV studies:   The following studies were reviewed today:  Carotid US 09/10/2017 Bilateral ICA 1-39; repeat 12 months  Echo 08/20/16 Mild conc LVH, EF 35-40, ant-sept and inf-sept HK, Gr 2 DD, mild AS (mean 13), mod AI, mild dilated asc aorta (43 mm), MAC, mod MR, severe LAE, mild TR, mod PI, PASP 43  Carotid US 9/92:  RICA 42-68%; LICA 3-41% >> FU 1  year  Echo 05/31/15 Mild LVH, EF 40-45%, inf-septal HK, apical inf-septal AK, Gr 1 DD, mild AI, Aortic Sclerosis without stenosis (mean 15 mmHg), mod MAC, mild to mod reduced RVSF  Echo 01/28/15 Inferoseptal HK, inferior HK, apical AK, anteroseptal HK, moderate LVH, EF 96-22% grade 2 diastolic dysfunction, mild aortic stenosis (mean 9 mmHg), moderate AI, mildly dilated aortic root, ascending aortic diameter 40 mm (mildly dilated), mild MR, mild LAE, mild PI, PASP 36 mmHg  LHC 01/27/15 LM: 40% LAD: Mid 99%, 99% >> PCI: Synergy DES x 2 (overlapping) LCx: 100% RCA: 100% SVG-LAD: 100% SVG-OM2 90% >> PCI: Synergy DES SVG-RCA 100% EF 25-35%, 4+ MR  Abd Aorta US 09/19/13 No AAA  Carotid US 09/19/13 Bilateral ICA 1-39% FU 2 years (due in 09/2015)  Past Medical History:  Diagnosis Date  . CAD (coronary artery disease)    a. CABG (2003)  b. 9/17/6: NSTEMI s/p overlapping DES x2 to LAD and DESx1 to SVG-->OM   . Carotid artery disease ([place])    a. 09/2013: carotid dopplers with stable 1-39% ICA stenosis bilaterally  //  b. Carotid US 2/97: RICA 98-92%; LICA 1-19% >> FU 1 year // c. Carotid US 09/2016: Bilateral ICA 1-39 >> follow-up 1 year  //  d. Carotid US 5/19: Bilateral ICA 1-39; decreased flow in right subclavian artery   . CHF (congestive heart failure) ([place])   . Dementia   .  History of echocardiogram    Echo 4/18: mild conc LVH, EF 35-40, diff HK, anteroseptal and inf-septal HK, Gr 2 DD, mild AS (mean 13), mod AI, mildly dilated asc Ao (43 mm), mod MAC, mod MR, severe LAE, mild TR, mod PI, PASP 43  . Hyperlipidemia   . Hypertension   . Internal hemorrhoids   . Ischemic cardiomyopathy    a. 2D ECHO 01/28/15 w/ EF 25-30%, severe HK of the basal-midinferoseptal myocardium, basal-midinferior myocardium and midanteroseptal myocardium. Akinesis of apical inferior and apicalanterior myocardium. G2DD. Mild AS, Asc aorta diameter 57m. Mild dilation of aortic root. Mild MR, mild PR and PA peak  pressure 36 mmg HG.  . Peptic ulcer disease   . Prostate CA ([facility]    a. s/p seed implants   Surgical Hx: The patient  has a past surgical history that includes Cardiac surgery (2003) and Cardiac catheterization (N/A, 01/27/2015).   Current Medications: Current Meds  Medication Sig  . aspirin EC 81 MG tablet Take 1 tablet (81 mg total) by mouth daily.  . carvedilol (COREG) 6.25 MG tablet take 1 tablet by mouth twice a day with meals  . loratadine (CLARITIN) 10 MG tablet Take 10 mg by mouth daily.  . nitroGLYCERIN (NITROSTAT) 0.4 MG SL tablet place 1 tablet under the tongue if needed every 5 minutes for chest pain  UP TO 3 DOSES  . PROAIR HFA 108 (90 Base) MCG/ACT inhaler Inhale 2 puffs into the lungs every six hours as needed for shortness of breath or wheezing     Allergies:   Ramipril; Statins; and Latex   Social History   Tobacco Use  . Smoking status: Former SResearch scientist (life sciences) . Smokeless tobacco: Never Used  Substance Use Topics  . Alcohol use: No  . Drug use: No     Family Hx: The patient's family history includes Stroke in his father.  ROS:   Please see the history of present illness.    ROS All other systems reviewed and are negative.   EKGs/Labs/Other Test Reviewed:    EKG:  EKG is not ordered today.    Recent Labs: 06/19/2017: B Natriuretic Peptide 1,224.9 08/03/2017: Hemoglobin 11.1; Platelets 155 09/07/2017: ALT 19; BUN 33; Creatinine, Ser 1.73; Potassium 4.1; Sodium 139   From KPN Tool: Cholesterol, total  174.000  09/28/2017 HDL    55.000  09/28/2017 LDL    108.000  09/28/2017 Triglycerides   57.000  09/28/2017 A1C    6.100   09/28/2017 Hemoglobin   11.300  09/28/2017 Creatinine, Serum  1.820   09/28/2017 Potassium   4.200   09/28/2017 ALT (SGPT)   13.000  09/28/2017 TSH    2.700   01/31/2016  Recent Lipid Panel Lab Results  Component Value Date/Time   CHOL 180 09/07/2017 10:18 AM   TRIG 72 09/07/2017 10:18 AM   HDL 58 09/07/2017 10:18 AM   CHOLHDL 3.1 09/07/2017  10:18 AM   CHOLHDL 6.8 (H) 07/27/2015 08:45 AM   LDLCALC 108 (H) 09/07/2017 10:18 AM   LDLDIRECT 126 (H) 09/07/2017 10:18 AM    Physical Exam:    VS:  BP 104/60   Pulse 62   Ht 5' 6"  (1.676 m)   Wt 141 lb 12.8 oz (64.3 kg)   SpO2 97%   BMI 22.89 kg/m     Wt Readings from Last 3 Encounters:  12/08/17 141 lb 12.8 oz (64.3 kg)  09/07/17 145 lb 9.6 oz (66 kg)  08/03/17 148 lb 12.8 oz (67.5 kg)  Physical Exam  Constitutional: He is oriented to person, place, and time. He appears well-developed and well-nourished. No distress.  HENT:  Head: Normocephalic and atraumatic.  Eyes: No scleral icterus.  Neck: Neck supple. No JVD present.  Cardiovascular: Normal rate, regular rhythm, S1 normal and S2 normal.  Murmur heard.  Low-pitched systolic murmur is present with a grade of 2/6 at the upper right sternal border.  Diastolic murmur is present with a grade of 2/6 at the upper right sternal border. Pulmonary/Chest: Breath sounds normal. He has no rales.  Abdominal: Soft. There is no hepatomegaly.  Musculoskeletal: He exhibits edema (trace bilat ankle edema).  Neurological: He is alert and oriented to person, place, and time.  Skin: Skin is warm and dry.  Psychiatric: He has a normal mood and affect.    ASSESSMENT & PLAN:    Coronary artery disease of native artery of native heart with stable angina pectoris ([place]) History of CABG.  He suffered a non-ST elevation myocardial infarction in September 2016 treated with drug-eluting stent x2 to the native LAD and drug-eluting stent to the vein graft to the obtuse marginal.  He has a chronically occluded RCA and an occluded SVG-RCA.  He is currently doing well without anginal symptoms on beta-blocker and long-acting nitrate therapy.  Continue aspirin, carvedilol, ezetimibe, isosorbide.  Chronic combined systolic and diastolic heart failure (HCC) EF 35-40.  NYHA 2.  Volume status appears stable.  Continue current medical regimen.  Arrange  follow-up BMET.  Hypertensive heart disease with chronic combined systolic and diastolic congestive heart failure ([place]) The patient's blood pressure is controlled on his current regimen.  Continue current therapy.   Hyperlipidemia, unspecified hyperlipidemia type  He is currently tolerating ezetimibe.  Arrange follow-up fasting lipids and LFTs.  Bilateral carotid artery disease, unspecified type ([place]) Recent carotid US with mild bilateral disease.  Repeat will be obtained in 1 year.  Chronic kidney disease, stage 3 (HCC)  Most recent creatinine stable.  Arrange follow-up BMET in the next few weeks.   Dispo:  Return in about 4 months (around 04/10/2018) for Routine Follow Up, w/ [name], PA-C.   Medication Adjustments/Labs and Tests Ordered: Current medicines are reviewed at length with the patient today.  Concerns regarding medicines are outlined above.  Tests Ordered: Orders Placed This Encounter  Procedures  . Basic Metabolic Panel (BMET)  . Hepatic function panel  . Lipid Profile   Medication Changes: No orders of the defined types were placed in this encounter.   Signed, [name], PA-C  12/08/2017 8:49 AM    [facility] [place], [place], [place]  [address] Phone: [phone]; Fax: [phone]

## 2017-12-08 ENCOUNTER — Other Ambulatory Visit: Payer: Self-pay | Admitting: *Deleted

## 2017-12-08 ENCOUNTER — Encounter: Payer: Self-pay | Admitting: Physician Assistant

## 2017-12-08 ENCOUNTER — Encounter (INDEPENDENT_AMBULATORY_CARE_PROVIDER_SITE_OTHER): Payer: Self-pay

## 2017-12-08 ENCOUNTER — Ambulatory Visit: Payer: Medicare Other | Admitting: Physician Assistant

## 2017-12-08 VITALS — BP 104/60 | HR 62 | Ht 66.0 in | Wt 141.8 lb

## 2017-12-08 DIAGNOSIS — N183 Chronic kidney disease, stage 3 unspecified: Secondary | ICD-10-CM

## 2017-12-08 DIAGNOSIS — E785 Hyperlipidemia, unspecified: Secondary | ICD-10-CM | POA: Diagnosis not present

## 2017-12-08 DIAGNOSIS — I25118 Atherosclerotic heart disease of native coronary artery with other forms of angina pectoris: Secondary | ICD-10-CM

## 2017-12-08 DIAGNOSIS — I110 Hypertensive heart disease with heart failure: Secondary | ICD-10-CM

## 2017-12-08 DIAGNOSIS — I779 Disorder of arteries and arterioles, unspecified: Secondary | ICD-10-CM

## 2017-12-08 DIAGNOSIS — I255 Ischemic cardiomyopathy: Secondary | ICD-10-CM

## 2017-12-08 DIAGNOSIS — I5042 Chronic combined systolic (congestive) and diastolic (congestive) heart failure: Secondary | ICD-10-CM | POA: Diagnosis not present

## 2017-12-08 DIAGNOSIS — I739 Peripheral vascular disease, unspecified: Secondary | ICD-10-CM

## 2017-12-08 MED ORDER — CARVEDILOL 6.25 MG PO TABS: 6.2500 mg | ORAL_TABLET | Freq: Two times a day (BID) | ORAL | 3 refills | Status: DC

## 2017-12-08 NOTE — Patient Instructions (Addendum)
Medication Instructions:  Your physician recommends that you continue on your current medications as directed. Please refer to the Current Medication list given to you today.   Labwork: IN 2-3 WEEKS FASTING LIPID AND LIVER PANEL   Testing/Procedures: NONE ORDERED TODAY  Follow-Up: 04/13/18 @ 8:15 WITH [facility]  Any Other Special Instructions Will Be Listed Below (If Applicable).     If you need a refill on your cardiac medications before your next appointment, please call your pharmacy.

## 2017-12-21 ENCOUNTER — Other Ambulatory Visit: Payer: Medicare Other | Admitting: *Deleted

## 2017-12-21 DIAGNOSIS — N183 Chronic kidney disease, stage 3 unspecified: Secondary | ICD-10-CM

## 2017-12-21 DIAGNOSIS — E785 Hyperlipidemia, unspecified: Secondary | ICD-10-CM

## 2017-12-21 DIAGNOSIS — I5042 Chronic combined systolic (congestive) and diastolic (congestive) heart failure: Secondary | ICD-10-CM

## 2017-12-21 LAB — LIPID PANEL
CHOL/HDL RATIO: 2.9 ratio (ref 0.0–5.0)
Cholesterol, Total: 177 mg/dL (ref 100–199)
HDL: 62 mg/dL (ref >39)
LDL Calculated: 102 mg/dL — ABNORMAL HIGH (ref 0–99)
Triglycerides: 63 mg/dL (ref 0–149)
VLDL Cholesterol Cal: 13 mg/dL (ref 5–40)

## 2017-12-21 LAB — BASIC METABOLIC PANEL
BUN/Creatinine Ratio: 18 (ref 10–24)
BUN: 32 mg/dL — ABNORMAL HIGH (ref 8–27)
CALCIUM: 9.3 mg/dL (ref 8.6–10.2)
CO2: 22 mmol/L (ref 20–29)
CREATININE: 1.74 mg/dL — AB (ref 0.76–1.27)
Chloride: 100 mmol/L (ref 96–106)
GFR calc Af Amer: 42 mL/min/{1.73_m2} — ABNORMAL LOW (ref >59)
GFR, EST NON AFRICAN AMERICAN: 36 mL/min/{1.73_m2} — AB (ref >59)
Glucose: 96 mg/dL (ref 65–99)
Potassium: 4.3 mmol/L (ref 3.5–5.2)
Sodium: 138 mmol/L (ref 134–144)

## 2017-12-21 LAB — HEPATIC FUNCTION PANEL
ALBUMIN: 3.7 g/dL (ref 3.5–4.7)
ALK PHOS: 78 IU/L (ref 39–117)
ALT: 14 IU/L (ref 0–44)
AST: 15 IU/L (ref 0–40)
BILIRUBIN TOTAL: 0.8 mg/dL (ref 0.0–1.2)
BILIRUBIN, DIRECT: 0.21 mg/dL (ref 0.00–0.40)
Total Protein: 6.5 g/dL (ref 6.0–8.5)

## 2017-12-22 ENCOUNTER — Telehealth: Payer: Self-pay | Admitting: *Deleted

## 2017-12-22 DIAGNOSIS — I25118 Atherosclerotic heart disease of native coronary artery with other forms of angina pectoris: Secondary | ICD-10-CM

## 2017-12-22 DIAGNOSIS — E785 Hyperlipidemia, unspecified: Secondary | ICD-10-CM

## 2017-12-22 NOTE — Telephone Encounter (Signed)
DPR ok to s/w pt's wife who has been notified of pt's lab results and recommendations. Pt's wife is agreeable to plan of care and referral to Pharm-D to discuss PCSK-9. I will send message to [facility] to make appt with Pharm-D. Pt's wife thanked me for the call.

## 2017-12-22 NOTE — Telephone Encounter (Signed)
-----   Message from Liliane Shi, [place] sent at 12/21/2017  8:47 PM EDT ----- The following labs are stable without significant clinical change:  Creatinine. The potassium, LFTs are normal. The LDL is too high (goal <70). All other results are normal or within acceptable limits. Medication changes / Follow up labs / Other changes or recommendations:    - Continue Ezetimibe.  - Refer to [facility] for consideration of PCSK9i therapy. [name], PA-C 12/21/2017 8:46 PM

## 2018-01-15 ENCOUNTER — Ambulatory Visit: Payer: Medicare Other

## 2018-04-12 NOTE — Progress Notes (Deleted)
Cardiology Office Note:    Date:  04/12/2018   ID:  [name], DOB 03/21/1935, MRN [id number]  PCP:  Corey Frees, MD  Cardiologist:  Corey Bush, MD *** Electrophysiologist:  None   Referring MD: Corey Frees, MD   No chief complaint on file. ***  History of Present Illness:    [name] is a 83 y.o. male with coronary artery disease s/p CABG in 2003, aortic insufficiency, combined systolic and diastolic heart failure, HTN, HL, carotid artery disease.He has been nonadherent to follow-up and medications in the past. He has a history of elevated LFTs and total CK on statin therapy in the past.He had a NSTEMI in 9/16 andCardiac Catheterizationdemonstrated severe 3 vessel CAD with a chronically occluded LCx, RCA and severe subtotal occlusion of the native mid LAD, chronically occluded SVG-RCA and SVG-LAD with severe stenosis and SVG-OM. He underwent PCI with DES 2 in overlapping fashion to the native LAD as well as DES to the SVG-OM.  He was admitted in February 2019 for decompensated heart failure.  He was last seen in 11/2017.  FU Lipids demonstrated an elevated LDL on Ezetimibe.  I referred him to the [facility] but he cancelled the appointment.  ***  [name] ***  Prior CV studies:   The following studies were reviewed today:  *** Carotid US 09/10/2017 Bilateral ICA 1-39; repeat 12 months  Echo 08/20/16 Mild conc LVH, EF 35-40, ant-sept and inf-sept HK, Gr 2 DD, mild AS (mean 13), mod AI, mild dilated asc aorta (43 mm), MAC, mod MR, severe LAE, mild TR, mod PI, PASP 43  Carotid US 8/78:  RICA 67-67%; LICA 2-09% >> FU 1 year  Echo 05/31/15 Mild LVH, EF 40-45%, inf-septal HK, apical inf-septal AK, Gr 1 DD, mild AI, Aortic Sclerosis without stenosis (mean 15 mmHg), mod MAC, mild to mod reduced RVSF  Echo 01/28/15 Inferoseptal HK, inferior HK, apical AK, anteroseptal HK, moderate LVH, EF 47-09% grade 2 diastolic dysfunction, mild aortic stenosis (mean 9 mmHg),  moderate AI, mildly dilated aortic root, ascending aortic diameter 40 mm (mildly dilated), mild MR, mild LAE, mild PI, PASP 36 mmHg  LHC 01/27/15 LM: 40% LAD: Mid 99%, 99% >>PCI:Synergy DES x 2 (overlapping) LCx: 100% RCA: 100% SVG-LAD: 100% SVG-OM2 90% >>PCI:Synergy DES SVG-RCA 100% EF 25-35%, 4+ MR  Abd Aorta US 09/19/13 No AAA  Carotid US 09/19/13 Bilateral ICA 1-39% FU 2 years (due in 09/2015)  Past Medical History:  Diagnosis Date  . CAD (coronary artery disease)    a. CABG (2003)  b. 9/17/6: NSTEMI s/p overlapping DES x2 to LAD and DESx1 to SVG-->OM   . Carotid artery disease ([place])    a. 09/2013: carotid dopplers with stable 1-39% ICA stenosis bilaterally  //  b. Carotid US 6/28: RICA 36-62%; LICA 9-47% >> FU 1 year // c. Carotid US 09/2016: Bilateral ICA 1-39 >> follow-up 1 year  //  d. Carotid US 5/19: Bilateral ICA 1-39; decreased flow in right subclavian artery   . CHF (congestive heart failure) ([place])   . Dementia   . History of echocardiogram    Echo 4/18: mild conc LVH, EF 35-40, diff HK, anteroseptal and inf-septal HK, Gr 2 DD, mild AS (mean 13), mod AI, mildly dilated asc Ao (43 mm), mod MAC, mod MR, severe LAE, mild TR, mod PI, PASP 43  . Hyperlipidemia   . Hypertension   . Internal hemorrhoids   . Ischemic cardiomyopathy    a. 2D ECHO 01/28/15 w/ EF  25-30%, severe HK of the basal-midinferoseptal myocardium, basal-midinferior myocardium and midanteroseptal myocardium. Akinesis of apical inferior and apicalanterior myocardium. G2DD. Mild AS, Asc aorta diameter 58m. Mild dilation of aortic root. Mild MR, mild PR and PA peak pressure 36 mmg HG.  . Peptic ulcer disease   . Prostate CA ([facility]    a. s/p seed implants   Surgical Hx: The patient  has a past surgical history that includes Cardiac surgery (2003) and Cardiac catheterization (N/A, 01/27/2015).   Current Medications: No outpatient medications have been marked as taking for the 04/13/18 encounter  (Appointment) with Corey DoppT, PA-C.     Allergies:   Ramipril; Statins; and Latex   Social History   Tobacco Use  . Smoking status: Former SResearch scientist (life sciences) . Smokeless tobacco: Never Used  Substance Use Topics  . Alcohol use: No  . Drug use: No     Family Hx: The patient's family history includes Stroke in his father.  ROS:   Please see the history of present illness.    ROS All other systems reviewed and are negative.   EKGs/Labs/Other Test Reviewed:    EKG:  EKG is *** ordered today.  The ekg ordered today demonstrates ***  Recent Labs: 06/19/2017: B Natriuretic Peptide 1,224.9 08/03/2017: Hemoglobin 11.1; Platelets 155 12/21/2017: ALT 14; BUN 32; Creatinine, Ser 1.74; Potassium 4.3; Sodium 138   Recent Lipid Panel Lab Results  Component Value Date/Time   CHOL 177 12/21/2017 08:07 AM   TRIG 63 12/21/2017 08:07 AM   HDL 62 12/21/2017 08:07 AM   CHOLHDL 2.9 12/21/2017 08:07 AM   CHOLHDL 6.8 (H) 07/27/2015 08:45 AM   LDLCALC 102 (H) 12/21/2017 08:07 AM   LDLDIRECT 126 (H) 09/07/2017 10:18 AM    Physical Exam:    VS:  There were no vitals taken for this visit.    Wt Readings from Last 3 Encounters:  12/08/17 141 lb 12.8 oz (64.3 kg)  09/07/17 145 lb 9.6 oz (66 kg)  08/03/17 148 lb 12.8 oz (67.5 kg)     ***Physical Exam  ASSESSMENT & PLAN:    Chronic combined systolic and diastolic heart failure (HCC)  Coronary artery disease of native artery of native heart with stable angina pectoris (HCC)  Bilateral carotid artery disease, unspecified type ([place]  Essential hypertension  Hyperlipidemia, unspecified hyperlipidemia type  Mitral valvular insufficiency and aortic valvular insufficiency  Chronic kidney disease, stage 3 ([place]*** Coronary artery disease of native artery of native heart with stable angina pectoris ([place] History of CABG.  He suffered a non-ST elevation myocardial infarction in September 2016 treated with drug-eluting stent x2 to the native LAD  and drug-eluting stent to the vein graft to the obtuse marginal.  He has a chronically occluded RCA and an occluded SVG-RCA.  He is currently doing well without anginal symptoms on beta-blocker and long-acting nitrate therapy.  Continue aspirin, carvedilol, ezetimibe, isosorbide.  Chronic combined systolic and diastolic heart failure (HCC) EF 35-40.  NYHA 2.  Volume status appears stable.  Continue current medical regimen.  Arrange follow-up BMET.  Hypertensive heart disease with chronic combined systolic and diastolic congestive heart failure ([place] The patient's blood pressure is controlled on his current regimen.  Continue current therapy.   Hyperlipidemia, unspecified hyperlipidemia type  He is currently tolerating ezetimibe.  Arrange follow-up fasting lipids and LFTs.  Bilateral carotid artery disease, unspecified type ([place] Recent carotid [place] mild bilateral disease.  Repeat will be obtained in 1 year.  Chronic kidney disease, stage 3 (  [place])  Most recent creatinine stable.  Arrange follow-up BMET in the next few weeks. Dispo:  No follow-ups on file.   Medication Adjustments/Labs and Tests Ordered: Current medicines are reviewed at length with the patient today.  Concerns regarding medicines are outlined above.  Tests Ordered: No orders of the defined types were placed in this encounter.  Medication Changes: No orders of the defined types were placed in this encounter.   Signed, [name], PA-C  04/12/2018 8:28 AM    [facility] [place], [place], [place]  [address] Phone: [phone]; Fax: [phone]

## 2018-04-13 ENCOUNTER — Ambulatory Visit: Payer: Medicare Other | Admitting: Physician Assistant

## 2018-04-19 ENCOUNTER — Encounter (INDEPENDENT_AMBULATORY_CARE_PROVIDER_SITE_OTHER): Payer: Self-pay

## 2018-04-19 ENCOUNTER — Ambulatory Visit: Payer: Medicare Other | Admitting: Physician Assistant

## 2018-04-19 ENCOUNTER — Encounter: Payer: Self-pay | Admitting: Physician Assistant

## 2018-04-19 VITALS — BP 120/60 | HR 60 | Ht 66.0 in | Wt 147.8 lb

## 2018-04-19 DIAGNOSIS — N183 Chronic kidney disease, stage 3 unspecified: Secondary | ICD-10-CM

## 2018-04-19 DIAGNOSIS — I5042 Chronic combined systolic (congestive) and diastolic (congestive) heart failure: Secondary | ICD-10-CM | POA: Diagnosis not present

## 2018-04-19 DIAGNOSIS — E785 Hyperlipidemia, unspecified: Secondary | ICD-10-CM | POA: Diagnosis not present

## 2018-04-19 DIAGNOSIS — I739 Peripheral vascular disease, unspecified: Secondary | ICD-10-CM

## 2018-04-19 DIAGNOSIS — I25118 Atherosclerotic heart disease of native coronary artery with other forms of angina pectoris: Secondary | ICD-10-CM | POA: Diagnosis not present

## 2018-04-19 DIAGNOSIS — I779 Disorder of arteries and arterioles, unspecified: Secondary | ICD-10-CM

## 2018-04-19 DIAGNOSIS — I110 Hypertensive heart disease with heart failure: Secondary | ICD-10-CM | POA: Diagnosis not present

## 2018-04-19 MED ORDER — NITROGLYCERIN 0.4 MG SL SUBL: SUBLINGUAL_TABLET | SUBLINGUAL | 5 refills | Status: AC

## 2018-04-19 NOTE — Progress Notes (Signed)
Cardiology Office Note:    Date:  04/19/2018   ID:  [name], DOB July 06, 1934, MRN [id number]  PCP:  Corey Frees, MD  Cardiologist:  Corey Bush, MD >> will establ with Dr. Acie Rivers / [name], PA-C  Electrophysiologist:  None   Referring MD: Corey Frees, MD   Chief Complaint  Patient presents with  . Follow-up    CAD    History of Present Illness:    [name] is a 83 y.o. male with coronary artery disease s/p CABG in 2003, aortic insufficiency, combined systolic and diastolic heart failure, HTN, HL, carotid artery disease.He has been nonadherent to follow-up and medications in the past. He has a history of elevated LFTs and total CK on statin therapy in the past.He had a NSTEMI in 9/16 andCardiac Catheterizationdemonstrated severe 3 vessel CAD with a chronically occluded LCx, RCA and severe subtotal occlusion of the native mid LAD, chronically occluded SVG-RCA and SVG-LAD with severe stenosis and SVG-OM. He underwent PCI with DES 2 in overlapping fashion to the native LAD as well as DES to the SVG-OM.  He was admitted in February 2019 for decompensated heart failure.  He was last seen in 11/2017.  FU Lipids demonstrated an elevated LDL on Ezetimibe.  I referred him to the [facility] but he cancelled the appointment.    Mr. [name] returns for follow-up.  He is here today with his wife.  Since last being seen, he has not had any chest discomfort, syncope, paroxysmal nocturnal dyspnea.  He has lower extremity swelling that worsens with prolonged standing.  He has shortness of breath with some activities.  Overall, his shortness of breath is stable.  He denies any bleeding issues.  Prior CV studies:   The following studies were reviewed today:  Carotid US 09/10/2017 Bilateral ICA 1-39; repeat 12 months  Echo 08/20/16 Mild conc LVH, EF 35-40, ant-sept and inf-sept HK, Gr 2 DD, mild AS (mean 13), mod AI, mild dilated asc aorta (43 mm), MAC, mod MR, severe LAE, mild TR,  mod PI, PASP 43  Carotid US 7/82:  RICA 42-35%; LICA 3-61% >> FU 1 year  Echo 05/31/15 Mild LVH, EF 40-45%, inf-septal HK, apical inf-septal AK, Gr 1 DD, mild AI, Aortic Sclerosis without stenosis (mean 15 mmHg), mod MAC, mild to mod reduced RVSF  Echo 01/28/15 Inferoseptal HK, inferior HK, apical AK, anteroseptal HK, moderate LVH, EF 44-31% grade 2 diastolic dysfunction, mild aortic stenosis (mean 9 mmHg), moderate AI, mildly dilated aortic root, ascending aortic diameter 40 mm (mildly dilated), mild MR, mild LAE, mild PI, PASP 36 mmHg  LHC 01/27/15 LM: 40% LAD: Mid 99%, 99% >>PCI:Synergy DES x 2 (overlapping) LCx: 100% RCA: 100% SVG-LAD: 100% SVG-OM2 90% >>PCI:Synergy DES SVG-RCA 100% EF 25-35%, 4+ MR  Abd Aorta US 09/19/13 No AAA  Carotid US 09/19/13 Bilateral ICA 1-39% FU 2 years (due in 09/2015)  Past Medical History:  Diagnosis Date  . CAD (coronary artery disease)    a. CABG (2003)  b. 9/17/6: NSTEMI s/p overlapping DES x2 to LAD and DESx1 to SVG-->OM   . Carotid artery disease ([place])    a. 09/2013: carotid dopplers with stable 1-39% ICA stenosis bilaterally  //  b. Carotid US 5/40: RICA 08-67%; LICA 6-19% >> FU 1 year // c. Carotid US 09/2016: Bilateral ICA 1-39 >> follow-up 1 year  //  d. Carotid US 5/19: Bilateral ICA 1-39; decreased flow in right subclavian artery   . CHF (congestive heart failure) ([place])   .  Dementia ([place])   . History of echocardiogram    Echo 4/18: mild conc LVH, EF 35-40, diff HK, anteroseptal and inf-septal HK, Gr 2 DD, mild AS (mean 13), mod AI, mildly dilated asc Ao (43 mm), mod MAC, mod MR, severe LAE, mild TR, mod PI, PASP 43  . Hyperlipidemia   . Hypertension   . Internal hemorrhoids   . Ischemic cardiomyopathy    a. 2D ECHO 01/28/15 w/ EF 25-30%, severe HK of the basal-midinferoseptal myocardium, basal-midinferior myocardium and midanteroseptal myocardium. Akinesis of apical inferior and apicalanterior myocardium. G2DD. Mild AS, Asc aorta  diameter 16m. Mild dilation of aortic root. Mild MR, mild PR and PA peak pressure 36 mmg HG.  . Peptic ulcer disease   . Prostate CA ([facility]    a. s/p seed implants   Surgical Hx: The patient  has a past surgical history that includes Cardiac surgery (2003) and Cardiac catheterization (N/A, 01/27/2015).   Current Medications: Current Meds  Medication Sig  . aspirin EC 81 MG tablet Take 1 tablet (81 mg total) by mouth daily.  . carvedilol (COREG) 6.25 MG tablet Take 1 tablet (6.25 mg total) by mouth 2 (two) times daily with a meal.  . ezetimibe (ZETIA) 10 MG tablet Take 1 tablet (10 mg total) by mouth daily.  . furosemide (LASIX) 40 MG tablet Take 1 tablet (40 mg total) by mouth daily.  . isosorbide mononitrate (IMDUR) 30 MG 24 hr tablet Take 1 tablet (30 mg total) by mouth daily.  .Marland Kitchenloratadine (CLARITIN) 10 MG tablet Take 10 mg by mouth daily.  . nitroGLYCERIN (NITROSTAT) 0.4 MG SL tablet place 1 tablet under the tongue if needed every 5 minutes for chest pain  UP TO 3 DOSES  . PROAIR HFA 108 (90 Base) MCG/ACT inhaler Inhale 2 puffs into the lungs every six hours as needed for shortness of breath or wheezing  . [DISCONTINUED] nitroGLYCERIN (NITROSTAT) 0.4 MG SL tablet place 1 tablet under the tongue if needed every 5 minutes for chest pain  UP TO 3 DOSES     Allergies:   Ramipril; Statins; and Latex   Social History   Tobacco Use  . Smoking status: Former SResearch scientist (life sciences) . Smokeless tobacco: Never Used  Substance Use Topics  . Alcohol use: No  . Drug use: No     Family Hx: The patient's family history includes Stroke in his father.  ROS:   Please see the history of present illness.    ROS All other systems reviewed and are negative.   EKGs/Labs/Other Test Reviewed:    EKG:  EKG is  ordered today.  The ekg ordered today demonstrates normal sinus rhythm, heart rate 60, left axis deviation, QTC 412, nonspecific ST-T wave changes, no change from prior tracing  Recent Labs: 06/19/2017:  B Natriuretic Peptide 1,224.9 08/03/2017: Hemoglobin 11.1; Platelets 155 12/21/2017: ALT 14; BUN 32; Creatinine, Ser 1.74; Potassium 4.3; Sodium 138   Recent Lipid Panel Lab Results  Component Value Date/Time   CHOL 177 12/21/2017 08:07 AM   TRIG 63 12/21/2017 08:07 AM   HDL 62 12/21/2017 08:07 AM   CHOLHDL 2.9 12/21/2017 08:07 AM   CHOLHDL 6.8 (H) 07/27/2015 08:45 AM   LDLCALC 102 (H) 12/21/2017 08:07 AM   LDLDIRECT 126 (H) 09/07/2017 10:18 AM    Physical Exam:    VS:  BP 120/60   Pulse 60   Ht _0  (1.676 m)   Wt 147 lb 12.8 oz (67 kg)   BMI 23.86  kg/m     Wt Readings from Last 3 Encounters:  04/19/18 147 lb 12.8 oz (67 kg)  12/08/17 141 lb 12.8 oz (64.3 kg)  09/07/17 145 lb 9.6 oz (66 kg)     Physical Exam  Constitutional: He is oriented to person, place, and time. He appears well-developed and well-nourished. No distress.  HENT:  Head: Normocephalic and atraumatic.  Eyes: No scleral icterus.  Neck: No JVD present. No thyromegaly present.  Cardiovascular: Normal rate and regular rhythm.  No murmur heard. Pulmonary/Chest: Effort normal. He has no rales.  Abdominal: Soft. He exhibits no distension.  Musculoskeletal: He exhibits edema (1+ bilat ankle edema).  Lymphadenopathy:    He has no cervical adenopathy.  Neurological: He is alert and oriented to person, place, and time.  Skin: Skin is warm and dry.  Psychiatric: He has a normal mood and affect.    ASSESSMENT & PLAN:    Coronary artery disease of native artery of native heart with stable angina pectoris ([place]) History of CABG.  He suffered a non-ST elevation myocardial infarction in September 2016 treated with drug-eluting stent x2 to the native LAD and drug-eluting stent to the vein graft to the obtuse marginal.  He has a chronically occluded RCA and an occluded SVG-RCA.  He is not having any anginal symptoms.  Continue ASA, beta-blocker, nitrates, Ezetimibe.   Chronic combined systolic and diastolic heart  failure (HCC) EF 35-40.  NYHA 2.  Volume is stable.  He has some leg swelling related to venous insufficiency.  His BP has limited titration of medications in the past.  Continue Carvedilol, nitrates.   Hypertensive heart disease with chronic combined systolic and diastolic congestive heart failure ([place]) The patient's blood pressure is controlled on his current regimen.  Continue current therapy.    Hyperlipidemia, unspecified hyperlipidemia type Intol of statins due to elevated LFTs and CPK in the past.  Continue Ezetimibe.    Bilateral carotid artery disease, unspecified type ([place]) Carotid US due in 09/2018.  Chronic kidney disease, stage 3 (HCC) Recent creatinine stable.   Dispo:  Return in about 6 months (around 10/19/2018) for Routine Follow Up, w/ [name], PA-C.   Medication Adjustments/Labs and Tests Ordered: Current medicines are reviewed at length with the patient today.  Concerns regarding medicines are outlined above.  Tests Ordered: Orders Placed This Encounter  Procedures  . EKG 12-Lead   Medication Changes: Meds ordered this encounter  Medications  . nitroGLYCERIN (NITROSTAT) 0.4 MG SL tablet    Sig: place 1 tablet under the tongue if needed every 5 minutes for chest pain  UP TO 3 DOSES    Dispense:  25 tablet    Refill:  5    Signed, [name], PA-C  04/19/2018 [facility] [address], [place], [place]  [address] Phone: [phone]; Fax: [phone]

## 2018-04-19 NOTE — Patient Instructions (Addendum)
Medication Instructions:  No changes   If you need a refill on your cardiac medications before your next appointment, please call your pharmacy.   Lab work: None   If you have labs (blood work) drawn today and your tests are completely normal, you will receive your results only by: Marland Kitchen MyChart Message (if you have MyChart) OR . A paper copy in the mail If you have any lab test that is abnormal or we need to change your treatment, we will call you to review the results.  Testing/Procedures: None   Follow-Up: At [facility], you and your health needs are our priority.  As part of our continuing mission to provide you with exceptional heart care, we have created designated Provider Care Teams.  These Care Teams include your primary Cardiologist (physician) and Advanced Practice Providers (APPs -  Physician Assistants and Nurse Practitioners) who all work together to provide you with the care you need, when you need it. [name], PA-C in 6 months ON 10-19-2018 @ 2:45 PM  Any Other Special Instructions Will Be Listed Below (If Applicable). Get compression stockings over the counter (knee high).  Wear them from morning to night to prevent swelling in your legs.

## 2018-07-05 ENCOUNTER — Other Ambulatory Visit: Payer: Self-pay | Admitting: Family Medicine

## 2018-07-05 ENCOUNTER — Ambulatory Visit
Admission: RE | Admit: 2018-07-05 | Discharge: 2018-07-05 | Disposition: A | Discharge status: Home / Self Care | Payer: Medicare Other | Source: Ambulatory Visit | Attending: Family Medicine | Admitting: Family Medicine

## 2018-07-05 DIAGNOSIS — J069 Acute upper respiratory infection, unspecified: Secondary | ICD-10-CM

## 2018-07-22 ENCOUNTER — Other Ambulatory Visit: Payer: Self-pay

## 2018-07-22 MED ORDER — FUROSEMIDE 40 MG PO TABS: 40.0000 mg | ORAL_TABLET | Freq: Every day | ORAL | 2 refills | Status: DC

## 2018-09-27 ENCOUNTER — Other Ambulatory Visit: Payer: Self-pay | Admitting: Internal Medicine

## 2018-09-27 DIAGNOSIS — I6523 Occlusion and stenosis of bilateral carotid arteries: Secondary | ICD-10-CM

## 2018-09-29 ENCOUNTER — Other Ambulatory Visit: Payer: Self-pay | Admitting: Physician Assistant

## 2018-09-29 MED ORDER — ISOSORBIDE MONONITRATE ER 30 MG PO TB24: 30.0000 mg | ORAL_TABLET | Freq: Every day | ORAL | 2 refills | Status: AC

## 2018-10-05 ENCOUNTER — Telehealth: Payer: Self-pay | Admitting: Physician Assistant

## 2018-10-05 ENCOUNTER — Other Ambulatory Visit: Payer: Self-pay

## 2018-10-05 ENCOUNTER — Encounter: Payer: Self-pay | Admitting: Cardiology

## 2018-10-05 ENCOUNTER — Ambulatory Visit (INDEPENDENT_AMBULATORY_CARE_PROVIDER_SITE_OTHER): Payer: Medicare Other | Admitting: Cardiology

## 2018-10-05 ENCOUNTER — Telehealth: Payer: Self-pay | Admitting: *Deleted

## 2018-10-05 VITALS — BP 116/56 | Ht 66.0 in | Wt 145.8 lb

## 2018-10-05 DIAGNOSIS — I5042 Chronic combined systolic (congestive) and diastolic (congestive) heart failure: Secondary | ICD-10-CM | POA: Diagnosis not present

## 2018-10-05 DIAGNOSIS — R55 Syncope and collapse: Secondary | ICD-10-CM

## 2018-10-05 DIAGNOSIS — I38 Endocarditis, valve unspecified: Secondary | ICD-10-CM

## 2018-10-05 DIAGNOSIS — I25708 Atherosclerosis of coronary artery bypass graft(s), unspecified, with other forms of angina pectoris: Secondary | ICD-10-CM

## 2018-10-05 LAB — CBC
Hematocrit: 36.8 % — ABNORMAL LOW (ref 37.5–51.0)
Hemoglobin: 11.8 g/dL — ABNORMAL LOW (ref 13.0–17.7)
MCH: 27.9 pg (ref 26.6–33.0)
MCHC: 32.1 g/dL (ref 31.5–35.7)
MCV: 87 fL (ref 79–97)
Platelets: 197 10*3/uL (ref 150–450)
RBC: 4.23 x10E6/uL (ref 4.14–5.80)
RDW: 14.5 % (ref 11.6–15.4)
WBC: 5.5 10*3/uL (ref 3.4–10.8)

## 2018-10-05 NOTE — Telephone Encounter (Signed)
[employer] to ship a 30 day cardiac event monitor to [address] [place], [place] [address].  Instructions reviewed briefly as they are included in the monitor kit.  Patient instructed to call [employer] at [phone] to send his baseline recording once he receives his heart monitor.

## 2018-10-05 NOTE — Patient Instructions (Signed)
Medication Instructions:  Your physician recommends that you continue on your current medications as directed. Please refer to the Current Medication list given to you today.  If you need a refill on your cardiac medications before your next appointment, please call your pharmacy.   Lab work: TODAY: BNP, BMET, CBC & TSH  If you have labs (blood work) drawn today and your tests are completely normal, you will receive your results only by: Marland Kitchen MyChart Message (if you have MyChart) OR . A paper copy in the mail If you have any lab test that is abnormal or we need to change your treatment, we will call you to review the results.  Testing/Procedures: Your physician has requested that you have a carotid duplex on 10/18/2018. This test is an ultrasound of the carotid arteries in your neck. It looks at blood flow through these arteries that supply the brain with blood. Allow one hour for this exam. There are no restrictions or special instructions.   Your physician has requested that you have an echocardiogram. Echocardiography is a painless test that uses sound waves to create images of your heart. It provides your doctor with information about the size and shape of your heart and how well your heart's chambers and valves are working. This procedure takes approximately one hour. There are no restrictions for this procedure.   Your physician has recommended that you wear an event monitor. Event monitors are medical devices that record the heart's electrical activity. Doctors most often [place] these monitors to diagnose arrhythmias. Arrhythmias are problems with the speed or rhythm of the heartbeat. The monitor is a small, portable device. You can wear one while you do your normal daily activities. This is usually used to diagnose what is causing palpitations/syncope (passing out).     Follow-Up: You are scheduled to see [name] PA-C on 10/19/2018  Any Other Special Instructions Will Be Listed Below  (If Applicable).

## 2018-10-05 NOTE — Telephone Encounter (Signed)
Called patient's daughter back about patient's symptoms. Patient has had 3 passing out spells since March 25th, with the last one being this passed Sunday. Patient's daughter stated that patient was starring out and not responding then finally came to and seemed fine. She states that his carvedilol manufacturer changed back in March and she is wondering if that has something to do with it. Patient's BP and HR on Sunday were 133/74 and 57, and 117/62 and 57. Patient's daughter decided not to give patient his carvedilol today and BP is 145/85 and HR 70. Informed patient's daughter that if it was the medication, that these episodes would happen more frequently, since he takes it twice daily. Informed her that patient's BP and HR were good on Sunday. Encouraged her to give patient his carvedilol today since his BP is up. Daughter has been called back to work, and is suppose to start work today, but she is afraid to leave her dad by himself. She would like to see if patient can be seen today. Found an appointment with Pecolia Ades NP this afternoon, daughter was agreeable to this appointment. Informed her that patient would have to come up to the office by himself and wear a mask, but we could call her with details after the visit or have her on speaker phone, it would be up to the provider.

## 2018-10-05 NOTE — Telephone Encounter (Signed)
Wife called on behalf of patient. She said her husband has spells when he seems to go unconscious. His eyes seem to roll back and he looks out of it.  The wife wanted to know if he could be seen today, because she was supposed to go back to work, but does not want to leave him home alone

## 2018-10-05 NOTE — Progress Notes (Signed)
Cardiology Office Note:    Date:  10/05/2018   ID:  [name], DOB 02-08-1935, MRN [id number]  PCP:  Shirline Frees, MD  Cardiologist:  Mertie Moores, MD  Referring MD: Shirline Frees, MD   Chief Complaint  Patient presents with  . Loss of Consciousness    History of Present Illness:    [name] is a 84 y.o. male with a past medical history significant for coronary artery diseases/p CABG in 2003, aortic insufficiency,combined systolic and diastolic heart failure,HTN, HL, carotid artery disease.He has been nonadherent to follow-up and medications in the past. He also has CKD. He has a history of elevated LFTs and total CK on statin therapy in the past.He had a NSTEMI in 9/16 andCardiac Catheterizationdemonstrated severe 3 vessel CAD with a chronically occluded LCx, RCA and severe subtotal occlusion of the native mid LAD, chronically occluded SVG-RCA and SVG-LAD with severe stenosis and SVG-OM. He underwent PCI with DES 2 in overlapping fashion to the native LAD as well as DES to the SVG-OM.He was admitted in February 2019 for decompensated heart failure.    According to his wife he apparently passed out in March. He saw his PCP at [facility]. His wife does not recall what they said. He seemed to be better at the appointment. The patient is unable to give any useful details and his wife is very vague.   On Sunday his wife says that the patient was sitting down reading his bible and listening to the radio. His eyes rolled back and he was not responding to calling his name and patting him. This lasted a minute or 2. The patient is very vague and confusing different occurrences. He is not able to provide much valuable information. He does not recall any symptoms associated. He was able to say who he was and recognized his wife after coming around. Since Sunday he has had no further syncope. His wife has been watching him closely.   He denies any chest pain or shortness of breath.   His wife says that he tries to do more than he is able, like wanting to use a chainsaw. 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 He got tired and had to rest several times.   He sleeps in a reliner possibly to watch TV. He is unable to tell me if he has shortness of breath with lying down. He has trace edema to just above ankles.   Past Medical History:  Diagnosis Date  . CAD (coronary artery disease)    a. CABG (2003)  b. 9/17/6: NSTEMI s/p overlapping DES x2 to LAD and DESx1 to SVG-->OM   . Carotid artery disease ([place])    a. 09/2013: carotid dopplers with stable 1-39% ICA stenosis bilaterally  //  b. Carotid US 7/42: RICA 59-56%; LICA 3-87% >> FU 1 year // c. Carotid US 09/2016: Bilateral ICA 1-39 >> follow-up 1 year  //  d. Carotid US 5/19: Bilateral ICA 1-39; decreased flow in right subclavian artery   . CHF (congestive heart failure) ([place])   . Dementia ([place])   . History of echocardiogram    Echo 4/18: mild conc LVH, EF 35-40, diff HK, anteroseptal and inf-septal HK, Gr 2 DD, mild AS (mean 13), mod AI, mildly dilated asc Ao (43 mm), mod MAC, mod MR, severe LAE, mild TR, mod PI, PASP 43  . Hyperlipidemia   . Hypertension   . Internal hemorrhoids   . Ischemic cardiomyopathy    a. 2D  ECHO 01/28/15 w/ EF 25-30%, severe HK of the basal-midinferoseptal myocardium, basal-midinferior myocardium and midanteroseptal myocardium. Akinesis of apical inferior and apicalanterior myocardium. G2DD. Mild AS, Asc aorta diameter 6m. Mild dilation of aortic root. Mild MR, mild PR and PA peak pressure 36 mmg HG.  . Peptic ulcer disease   . Prostate CA ([facility]    a. s/p seed implants    Past Surgical History:  Procedure Laterality Date  . CARDIAC CATHETERIZATION N/A 01/27/2015   Procedure: Left Heart Cath and Coronary Angiography;  Surgeon: MWellington Hampshire MD;  [facility];  Service: Cardiovascular;  Laterality: N/A;  . CARDIAC SURGERY  2003   SVG LAD, SVG to right  coronary artery, SVG to marginal    Current Medications: Current Meds  Medication Sig  . aspirin EC 81 MG tablet Take 1 tablet (81 mg total) by mouth daily.  . carvedilol (COREG) 6.25 MG tablet Take 1 tablet (6.25 mg total) by mouth 2 (two) times daily with a meal.  . ezetimibe (ZETIA) 10 MG tablet Take 1 tablet (10 mg total) by mouth daily.  . furosemide (LASIX) 40 MG tablet Take 1 tablet (40 mg total) by mouth daily.  . isosorbide mononitrate (IMDUR) 30 MG 24 hr tablet Take 1 tablet (30 mg total) by mouth daily.  .Marland Kitchenloratadine (CLARITIN) 10 MG tablet Take 10 mg by mouth daily.  . nitroGLYCERIN (NITROSTAT) 0.4 MG SL tablet place 1 tablet under the tongue if needed every 5 minutes for chest pain  UP TO 3 DOSES  . PROAIR HFA 108 (90 Base) MCG/ACT inhaler Inhale 2 puffs into the lungs every six hours as needed for shortness of breath or wheezing     Allergies:   Ramipril; Statins; and Latex   Social History   Socioeconomic History  . Marital status: Married    Spouse name: Not on file  . Number of children: Not on file  . Years of education: Not on file  . Highest education level: Not on file  Occupational History  . Occupation: Retired  SScientific laboratory technician . Financial resource strain: Not on file  . Food insecurity:    Worry: Not on file    Inability: Not on file  . Transportation needs:    Medical: Not on file    Non-medical: Not on file  Tobacco Use  . Smoking status: Former SResearch scientist (life sciences) . Smokeless tobacco: Never Used  Substance and Sexual Activity  . Alcohol use: No  . Drug use: No  . Sexual activity: Not on file  Lifestyle  . Physical activity:    Days per week: Not on file    Minutes per session: Not on file  . Stress: Not on file  Relationships  . Social connections:    Talks on phone: Not on file    Gets together: Not on file    Attends religious service: Not on file    Active member of club or organization: Not on file    Attends meetings of clubs or organizations:  Not on file    Relationship status: Not on file  Other Topics Concern  . Not on file  Social History Narrative  . Not on file     Family History: The patient's family history includes Stroke in his father. ROS:   Please see the history of present illness.     All other systems reviewed and are negative.  EKGs/Labs/Other Studies Reviewed:    The following studies were reviewed today:  Carotid US 09/10/2017 Bilateral ICA 1-39; repeat 12 months  Echo 08/20/16 Mild conc LVH, EF 35-40, ant-sept and inf-sept HK, Gr 2 DD, mild AS (mean 13), mod AI, mild dilated asc aorta (43 mm), MAC, mod MR, severe LAE, mild TR, mod PI, PASP 43  Carotid US 7/48:  RICA 27-07%; LICA 8-67% >> FU 1 year  Echo 05/31/15 Mild LVH, EF 40-45%, inf-septal HK, apical inf-septal AK, Gr 1 DD, mild AI, Aortic Sclerosis without stenosis (mean 15 mmHg), mod MAC, mild to mod reduced RVSF  Echo 01/28/15 Inferoseptal HK, inferior HK, apical AK, anteroseptal HK, moderate LVH, EF 54-49% grade 2 diastolic dysfunction, mild aortic stenosis (mean 9 mmHg), moderate AI, mildly dilated aortic root, ascending aortic diameter 40 mm (mildly dilated), mild MR, mild LAE, mild PI, PASP 36 mmHg  LHC 01/27/15 LM: 40% LAD: Mid 99%, 99% >>PCI:Synergy DES x 2 (overlapping) LCx: 100% RCA: 100% SVG-LAD: 100% SVG-OM2 90% >>PCI:Synergy DES SVG-RCA 100% EF 25-35%, 4+ MR  Abd Aorta US 09/19/13 No AAA  Carotid US 09/19/13 Bilateral ICA 1-39% FU 2 years (due in 09/2015)  EKG:  EKG is ordered today.  The ekg ordered today demonstrates sinus bradycardia with LAD and LVH. No significant changes from prior.   Recent Labs: 12/21/2017: ALT 14; BUN 32; Creatinine, Ser 1.74; Potassium 4.3; Sodium 138   Recent Lipid Panel    Component Value Date/Time   CHOL 177 12/21/2017 0807   TRIG 63 12/21/2017 0807   HDL 62 12/21/2017 0807   CHOLHDL 2.9 12/21/2017 0807   CHOLHDL 6.8 (H) 07/27/2015 0845   VLDL 23 07/27/2015 0845   LDLCALC 102  (H) 12/21/2017 0807   LDLDIRECT 126 (H) 09/07/2017 1018    Physical Exam:    VS:  Ht _0  (1.676 m)   Wt 145 lb 12.8 oz (66.1 kg)   SpO2 99%   BMI 23.53 kg/m     Wt Readings from Last 3 Encounters:  10/05/18 145 lb 12.8 oz (66.1 kg)  04/19/18 147 lb 12.8 oz (67 kg)  12/08/17 141 lb 12.8 oz (64.3 kg)     Physical Exam  Constitutional: He is oriented to person, place, and time. He appears well-developed and well-nourished.  HENT:  Head: Normocephalic and atraumatic.  Neck: Normal range of motion. Neck supple. No JVD present.  Cardiovascular: Normal rate and regular rhythm. Exam reveals no gallop and no friction rub.  Murmur heard.  Midsystolic murmur is present with a grade of 2/6 at the upper right sternal border radiating to the neck. Pulmonary/Chest: Effort normal and breath sounds normal. No respiratory distress. He has no wheezes. He has no rales.  Abdominal: Soft. Bowel sounds are normal.  Musculoskeletal: Normal range of motion.        General: Edema present.     Comments: Trace lower leg edema to just above ankles bilat.   Neurological: He is alert and oriented to person, place, and time.  Pt forgetful and poor historian. He confuses his stories.   Skin: Skin is warm and dry.  Psychiatric: He has a normal mood and affect. His behavior is normal.  Vitals reviewed.    ASSESSMENT:    1. Syncope, unspecified syncope type   2. Chronic combined systolic and diastolic heart failure (HCC)   3. Valvular heart disease   4. Coronary artery disease of bypass graft of native heart with stable angina pectoris ([place])    PLAN:    In order of problems listed above:  1. ?Syncope -Pt  with a couple of episodes of becoming unresponsive briefly, at rest. Not orthostatic.  -Possibility arrhythmia related or symptomatic bradycardia. Will check 30 day cardiac event monitor. -No apparent chest pain and no shortness of breath or heart failure.  -Will check labs for electrolyte  excursions and thyroid function.  -Carotid dopplers already ordered for 10/18/18.  -Will place referral to neurology due to nature of the syncope- eyes rolling back and pt unresponsive to verbal and tactile stimulation.  -Wife Instructed to call 911 if further episodes.   2. Chronic systolic heart failure -EF 35-40% by last echo. -Pt does not have significant heart failure type symptoms, but will update echo.   3. Valvular disease -Moderate AR, mild AS with mean gradient of 13 on last echo in 08/2016. Moder MR. -Even though no heart failure type symptoms, will check echo to make sure that AS has not worsened causing his symptoms.   4. CAD -History of CABG. He suffered a non-ST elevation myocardial infarction in September 2016 treated with drug-eluting stent x2 to the native LAD and drug-eluting stent to the vein graft to the obtuse marginal. He has a chronically occluded RCA and an occluded SVG-RCA.  -No apparent angina.   Medication Adjustments/Labs and Tests Ordered: Current medicines are reviewed at length with the patient today.  Concerns regarding medicines are outlined above. Labs and tests ordered and medication changes are outlined in the patient instructions below:  Patient Instructions  Medication Instructions:  Your physician recommends that you continue on your current medications as directed. Please refer to the Current Medication list given to you today.  If you need a refill on your cardiac medications before your next appointment, please call your pharmacy.   Lab work: TODAY: BNP, BMET, CBC & TSH  If you have labs (blood work) drawn today and your tests are completely normal, you will receive your results only by: Marland Kitchen MyChart Message (if you have MyChart) OR . A paper copy in the mail If you have any lab test that is abnormal or we need to change your treatment, we will call you to review the results.  Testing/Procedures: Your physician has requested that you have a  carotid duplex on 10/18/2018. This test is an ultrasound of the carotid arteries in your neck. It looks at blood flow through these arteries that supply the brain with blood. Allow one hour for this exam. There are no restrictions or special instructions.   Your physician has requested that you have an echocardiogram. Echocardiography is a painless test that uses sound waves to create images of your heart. It provides your doctor with information about the size and shape of your heart and how well your heart's chambers and valves are working. This procedure takes approximately one hour. There are no restrictions for this procedure.   Your physician has recommended that you wear an event monitor. Event monitors are medical devices that record the heart's electrical activity. Doctors most often [place] these monitors to diagnose arrhythmias. Arrhythmias are problems with the speed or rhythm of the heartbeat. The monitor is a small, portable device. You can wear one while you do your normal daily activities. This is usually used to diagnose what is causing palpitations/syncope (passing out).     Follow-Up: You are scheduled to see [name] PA-C on 10/19/2018  Any Other Special Instructions Will Be Listed Below (If Applicable).       Signed, Daune Perch, NP  10/05/2018 [facility] 

## 2018-10-06 ENCOUNTER — Telehealth: Payer: Self-pay

## 2018-10-06 DIAGNOSIS — R55 Syncope and collapse: Secondary | ICD-10-CM

## 2018-10-06 LAB — TSH: TSH: 5.52 u[IU]/mL — ABNORMAL HIGH (ref 0.450–4.500)

## 2018-10-06 LAB — BASIC METABOLIC PANEL
BUN/Creatinine Ratio: 19 (ref 10–24)
BUN: 31 mg/dL — ABNORMAL HIGH (ref 8–27)
CO2: 23 mmol/L (ref 20–29)
Calcium: 9.3 mg/dL (ref 8.6–10.2)
Chloride: 104 mmol/L (ref 96–106)
Creatinine, Ser: 1.64 mg/dL — ABNORMAL HIGH (ref 0.76–1.27)
GFR calc Af Amer: 45 mL/min/{1.73_m2} — ABNORMAL LOW (ref >59)
GFR calc non Af Amer: 39 mL/min/{1.73_m2} — ABNORMAL LOW (ref >59)
Glucose: 96 mg/dL (ref 65–99)
Potassium: 4.5 mmol/L (ref 3.5–5.2)
Sodium: 141 mmol/L (ref 134–144)

## 2018-10-06 LAB — PRO B NATRIURETIC PEPTIDE: NT-Pro BNP: 12132 pg/mL — ABNORMAL HIGH (ref 0–486)

## 2018-10-06 NOTE — Telephone Encounter (Signed)
Referral to neurology per Pecolia Ades, NP

## 2018-10-07 ENCOUNTER — Telehealth: Payer: Self-pay

## 2018-10-07 DIAGNOSIS — I080 Rheumatic disorders of both mitral and aortic valves: Secondary | ICD-10-CM

## 2018-10-07 DIAGNOSIS — I110 Hypertensive heart disease with heart failure: Secondary | ICD-10-CM

## 2018-10-07 DIAGNOSIS — I10 Essential (primary) hypertension: Secondary | ICD-10-CM

## 2018-10-07 NOTE — Telephone Encounter (Signed)
Lab orders

## 2018-10-08 ENCOUNTER — Telehealth (HOSPITAL_COMMUNITY): Payer: Self-pay | Admitting: Cardiology

## 2018-10-08 NOTE — Telephone Encounter (Signed)

## 2018-10-11 ENCOUNTER — Other Ambulatory Visit: Payer: Self-pay

## 2018-10-11 ENCOUNTER — Ambulatory Visit (HOSPITAL_COMMUNITY): Payer: Medicare Other | Attending: Cardiology

## 2018-10-11 ENCOUNTER — Other Ambulatory Visit: Payer: Medicare Other | Admitting: *Deleted

## 2018-10-11 DIAGNOSIS — R55 Syncope and collapse: Secondary | ICD-10-CM | POA: Diagnosis present

## 2018-10-11 DIAGNOSIS — I5042 Chronic combined systolic (congestive) and diastolic (congestive) heart failure: Secondary | ICD-10-CM | POA: Insufficient documentation

## 2018-10-11 DIAGNOSIS — I110 Hypertensive heart disease with heart failure: Secondary | ICD-10-CM

## 2018-10-11 DIAGNOSIS — I10 Essential (primary) hypertension: Secondary | ICD-10-CM

## 2018-10-11 LAB — BASIC METABOLIC PANEL
BUN/Creatinine Ratio: 25 — ABNORMAL HIGH (ref 10–24)
BUN: 43 mg/dL — ABNORMAL HIGH (ref 8–27)
CO2: 24 mmol/L (ref 20–29)
Calcium: 9 mg/dL (ref 8.6–10.2)
Chloride: 99 mmol/L (ref 96–106)
Creatinine, Ser: 1.74 mg/dL — ABNORMAL HIGH (ref 0.76–1.27)
GFR calc Af Amer: 42 mL/min/{1.73_m2} — ABNORMAL LOW (ref >59)
GFR calc non Af Amer: 36 mL/min/{1.73_m2} — ABNORMAL LOW (ref >59)
Glucose: 92 mg/dL (ref 65–99)
Potassium: 3.6 mmol/L (ref 3.5–5.2)
Sodium: 140 mmol/L (ref 134–144)

## 2018-10-11 LAB — T4, FREE: Free T4: 1.03 ng/dL (ref 0.82–1.77)

## 2018-10-15 ENCOUNTER — Telehealth: Payer: Self-pay | Admitting: *Deleted

## 2018-10-15 NOTE — Telephone Encounter (Signed)
Lvm for patient to call back about virtual office set up

## 2018-10-18 ENCOUNTER — Other Ambulatory Visit: Payer: Self-pay

## 2018-10-18 ENCOUNTER — Ambulatory Visit (HOSPITAL_COMMUNITY)
Admission: RE | Admit: 2018-10-18 | Discharge: 2018-10-18 | Disposition: A | Discharge status: Home / Self Care | Payer: Medicare Other | Source: Ambulatory Visit | Attending: Cardiology | Admitting: Cardiology

## 2018-10-18 ENCOUNTER — Other Ambulatory Visit (HOSPITAL_COMMUNITY): Payer: Self-pay | Admitting: Internal Medicine

## 2018-10-18 DIAGNOSIS — I6523 Occlusion and stenosis of bilateral carotid arteries: Secondary | ICD-10-CM

## 2018-10-19 ENCOUNTER — Ambulatory Visit (INDEPENDENT_AMBULATORY_CARE_PROVIDER_SITE_OTHER): Payer: Medicare Other | Admitting: Physician Assistant

## 2018-10-19 ENCOUNTER — Encounter: Payer: Self-pay | Admitting: Physician Assistant

## 2018-10-19 ENCOUNTER — Telehealth: Payer: Self-pay | Admitting: *Deleted

## 2018-10-19 VITALS — BP 126/69 | HR 71 | Ht 66.0 in | Wt 135.0 lb

## 2018-10-19 DIAGNOSIS — I10 Essential (primary) hypertension: Secondary | ICD-10-CM

## 2018-10-19 DIAGNOSIS — Z7189 Other specified counseling: Secondary | ICD-10-CM

## 2018-10-19 DIAGNOSIS — I5042 Chronic combined systolic (congestive) and diastolic (congestive) heart failure: Secondary | ICD-10-CM | POA: Diagnosis not present

## 2018-10-19 DIAGNOSIS — R55 Syncope and collapse: Secondary | ICD-10-CM | POA: Diagnosis not present

## 2018-10-19 DIAGNOSIS — I739 Peripheral vascular disease, unspecified: Secondary | ICD-10-CM

## 2018-10-19 DIAGNOSIS — E785 Hyperlipidemia, unspecified: Secondary | ICD-10-CM

## 2018-10-19 DIAGNOSIS — I25118 Atherosclerotic heart disease of native coronary artery with other forms of angina pectoris: Secondary | ICD-10-CM | POA: Diagnosis not present

## 2018-10-19 DIAGNOSIS — I38 Endocarditis, valve unspecified: Secondary | ICD-10-CM

## 2018-10-19 DIAGNOSIS — N183 Chronic kidney disease, stage 3 (moderate): Secondary | ICD-10-CM

## 2018-10-19 NOTE — Telephone Encounter (Signed)
Late entry: Spoke with patient and wife [name] (DPR on file) earlier this morning to discuss appointment for today and also obtain consent. Consent obtained as below. I thoroughly explained telephone vs video visit and wife verbalized her understanding and indicated that she would discuss with their daughter to see if she would be available to assist them with doing a video visit on patients smartphone.  ..   Virtual Visit Pre-Appointment Phone Call  "(Name), I am calling you today to discuss your upcoming appointment. We are currently trying to limit exposure to the virus that causes COVID-19 by seeing patients at home rather than in the office."  1. "What is the BEST phone number to call the day of the visit?" - include this in appointment notes  2. "Do you have or have access to (through a family member/friend) a smartphone with video capability that we can use for your visit?" a. If yes - list this number in appt notes as "cell" (if different from BEST phone #) and list the appointment type as a VIDEO visit in appointment notes b. If no - list the appointment type as a PHONE visit in appointment notes  3. Confirm consent - "In the setting of the current Covid19 crisis, you are scheduled for a (phone or video) visit with your provider on (date) at (time).  Just as we do with many in-office visits, in order for you to participate in this visit, we must obtain consent.  If you'd like, I can send this to your mychart (if signed up) or email for you to review.  Otherwise, I can obtain your verbal consent now.  All virtual visits are billed to your insurance company just like a normal visit would be.  By agreeing to a virtual visit, we'd like you to understand that the technology does not allow for your provider to perform an examination, and thus may limit your provider's ability to fully assess your condition. If your provider identifies any concerns that need to be evaluated in person, we will make  arrangements to do so.  Finally, though the technology is pretty good, we cannot assure that it will always work on either your or our end, and in the setting of a video visit, we may have to convert it to a phone-only visit.  In either situation, we cannot ensure that we have a secure connection.  Are you willing to proceed?" STAFF: Did the patient verbally acknowledge consent to telehealth visit? Document YES/NO here: YES  4. Advise patient to be prepared - "Two hours prior to your appointment, go ahead and check your blood pressure, pulse, oxygen saturation, and your weight (if you have the equipment to check those) and write them all down. When your visit starts, your provider will ask you for this information. If you have an Apple Watch or Kardia device, please plan to have heart rate information ready on the day of your appointment. Please have a pen and paper handy nearby the day of the visit as well."  5. Give patient instructions for MyChart download to smartphone OR Doximity/Doxy.me as below if video visit (depending on what platform provider is using)  6. Inform patient they will receive a phone call 15 minutes prior to their appointment time (may be from unknown caller ID) so they should be prepared to answer    TELEPHONE CALL NOTE  [name] has been deemed a candidate for a follow-up tele-health visit to limit community exposure during the Covid-19 pandemic.  I spoke with the patient via phone to ensure availability of phone/video source, confirm preferred email & phone number, and discuss instructions and expectations.  I reminded [name] to be prepared with any vital sign and/or heart rhythm information that could potentially be obtained via home monitoring, at the time of his visit. I reminded [name] to expect a phone call prior to his visit.  [name], CMA 10/19/2018 3:55 PM   INSTRUCTIONS FOR DOWNLOADING THE MYCHART APP TO SMARTPHONE  - The patient must first  make sure to have activated MyChart and know their login information - If Apple, go to [employer] and type in MyChart in the search bar and download the app. If Android, ask patient to go to [employer] and type in [place] in the search bar and download the app. The app is free but as with any other app downloads, their phone may require them to verify saved payment information or Apple/Android password.  - The patient will need to then log into the app with their MyChart username and password, and select [facility] as their healthcare provider to link the account. When it is time for your visit, go to the MyChart app, find appointments, and click Begin Video Visit. Be sure to Select Allow for your device to access the Microphone and Camera for your visit. You will then be connected, and your provider will be with you shortly.  **If they have any issues connecting, or need assistance please contact MyChart service desk [phone])**  **If using a computer, in order to ensure the best quality for their visit they will need to use either of the following Internet Browsers: [employer], or Google Chrome**  IF USING DOXIMITY or DOXY.ME - The patient will receive a link just prior to their visit by text.     FULL LENGTH CONSENT FOR TELE-HEALTH VISIT   I hereby voluntarily request, consent and authorize [place] and its employed or contracted physicians, physician assistants, nurse practitioners or other licensed health care professionals (the Practitioner), to provide me with telemedicine health care services (the "Services") as deemed necessary by the treating Practitioner. I acknowledge and consent to receive the Services by the Practitioner via telemedicine. I understand that the telemedicine visit will involve communicating with the Practitioner through live audiovisual communication technology and the disclosure of certain medical information by electronic transmission.  I acknowledge that I have been given the opportunity to request an in-person assessment or other available alternative prior to the telemedicine visit and am voluntarily participating in the telemedicine visit.  I understand that I have the right to withhold or withdraw my consent to the use of telemedicine in the course of my care at any time, without affecting my right to future care or treatment, and that the Practitioner or I may terminate the telemedicine visit at any time. I understand that I have the right to inspect all information obtained and/or recorded in the course of the telemedicine visit and may receive copies of available information for a reasonable fee.  I understand that some of the potential risks of receiving the Services via telemedicine include:  Marland Kitchen Delay or interruption in medical evaluation due to technological equipment failure or disruption; . Information transmitted may not be sufficient (e.g. poor resolution of images) to allow for appropriate medical decision making by the Practitioner; and/or  . In rare instances, security protocols could fail, causing a breach of personal health information.  Furthermore, I acknowledge  that it is my responsibility to provide information about my medical history, conditions and care that is complete and accurate to the best of my ability. I acknowledge that Practitioner's advice, recommendations, and/or decision may be based on factors not within their control, such as incomplete or inaccurate data provided by me or distortions of diagnostic images or specimens that may result from electronic transmissions. I understand that the practice of medicine is not an exact science and that Practitioner makes no warranties or guarantees regarding treatment outcomes. I acknowledge that I will receive a copy of this consent concurrently upon execution via email to the email address I last provided but may also request a printed copy by calling the office  of [place].    I understand that my insurance will be billed for this visit.   I have read or had this consent read to me. . I understand the contents of this consent, which adequately explains the benefits and risks of the Services being provided via telemedicine.  . I have been provided ample opportunity to ask questions regarding this consent and the Services and have had my questions answered to my satisfaction. . I give my informed consent for the services to be provided through the use of telemedicine in my medical care  By participating in this telemedicine visit I agree to the above.

## 2018-10-19 NOTE — Patient Instructions (Signed)
Medication Instructions:   Your physician recommends that you continue on your current medications as directed. Please refer to the Current Medication list given to you today.  If you need a refill on your cardiac medications before your next appointment, please call your pharmacy.   Lab work: NONE ORDERED  TODAY   If you have labs (blood work) drawn today and your tests are completely normal, you will receive your results only by: Marland Kitchen MyChart Message (if you have MyChart) OR . A paper copy in the mail If you have any lab test that is abnormal or we need to change your treatment, we will call you to review the results.  Testing/Procedures: NONE ORDERED  TODAY  Follow-Up: 3  months with Scott   Any Other Special Instructions Will Be Listed Below (If Applicable).

## 2018-10-19 NOTE — Progress Notes (Signed)
Virtual Visit via Telephone Note   This visit type was conducted due to national recommendations for restrictions regarding the COVID-19 Pandemic (e.g. social distancing) in an effort to limit this patient's exposure and mitigate transmission in our community.  Due to his co-morbid illnesses, this patient is at least at moderate risk for complications without adequate follow up.  This format is felt to be most appropriate for this patient at this time.  The patient did not have access to video technology/had technical difficulties with video requiring transitioning to audio format only (telephone).  All issues noted in this document were discussed and addressed.  No physical exam could be performed with this format.  Please refer to the patient's chart for his  consent to telehealth for [facility].   Date:  10/19/2018   ID:  [name], DOB 1934/11/09, MRN [id number]  Patient Location: Home Provider Location: Home  PCP:  Shirline Frees, MD  Cardiologist:  Mertie Moores, MD / [name], PA-C    Electrophysiologist:  None   Evaluation Performed:  Follow-Up Visit  Chief Complaint: Follow-up on syncope  History of Present Illness:    [name] is a 84 y.o. male with coronary artery diseases/p CABG in 2003, aortic insufficiency,combined systolic and diastolic heart failure,chronic kidney disease, HTN, HL, carotid artery disease.He has been nonadherent to follow-up and medications in the past. He has a history of elevated LFTs and total CK on statin therapy in the past.He has been managed with Ezetimibe and has declined referral to the [facility].  He had a NSTEMI in 9/16 andCardiac Catheterizationdemonstrated severe 3 vessel CAD with a chronically occluded LCx, RCA and severe subtotal occlusion of the native mid LAD, chronically occluded SVG-RCA and SVG-LAD with severe stenosis and SVG-OM. He underwent PCI with DES 2 in overlapping fashion to the native LAD as well as DES to  the SVG-OM.He was admitted in February 2019 for decompensated heart failure.    He was previously a patient of Dr. Percival Spanish but wanted to continue coming to our [facility] office.  He has mainly been followed by me along with Dr. Saunders Revel.  With Dr. Darnelle Bos move to [facility], I planned to have him continue with me and Dr. Acie Fredrickson (Dr. Acie Fredrickson has not met him yet).  I saw him last in December.  He was recently seen 10/05/2018 by Daune Perch, NP for syncope.  Upon review of his chart, his episodes of syncope happened while he was sitting down.  His family noted that his eyes rolled back in his head and the episode lasted for about a minute or 2.  He did not respond to verbal stimuli.  Orthostatic vital signs were normal in the office.  An echocardiogram was obtained and demonstrated an EF of 35-40% with moderate diastolic dysfunction, moderate aortic insufficiency and mild to moderate aortic stenosis.  Aortic stenosis was felt to be severe by AVA but mild by mean gradient.  Low flow gradient aortic stenosis could not be ruled out.  The ascending aorta was dilated at 43 mm.  Of note, LVOT/AV ratio is >0.25.  Carotid Dopplers demonstrated mild bilateral plaque.  Event monitor is currently pending.  He has been referred to neurology.  Of note, his BNP was significantly elevated and his Lasix was adjusted for a few days.  Today, he notes that he is doing well.  His family helps with his history as well.  His weight is down 10 pounds.  He notes that his legs are  less swollen.  He feels his breathing may be a little bit better.  He has not had any chest pain, orthopnea, paroxysmal nocturnal dyspnea.  He has not had any further syncope.  His wife tells me that he got a new formulation of carvedilol before the symptoms started.  Since his last episode of syncope in May, she has not given him any more carvedilol.  He has not had any recurrent episodes.  The patient does not have symptoms concerning for COVID-19 infection  (fever, chills, cough, or new shortness of breath).   Past Medical History:  Diagnosis Date   CAD (coronary artery disease)    a. CABG (2003)  b. 9/17/6: NSTEMI s/p overlapping DES x2 to LAD and DESx1 to SVG-->OM    Carotid artery disease ([place])    a. 09/2013: carotid dopplers with stable 1-39% ICA stenosis bilaterally  //  b. Carotid US 6/50: RICA 35-46%; LICA 5-68% >> FU 1 year // c. Carotid US 09/2016: Bilateral ICA 1-39 >> follow-up 1 year  //  d. Carotid US 5/19: Bilateral ICA 1-39; decreased flow in right subclavian artery    CHF (congestive heart failure) (HCC)    Dementia (HCC)    History of echocardiogram    Echo 4/18: mild conc LVH, EF 35-40, diff HK, anteroseptal and inf-septal HK, Gr 2 DD, mild AS (mean 13), mod AI, mildly dilated asc Ao (43 mm), mod MAC, mod MR, severe LAE, mild TR, mod PI, PASP 43   Hyperlipidemia    Hypertension    Internal hemorrhoids    Ischemic cardiomyopathy    a. 2D ECHO 01/28/15 w/ EF 25-30%, severe HK of the basal-midinferoseptal myocardium, basal-midinferior myocardium and midanteroseptal myocardium. Akinesis of apical inferior and apicalanterior myocardium. G2DD. Mild AS, Asc aorta diameter 41m. Mild dilation of aortic root. Mild MR, mild PR and PA peak pressure 36 mmg HG.   Peptic ulcer disease    Prostate CA ([place]    a. s/p seed implants   Past Surgical History:  Procedure Laterality Date   CARDIAC CATHETERIZATION N/A 01/27/2015   Procedure: Left Heart Cath and Coronary Angiography;  Surgeon: MWellington Hampshire MD;  [facility];  Service: Cardiovascular;  Laterality: N/A;   CARDIAC SURGERY  2003   SVG LAD, SVG to right coronary artery, SVG to marginal     Current Meds  Medication Sig   aspirin EC 81 MG tablet Take 1 tablet (81 mg total) by mouth daily.   ezetimibe (ZETIA) 10 MG tablet Take 1 tablet (10 mg total) by mouth daily.   furosemide (LASIX) 40 MG tablet Take 1 tablet (40 mg total) by mouth daily.    isosorbide mononitrate (IMDUR) 30 MG 24 hr tablet Take 1 tablet (30 mg total) by mouth daily.   loratadine (CLARITIN) 10 MG tablet Take 10 mg by mouth daily.   nitroGLYCERIN (NITROSTAT) 0.4 MG SL tablet place 1 tablet under the tongue if needed every 5 minutes for chest pain  UP TO 3 DOSES     Allergies:   Ramipril; Statins; and Latex   Social History   Tobacco Use   Smoking status: Former Smoker   Smokeless tobacco: Never Used  Substance Use Topics   Alcohol use: No   Drug use: No     Family Hx: The patient's family history includes Stroke in his father.  ROS:   Please see the history of present illness.     All other systems reviewed and are negative.  Prior CV studies:   The following studies were reviewed today:  Carotid US 10/18/2018 Bilateral ICA 1-39  Repeat in 1 year  Echo 10/11/2018 EF 35-40, Gr 2 DD, ant mod HK, ant-sept + inf-sept severe HK, mild RAE, MAC, mild to mod MR, mod AoV calc, mod AI, mild to mod AS (AVA 0.97; mean 12 - cannot rule out low flow gradient AS), mod PR, mod dilation of asc Aorta (43 mm)  Carotid US 09/10/2017 Bilateral ICA 1-39; repeat 12 months   Echo 08/20/16 Mild conc LVH, EF 35-40, ant-sept and inf-sept HK, Gr 2 DD, mild AS (mean 13), mod AI, mild dilated asc aorta (43 mm), MAC, mod MR, severe LAE, mild TR, mod PI, PASP 43   Carotid US 9/38:  RICA 18-29%; LICA 9-37% >> FU 1 year   Echo 05/31/15 Mild LVH, EF 40-45%, inf-septal HK, apical inf-septal AK, Gr 1 DD, mild AI, Aortic Sclerosis without stenosis (mean 15 mmHg), mod MAC, mild to mod reduced RVSF   Echo 01/28/15 Inferoseptal HK, inferior HK, apical AK, anteroseptal HK, moderate LVH, EF 16-96% grade 2 diastolic dysfunction, mild aortic stenosis (mean 9 mmHg), moderate AI, mildly dilated aortic root, ascending aortic diameter 40 mm (mildly dilated), mild MR, mild LAE, mild PI, PASP 36 mmHg   LHC 01/27/15 LM: 40% LAD: Mid 99%, 99% >> PCI: Synergy DES x 2 (overlapping) LCx:  100% RCA: 100% SVG-LAD: 100% SVG-OM2 90% >> PCI: Synergy DES SVG-RCA 100% EF 25-35%, 4+ MR   Abd Aorta US 09/19/13 No AAA   Carotid US 09/19/13 Bilateral ICA 1-39% FU 2 years (due in 09/2015)  Labs/Other Tests and Data Reviewed:    EKG:  An ECG dated 10/05/2018 was personally reviewed today and demonstrated:  Sinus bradycardia, heart rate 53, left axis deviation, LVH, QTC 454  Recent Labs: 12/21/2017: ALT 14 10/05/2018: Hemoglobin 11.8; NT-Pro BNP 12,132; Platelets 197; TSH 5.520 10/11/2018: BUN 43; Creatinine, Ser 1.74; Potassium 3.6; Sodium 140   Recent Lipid Panel Lab Results  Component Value Date/Time   CHOL 177 12/21/2017 08:07 AM   TRIG 63 12/21/2017 08:07 AM   HDL 62 12/21/2017 08:07 AM   CHOLHDL 2.9 12/21/2017 08:07 AM   CHOLHDL 6.8 (H) 07/27/2015 08:45 AM   LDLCALC 102 (H) 12/21/2017 08:07 AM   LDLDIRECT 126 (H) 09/07/2017 10:18 AM    Wt Readings from Last 3 Encounters:  10/19/18 135 lb (61.2 kg)  10/05/18 145 lb 12.8 oz (66.1 kg)  04/19/18 147 lb 12.8 oz (67 kg)     Objective:    Vital Signs:  BP 126/69    Pulse 71    Ht _0  (1.676 m)    Wt 135 lb (61.2 kg)    BMI 21.79 kg/m    VITAL SIGNS:  reviewed GEN:  no acute distress RESPIRATORY:  No labored breathing NEURO:  Alert and oriented PSYCH:  His mood seems good  ASSESSMENT & PLAN:     Syncope, unspecified syncope type Unexplained.  I have advised that he do no driving for the next 6 months.  He does have an appointment with neurology for evaluation in July (Dr. Jannifer Franklin).  Of note, his heart rate was in the low 50s when he was seen in the office.  At that time, he was no longer on carvedilol.  His wife is concerned that the carvedilol was contributing to his symptoms.  It is possible that he had symptomatic bradycardia that led to his symptoms.  He still has not started  wearing his event monitor.  I have advised him and his wife to remain off of carvedilol.  If he has any pauses or significant bradycardia on  his event monitor, I will refer him to electrophysiology.  Coronary artery disease of native artery of native heart with stable angina pectoris ([place]) History of CABG.  He suffered a non-ST elevation myocardial infarction in September 2016 treated with drug-eluting stent x2 to the native LAD and drug-eluting stent to the vein graft to the obtuse marginal.  He has a chronically occluded RCA and an occluded SVG-RCA.    He denies anginal symptoms.  Continue aspirin, ezetimibe, isosorbide.  Chronic combined systolic and diastolic heart failure (HCC) Recent echocardiogram with EF 35-40.  This is consistent with a previous echocardiogram in 2018.  Low blood pressure is limited titration of his medications in the past.  He is now off of beta-blocker therapy due to concerns over symptomatic bradycardia.  Continue nitrates.  Valvular heart disease Echocardiogram 2018 demonstrated mild aortic stenosis with a mean gradient of 13.  Recent echocardiogram demonstrated mild to moderate aortic stenosis with a mean of 12.  Low flow gradient aortic stenosis cannot be ruled out.  He has moderate aortic insufficiency, mild to moderate mitral dilatation and moderate pulmonic insufficiency.  LVOT/AV ratio is 0.32 on the current study.  There is a similar ratio on the study in 2018.  At this point, I do not think his aortic stenosis is contributing to his current symptoms.  He may be a candidate for TAVR but is certainly not a candidate for surgical AVR.  Consider repeating his echo in 6 to 12 months.  Bilateral carotid artery disease, unspecified type ([place]) Recent carotid US with mild bilateral disease.  Continue current therapy.  Repeat carotid Dopplers in 1 year.  Hyperlipidemia, unspecified hyperlipidemia type Intolerant of statins.  Continue ezetimibe.  Essential hypertension The patient's blood pressure is controlled on his current regimen.  Continue current therapy.   Chronic kidney disease, stage 3 (HCC) Recent  creatinine stable.  COVID-19 Education: The signs and symptoms of COVID-19 were discussed with the patient and how to seek care for testing (follow up with PCP or arrange E-visit).  The importance of social distancing was discussed today.  Time:   Today, I have spent 22 minutes with the patient with telehealth technology discussing the above problems.     Medication Adjustments/Labs and Tests Ordered: Current medicines are reviewed at length with the patient today.  Concerns regarding medicines are outlined above.   Tests Ordered: No orders of the defined types were placed in this encounter.   Medication Changes: No orders of the defined types were placed in this encounter.   Disposition:  Follow up in 3 month(s) with [name], PA-C   Signed, [name], PA-C  10/19/2018 4:40 PM    [facility]

## 2018-10-19 NOTE — Telephone Encounter (Signed)
Lvm to call back and schedule follow up

## 2018-10-20 NOTE — Telephone Encounter (Signed)
Lvm to call back and schedule follow up

## 2018-10-26 ENCOUNTER — Other Ambulatory Visit: Payer: Self-pay | Admitting: Physician Assistant

## 2018-10-26 MED ORDER — FUROSEMIDE 40 MG PO TABS: 40.0000 mg | ORAL_TABLET | Freq: Every day | ORAL | 3 refills | Status: DC

## 2018-10-26 MED ORDER — EZETIMIBE 10 MG PO TABS: 10.0000 mg | ORAL_TABLET | Freq: Every day | ORAL | 3 refills | Status: AC

## 2018-10-27 ENCOUNTER — Ambulatory Visit (INDEPENDENT_AMBULATORY_CARE_PROVIDER_SITE_OTHER): Payer: Medicare Other

## 2018-10-27 DIAGNOSIS — R55 Syncope and collapse: Secondary | ICD-10-CM

## 2018-11-09 ENCOUNTER — Emergency Department (HOSPITAL_COMMUNITY): Payer: Medicare Other

## 2018-11-09 ENCOUNTER — Encounter (HOSPITAL_COMMUNITY): Payer: Self-pay

## 2018-11-09 ENCOUNTER — Telehealth: Payer: Self-pay | Admitting: Physician Assistant

## 2018-11-09 ENCOUNTER — Inpatient Hospital Stay (HOSPITAL_COMMUNITY)
Admission: EM | Admit: 2018-11-09 | Discharge: 2018-11-11 | DRG: 280 | Disposition: A | Discharge status: Home / Self Care | Payer: Medicare Other | Attending: Internal Medicine | Admitting: Internal Medicine

## 2018-11-09 ENCOUNTER — Other Ambulatory Visit: Payer: Self-pay

## 2018-11-09 DIAGNOSIS — F0390 Unspecified dementia without behavioral disturbance: Secondary | ICD-10-CM | POA: Diagnosis present

## 2018-11-09 DIAGNOSIS — I509 Heart failure, unspecified: Secondary | ICD-10-CM

## 2018-11-09 DIAGNOSIS — I340 Nonrheumatic mitral (valve) insufficiency: Secondary | ICD-10-CM | POA: Diagnosis present

## 2018-11-09 DIAGNOSIS — Z888 Allergy status to other drugs, medicaments and biological substances status: Secondary | ICD-10-CM | POA: Diagnosis not present

## 2018-11-09 DIAGNOSIS — E785 Hyperlipidemia, unspecified: Secondary | ICD-10-CM | POA: Diagnosis present

## 2018-11-09 DIAGNOSIS — N184 Chronic kidney disease, stage 4 (severe): Secondary | ICD-10-CM

## 2018-11-09 DIAGNOSIS — Z87891 Personal history of nicotine dependence: Secondary | ICD-10-CM | POA: Diagnosis not present

## 2018-11-09 DIAGNOSIS — I214 Non-ST elevation (NSTEMI) myocardial infarction: Secondary | ICD-10-CM | POA: Diagnosis present

## 2018-11-09 DIAGNOSIS — K648 Other hemorrhoids: Secondary | ICD-10-CM | POA: Diagnosis present

## 2018-11-09 DIAGNOSIS — E782 Mixed hyperlipidemia: Secondary | ICD-10-CM

## 2018-11-09 DIAGNOSIS — Z8711 Personal history of peptic ulcer disease: Secondary | ICD-10-CM | POA: Diagnosis not present

## 2018-11-09 DIAGNOSIS — Z789 Other specified health status: Secondary | ICD-10-CM

## 2018-11-09 DIAGNOSIS — Z9114 Patient's other noncompliance with medication regimen: Secondary | ICD-10-CM | POA: Diagnosis not present

## 2018-11-09 DIAGNOSIS — I5043 Acute on chronic combined systolic (congestive) and diastolic (congestive) heart failure: Secondary | ICD-10-CM | POA: Diagnosis present

## 2018-11-09 DIAGNOSIS — E875 Hyperkalemia: Secondary | ICD-10-CM | POA: Diagnosis present

## 2018-11-09 DIAGNOSIS — Z823 Family history of stroke: Secondary | ICD-10-CM

## 2018-11-09 DIAGNOSIS — N183 Chronic kidney disease, stage 3 unspecified: Secondary | ICD-10-CM | POA: Diagnosis present

## 2018-11-09 DIAGNOSIS — I10 Essential (primary) hypertension: Secondary | ICD-10-CM | POA: Diagnosis not present

## 2018-11-09 DIAGNOSIS — I130 Hypertensive heart and chronic kidney disease with heart failure and stage 1 through stage 4 chronic kidney disease, or unspecified chronic kidney disease: Secondary | ICD-10-CM | POA: Diagnosis present

## 2018-11-09 DIAGNOSIS — N171 Acute kidney failure with acute cortical necrosis: Secondary | ICD-10-CM

## 2018-11-09 DIAGNOSIS — I255 Ischemic cardiomyopathy: Secondary | ICD-10-CM | POA: Diagnosis present

## 2018-11-09 DIAGNOSIS — I2510 Atherosclerotic heart disease of native coronary artery without angina pectoris: Secondary | ICD-10-CM | POA: Diagnosis present

## 2018-11-09 DIAGNOSIS — R400 Somnolence: Secondary | ICD-10-CM | POA: Diagnosis not present

## 2018-11-09 DIAGNOSIS — Z7982 Long term (current) use of aspirin: Secondary | ICD-10-CM

## 2018-11-09 DIAGNOSIS — Z1159 Encounter for screening for other viral diseases: Secondary | ICD-10-CM | POA: Diagnosis not present

## 2018-11-09 DIAGNOSIS — I252 Old myocardial infarction: Secondary | ICD-10-CM

## 2018-11-09 DIAGNOSIS — Z951 Presence of aortocoronary bypass graft: Secondary | ICD-10-CM

## 2018-11-09 DIAGNOSIS — Z8546 Personal history of malignant neoplasm of prostate: Secondary | ICD-10-CM

## 2018-11-09 HISTORY — DX: Chronic kidney disease, stage 3 unspecified: N18.30

## 2018-11-09 LAB — BASIC METABOLIC PANEL
Anion gap: 9 (ref 5–15)
Anion gap: 11 (ref 5–15)
BUN: 33 mg/dL — ABNORMAL HIGH (ref 8–23)
BUN: 33 mg/dL — ABNORMAL HIGH (ref 8–23)
CO2: 23 mmol/L (ref 22–32)
CO2: 22 mmol/L (ref 22–32)
Calcium: 8.7 mg/dL — ABNORMAL LOW (ref 8.9–10.3)
Calcium: 8.7 mg/dL — ABNORMAL LOW (ref 8.9–10.3)
Chloride: 103 mmol/L (ref 98–111)
Chloride: 103 mmol/L (ref 98–111)
Creatinine, Ser: 1.81 mg/dL — ABNORMAL HIGH (ref 0.61–1.24)
Creatinine, Ser: 1.8 mg/dL — ABNORMAL HIGH (ref 0.61–1.24)
GFR calc Af Amer: 39 mL/min — ABNORMAL LOW (ref >60)
GFR calc Af Amer: 40 mL/min — ABNORMAL LOW (ref >60)
GFR calc non Af Amer: 34 mL/min — ABNORMAL LOW (ref >60)
GFR calc non Af Amer: 34 mL/min — ABNORMAL LOW (ref >60)
Glucose, Bld: 108 mg/dL — ABNORMAL HIGH (ref 70–99)
Glucose, Bld: 101 mg/dL — ABNORMAL HIGH (ref 70–99)
Potassium: 6.4 mmol/L (ref 3.5–5.1)
Potassium: 5.3 mmol/L — ABNORMAL HIGH (ref 3.5–5.1)
Sodium: 135 mmol/L (ref 135–145)
Sodium: 136 mmol/L (ref 135–145)

## 2018-11-09 LAB — BRAIN NATRIURETIC PEPTIDE: B Natriuretic Peptide: 1709.4 pg/mL — ABNORMAL HIGH (ref 0.0–100.0)

## 2018-11-09 LAB — SARS CORONAVIRUS 2 BY RT PCR (HOSPITAL ORDER, PERFORMED IN ~~LOC~~ HOSPITAL LAB): SARS Coronavirus 2: NEGATIVE

## 2018-11-09 LAB — CBC
HCT: 38.4 % — ABNORMAL LOW (ref 39.0–52.0)
Hemoglobin: 12.5 g/dL — ABNORMAL LOW (ref 13.0–17.0)
MCH: 28.3 pg (ref 26.0–34.0)
MCHC: 32.6 g/dL (ref 30.0–36.0)
MCV: 87.1 fL (ref 80.0–100.0)
Platelets: 183 10*3/uL (ref 150–400)
RBC: 4.41 MIL/uL (ref 4.22–5.81)
RDW: 15 % (ref 11.5–15.5)
WBC: 6.2 10*3/uL (ref 4.0–10.5)
nRBC: 0 % (ref 0.0–0.2)

## 2018-11-09 LAB — TROPONIN I (HIGH SENSITIVITY)
Troponin I (High Sensitivity): 4554 ng/L (ref <18)
Troponin I (High Sensitivity): 4359 ng/L (ref <18)

## 2018-11-09 LAB — PROTIME-INR
INR: 1.2 (ref 0.8–1.2)
Prothrombin Time: 15.1 seconds (ref 11.4–15.2)

## 2018-11-09 MED ORDER — SODIUM CHLORIDE 0.9% FLUSH: 3.0000 mL | Freq: Once | INTRAVENOUS | Status: DC

## 2018-11-09 MED ORDER — ACETAMINOPHEN 325 MG PO TABS: 650.0000 mg | ORAL_TABLET | ORAL | Status: DC | PRN

## 2018-11-09 MED ORDER — HEPARIN (PORCINE) 25000 UT/250ML-% IV SOLN
850.0000 [IU]/h | INTRAVENOUS | Status: DC
  Administered 2018-11-09: 20:00:00 750 [IU]/h via INTRAVENOUS
  Filled 2018-11-09: qty 250

## 2018-11-09 MED ORDER — NITROGLYCERIN 0.4 MG SL SUBL: 0.4000 mg | SUBLINGUAL_TABLET | SUBLINGUAL | Status: DC | PRN

## 2018-11-09 MED ORDER — ASPIRIN EC 81 MG PO TBEC
81.0000 mg | DELAYED_RELEASE_TABLET | Freq: Every day | ORAL | Status: DC
  Administered 2018-11-10 – 2018-11-11 (×2): 81 mg via ORAL
  Filled 2018-11-09 (×2): qty 1

## 2018-11-09 MED ORDER — FUROSEMIDE 10 MG/ML IJ SOLN
40.0000 mg | Freq: Two times a day (BID) | INTRAMUSCULAR | Status: DC
  Administered 2018-11-09 – 2018-11-11 (×4): 40 mg via INTRAVENOUS
  Filled 2018-11-09 (×4): qty 4

## 2018-11-09 MED ORDER — SODIUM CHLORIDE 0.9% FLUSH: 3.0000 mL | INTRAVENOUS | Status: DC | PRN

## 2018-11-09 MED ORDER — HEPARIN BOLUS VIA INFUSION
3500.0000 [IU] | Freq: Once | INTRAVENOUS | Status: AC
  Administered 2018-11-09: 20:00:00 3500 [IU] via INTRAVENOUS
  Filled 2018-11-09: qty 3500

## 2018-11-09 MED ORDER — ONDANSETRON HCL 4 MG/2ML IJ SOLN: 4.0000 mg | Freq: Four times a day (QID) | INTRAMUSCULAR | Status: DC | PRN

## 2018-11-09 MED ORDER — SODIUM CHLORIDE 0.9% FLUSH
3.0000 mL | Freq: Two times a day (BID) | INTRAVENOUS | Status: DC
  Administered 2018-11-09 – 2018-11-11 (×3): 3 mL via INTRAVENOUS

## 2018-11-09 MED ORDER — ISOSORBIDE MONONITRATE ER 30 MG PO TB24
30.0000 mg | ORAL_TABLET | Freq: Every day | ORAL | Status: DC
  Administered 2018-11-10 – 2018-11-11 (×2): 30 mg via ORAL
  Filled 2018-11-09 (×2): qty 1

## 2018-11-09 MED ORDER — SODIUM CHLORIDE 0.9 % IV SOLN: 250.0000 mL | INTRAVENOUS | Status: DC | PRN

## 2018-11-09 MED ORDER — EZETIMIBE 10 MG PO TABS
10.0000 mg | ORAL_TABLET | Freq: Every day | ORAL | Status: DC
  Administered 2018-11-10 – 2018-11-11 (×2): 10 mg via ORAL
  Filled 2018-11-09 (×2): qty 1

## 2018-11-09 NOTE — ED Triage Notes (Signed)
Pt BIB [facility] for eval of CP x 3 weeks, worse today, not associated w/ trauma. Reports worse w/ exertion. Went to [facility] this afternoon, sent here for ? New 1st degree block w/ non specific EKG changes. Rec'd [id number] ASA, 1 SL NTG w/ improvement of CP.

## 2018-11-09 NOTE — H&P (Signed)
History and Physical    [name] BOF:[id number] DOB: 1934/12/05 DOA: 11/09/2018  PCP: [facility] [place]   Patient coming from: Home   Chief Complaint: Chest pain   HPI: [name] is a 84 y.o. male with medical history significant for chronic kidney disease stage III, dementia, coronary artery disease, hypertension, and chronic combined systolic and diastolic CHF, now presenting to the emergency department for evaluation of chest pain.  Patient has a documented history of dementia and has difficulty providing a reliable history.  He acknowledges having chest pain earlier today that resolved after he was given nitroglycerin at an urgent care.  He reports that the pain has been intermittent "for some time now," but has difficulty providing any further specificity.  He had reported worsening with exertion earlier in the ED, but is unable to identify any exacerbating factors at this time.  He denies fevers, chills, or cough.  He denies shortness of breath though appears to be dyspneic.  Denies lower extremity swelling or tenderness.  ED Course: Upon arrival to the ED, patient is found to be afebrile, saturating mid 80s-90s on room air, and with stable blood pressure.  EKG features a sinus rhythm with first-degree AV nodal block, incomplete LBBB, and LVH with repolarization abnormality.  Chest x-ray is notable for stable cardiomegaly with increased pulmonary vascularity and trace effusions suggestive of mild or developing edema.  Chemistry panel features a potassium of 6.3, BUN 33, and creatinine 1.80, slightly up from his apparent baseline of 1.7.  CBC is unremarkable.  Troponin is elevated 4554 and BNP elevated at 1709.  Cardiology was consulted by the ED physician, heparin infusion and IV Lasix was started, and hospitalist admission was recommended.  Review of Systems:  All other systems reviewed and apart from HPI, are negative.  Past Medical History:  Diagnosis Date    CAD (coronary artery disease)    a. CABG (2003)  b. 9/17/6: NSTEMI s/p overlapping DES x2 to LAD and DESx1 to SVG-->OM    Carotid artery disease ([place])    a. 09/2013: carotid dopplers with stable 1-39% ICA stenosis bilaterally  //  b. Carotid US 7/78: RICA 24-23%; LICA 5-36% >> FU 1 year // c. Carotid US 09/2016: Bilateral ICA 1-39 >> follow-up 1 year  //  d. Carotid US 5/19: Bilateral ICA 1-39; decreased flow in right subclavian artery    CHF (congestive heart failure) (HCC)    Dementia (HCC)    History of echocardiogram    Echo 4/18: mild conc LVH, EF 35-40, diff HK, anteroseptal and inf-septal HK, Gr 2 DD, mild AS (mean 13), mod AI, mildly dilated asc Ao (43 mm), mod MAC, mod MR, severe LAE, mild TR, mod PI, PASP 43   Hyperlipidemia    Hypertension    Internal hemorrhoids    Ischemic cardiomyopathy    a. 2D ECHO 01/28/15 w/ EF 25-30%, severe HK of the basal-midinferoseptal myocardium, basal-midinferior myocardium and midanteroseptal myocardium. Akinesis of apical inferior and apicalanterior myocardium. G2DD. Mild AS, Asc aorta diameter 57m. Mild dilation of aortic root. Mild MR, mild PR and PA peak pressure 36 mmg HG.   Peptic ulcer disease    [place]state [place] ([place]    a. s/p seed implants    Past Surgical History:  Procedure Laterality Date   CARDIAC CATHETERIZATION N/A 01/27/2015   Procedure: Left Heart Cath and Coronary Angiography;  Surgeon: MWellington Hampshire MD;  [facility];  Service: Cardiovascular;  Laterality: N/A;  CARDIAC SURGERY  2003   SVG LAD, SVG to right coronary artery, SVG to marginal     reports that he has quit smoking. He has never used smokeless tobacco. He reports that he does not drink alcohol or use drugs.  Allergies  Allergen Reactions   Ramipril Swelling and Other (See Comments)    Angioedema    Statins Other (See Comments)    Elevated LFTs with statins in the past   Latex Itching and Rash    Family History  Problem Relation  Age of Onset   Stroke Father      Prior to Admission medications   Medication Sig Start Date End Date Taking? Authorizing Provider  aspirin EC 81 MG tablet Take 1 tablet (81 mg total) by mouth daily. 08/03/17   End, Harrell Gave, MD  ezetimibe (ZETIA) 10 MG tablet Take 1 tablet (10 mg total) by mouth daily. 10/26/18   [name] T, PA-C  furosemide (LASIX) 40 MG tablet Take 1 tablet (40 mg total) by mouth daily. 10/26/18   [name] T, PA-C  isosorbide mononitrate (IMDUR) 30 MG 24 hr tablet Take 1 tablet (30 mg total) by mouth daily. 09/29/18   [name] T, PA-C  loratadine (CLARITIN) 10 MG tablet Take 10 mg by mouth daily.    [provider]  nitroGLYCERIN (NITROSTAT) 0.4 MG SL tablet place 1 tablet under the tongue if needed every 5 minutes for chest pain  UP TO 3 DOSES 04/19/18   [name] T, PA-C    Physical Exam: Vitals:   11/09/18 1930 11/09/18 1945 11/09/18 2000 11/09/18 2015  BP: 132/80 (!) 151/100 131/85 (!) 149/90  Pulse: 84 (!) 102 85 98  Resp: 18 14 (!) 26 (!) 25  Temp:      TempSrc:      SpO2: 100% 100% 100% 100%  Weight:      Height:        Constitutional: NAD, calm, frail  Eyes: PERTLA, lids and conjunctivae normal ENMT: Mucous membranes are moist. Posterior pharynx clear of any exudate or lesions.   Neck: normal, supple, no masses, no thyromegaly Respiratory: mild tachypnea and dyspnea with speech. No pallor or cyanosis.  Cardiovascular: S1 & S2 heard, regular rate and rhythm. Trace pretibial edema bilaterally. Abdomen: No distension, no tenderness, soft. Bowel sounds normal.  Musculoskeletal: no clubbing / cyanosis. No joint deformity upper and lower extremities.   Skin: no significant rashes, lesions, ulcers. Warm, dry, well-perfused. Neurologic: No gross facial asymmetry. Sensation intact. Moving all extremities.  Psychiatric: Alert and oriented to person and place only. Pleasant, cooperative.    Labs on Admission: I have personally  reviewed following labs and imaging studies  CBC: Recent Labs  Lab 11/09/18 1654  WBC 6.2  HGB 12.5*  HCT 38.4*  MCV 87.1  PLT 737   Basic Metabolic Panel: Recent Labs  Lab 11/09/18 1654 11/09/18 1804  NA 135 136  K 6.4* 5.3*  CL 103 103  CO2 23 22  GLUCOSE 108* 101*  BUN 33* 33*  CREATININE 1.81* 1.80*  CALCIUM 8.7* 8.7*   GFR: Estimated Creatinine Clearance: 27.3 mL/min (A) (by C-G formula based on SCr of 1.8 mg/dL (H)). Liver Function Tests: No results for input(s): AST, ALT, ALKPHOS, BILITOT, PROT, ALBUMIN in the last 168 hours. No results for input(s): LIPASE, AMYLASE in the last 168 hours. No results for input(s): AMMONIA in the last 168 hours. Coagulation Profile: Recent Labs  Lab 11/09/18 1654  INR 1.2  Cardiac Enzymes: No results for input(s): CKTOTAL, CKMB, CKMBINDEX, TROPONINI in the last 168 hours. BNP (last 3 results) Recent Labs    10/05/18 1555  PROBNP 12,132*   HbA1C: No results for input(s): HGBA1C in the last 72 hours. CBG: No results for input(s): GLUCAP in the last 168 hours. Lipid Profile: No results for input(s): CHOL, HDL, LDLCALC, TRIG, CHOLHDL, LDLDIRECT in the last 72 hours. Thyroid Function Tests: No results for input(s): TSH, T4TOTAL, FREET4, T3FREE, THYROIDAB in the last 72 hours. Anemia Panel: No results for input(s): VITAMINB12, FOLATE, FERRITIN, TIBC, IRON, RETICCTPCT in the last 72 hours. Urine analysis:    Component Value Date/Time   COLORURINE YELLOW 01/27/2015 1007   APPEARANCEUR CLOUDY (A) 01/27/2015 1007   LABSPEC 1.023 01/27/2015 1007   PHURINE 5.5 01/27/2015 1007   GLUCOSEU NEGATIVE 01/27/2015 1007   HGBUR MODERATE (A) 01/27/2015 1007   BILIRUBINUR NEGATIVE 01/27/2015 1007   KETONESUR 40 (A) 01/27/2015 1007   PROTEINUR 30 (A) 01/27/2015 1007   UROBILINOGEN 0.2 01/27/2015 1007   NITRITE NEGATIVE 01/27/2015 1007   LEUKOCYTESUR NEGATIVE 01/27/2015 1007   Sepsis  Labs: _0 (procalcitonin:4,lacticidven:4) ) Recent Results (from the past 240 hour(s))  SARS Coronavirus 2 (CEPHEID - Performed in [place] hospital lab), Hosp Order     Status: None   Collection Time: 11/09/18  6:32 PM   Specimen: Nasopharyngeal Swab  Result Value Ref Range Status   SARS Coronavirus 2 NEGATIVE NEGATIVE Final    Comment: (NOTE) If result is NEGATIVE SARS-CoV-2 target nucleic acids are NOT DETECTED. The SARS-CoV-2 RNA is generally detectable in upper and lower  respiratory specimens during the acute phase of infection. The lowest  concentration of SARS-CoV-2 viral copies this assay can detect is 250  copies / mL. A negative result does not preclude SARS-CoV-2 infection  and should not be used as the sole basis for treatment or other  patient management decisions.  A negative result may occur with  improper specimen collection / handling, submission of specimen other  than nasopharyngeal swab, presence of viral mutation(s) within the  areas targeted by this assay, and inadequate number of viral copies  (<250 copies / mL). A negative result must be combined with clinical  observations, patient history, and epidemiological information. If result is POSITIVE SARS-CoV-2 target nucleic acids are DETECTED. The SARS-CoV-2 RNA is generally detectable in upper and lower  respiratory specimens dur ing the acute phase of infection.  Positive  results are indicative of active infection with SARS-CoV-2.  Clinical  correlation with patient history and other diagnostic information is  necessary to determine patient infection status.  Positive results do  not rule out bacterial infection or co-infection with other viruses. If result is PRESUMPTIVE POSTIVE SARS-CoV-2 nucleic acids MAY BE PRESENT.   A presumptive positive result was obtained on the submitted specimen  and confirmed on repeat testing.  While 2019 novel coronavirus  (SARS-CoV-2) nucleic acids may be present in  the submitted sample  additional confirmatory testing may be necessary for epidemiological  and / or clinical management purposes  to differentiate between  SARS-CoV-2 and other Sarbecovirus currently known to infect humans.  If clinically indicated additional testing with an alternate test  methodology [phone]) is advised. The SARS-CoV-2 RNA is generally  detectable in upper and lower respiratory sp ecimens during the acute  phase of infection. The expected result is Negative. Fact Sheet for Patients:  StrictlyIdeas.no Fact Sheet for Healthcare Providers: BankingDealers.co.za This test is not yet approved or cleared by  the [employer] and has been authorized for detection and/or diagnosis of SARS-CoV-2 by FDA under an Emergency Use Authorization (EUA).  This EUA will remain in effect (meaning this test can be used) for the duration of the COVID-19 declaration under Section 564(b)(1) of the Act, 21 U.S.C. section 360bbb-3(b)(1), unless the authorization is terminated or revoked sooner. Performed at [facility], [place] [address], [place], [place] [address]      Radiological Exams on Admission: Dg Chest 2 View  Result Date: 11/09/2018 CLINICAL DATA:  84 year old male with chest pain and shortness of breath for 1 week. EXAM: CHEST - 2 VIEW COMPARISON:  07/05/2018 and earlier. FINDINGS: Upright AP and lateral views. Stable cardiomegaly and mediastinal contours. Prior CABG. Stable tortuosity of the thoracic aorta. Other mediastinal contours are within normal limits. Visualized tracheal air column is within normal limits. Mildly lower lung volumes. Trace fluid in the major fissures today, although no layering pleural effusion is evident. No pneumothorax or consolidation. Pulmonary vascularity is increased. No acute osseous abnormality identified. Negative visible bowel gas pattern. IMPRESSION: 1. Mildly lower lung volumes with increased  pulmonary vascularity and trace pleural fluid. Consider mild or developing edema. 2. Stable cardiomegaly. Electronically Signed   By: Genevie Ann M.D.   On: 11/09/2018 17:39    EKG: Independently reviewed. Sinus rhythm, 1st AV block, incomplete LBBB, repolarization abnormality.   Assessment/Plan   1. NSTEMI  - Presents with chest pain, found to have elevated troponin  - Patient was treated with ASA and NTG x1 prior to arrival in ED and pain has now resolved  - Cardiology consulting and has started IV heparin infusion, repeat troponin measurements, limited echo, considering cath    2. Acute on chronic combined systolic & diastolic CHF  - EF 70-96% on echo from 10/11/18  - Started on Lasix 40 mg IV q12h by cardiology, follow I/O's, monitor renal function and electrolytes   3. Hypertension  - BP a little high in ED, anticipate improvement with diuresis   4. Hyperkalemia; CKD stage III  - Patient has hx of CKD III with baseline SCr ~1.7  - SCr is 1.80 on admission with serum potassium of 6.4  - Potassium improved to 5.3 on repeat without any intervention  - He is being diuresed as above  - Check potassium level overnight and repeat chemistries in am, continue Lasix and cardiac monitoring    5. Dementia  - Continue supportive care     PPE: Mask, face shield. Patient wearing mask.  DVT prophylaxis: IV heparin  Code Status:  Full  Family Communication: Discussed with patient  Consults called: Cardiology  Admission status: Inpatient    Vianne Bulls, MD [facility] Pager [phone]  If 7PM-7AM, please contact night-coverage [email or web address] Password [facility]  11/09/2018, 8:32 PM

## 2018-11-09 NOTE — Telephone Encounter (Signed)
Ms. Handy called because she was concerned about her husband.  She was very concerned because she could not be with him in the hospital.  She stated that they are together all the time.  She is concerned that he will not get good care.  She is concerned that he will not get the right medications.  She is concerned that bad things will happen to him because he is in the hospital.  She is concerned because she has seen bad things happen in other situations and is concerned they will happen now.  I reassured her that we would do our very best to take care of him.  I reassured her that he did need to be there right now because were concerned about his heart.  She was grateful for the call.  Rosaria Ferries, PA-C 11/09/2018 8:11 PM Beeper [phone]

## 2018-11-09 NOTE — ED Notes (Signed)
Dr. Denton Ar poitassium and troponin

## 2018-11-09 NOTE — Progress Notes (Signed)
ANTICOAGULATION CONSULT NOTE - Initial Consult  Pharmacy Consult for heparin  Indication: chest pain/ACS  Allergies  Allergen Reactions  . Ramipril Swelling and Other (See Comments)    Angioedema   . Statins Other (See Comments)    Elevated LFTs with statins in the past  . Latex Itching and Rash    Patient Measurements: Height: _0  (167.6 cm) Weight: 134 lb 7.7 oz (61 kg) IBW/kg (Calculated) : 63.8 Heparin Dosing Weight: TBW  Vital Signs: Temp: 97.8 F (36.6 C) (06/30 1652) Temp Source: Oral (06/30 1652) BP: 132/80 (06/30 1930) Pulse Rate: 84 (06/30 1930)  Labs: Recent Labs    11/09/18 1654 11/09/18 1804  HGB 12.5*  --   HCT 38.4*  --   PLT 183  --   LABPROT 15.1  --   INR 1.2  --   CREATININE 1.81* 1.80*  TROPONINIHS 4,554*  --     Estimated Creatinine Clearance: 27.3 mL/min (A) (by C-G formula based on SCr of 1.8 mg/dL (H)).   Medical History: Past Medical History:  Diagnosis Date  . CAD (coronary artery disease)    a. CABG (2003)  b. 9/17/6: NSTEMI s/p overlapping DES x2 to LAD and DESx1 to SVG-->OM   . Carotid artery disease ([place])    a. 09/2013: carotid dopplers with stable 1-39% ICA stenosis bilaterally  //  b. Carotid US 6/71: RICA 24-58%; LICA 0-99% >> FU 1 year // c. Carotid US 09/2016: Bilateral ICA 1-39 >> follow-up 1 year  //  d. Carotid US 5/19: Bilateral ICA 1-39; decreased flow in right subclavian artery   . CHF (congestive heart failure) ([place])   . Dementia ([place])   . History of echocardiogram    Echo 4/18: mild conc LVH, EF 35-40, diff HK, anteroseptal and inf-septal HK, Gr 2 DD, mild AS (mean 13), mod AI, mildly dilated asc Ao (43 mm), mod MAC, mod MR, severe LAE, mild TR, mod PI, PASP 43  . Hyperlipidemia   . Hypertension   . Internal hemorrhoids   . Ischemic cardiomyopathy    a. 2D ECHO 01/28/15 w/ EF 25-30%, severe HK of the basal-midinferoseptal myocardium, basal-midinferior myocardium and midanteroseptal myocardium. Akinesis of apical  inferior and apicalanterior myocardium. G2DD. Mild AS, Asc aorta diameter 85m. Mild dilation of aortic root. Mild MR, mild PR and PA peak pressure 36 mmg HG.  . Peptic ulcer disease   . [place] ([place]    a. s/p seed implants    Assessment: [id number] presenting with CP.  Chronic anemia stable, plts 183, no anticoagulation PTA.    Goal of Therapy:  Heparin level 0.3-0.7 units/ml Monitor platelets by anticoagulation protocol: Yes   Plan:  Heparin 3500 units IV x1, and gtt at 750 units/hr F/u 8 hour heparin level  JBertis Ruddy PharmD Clinical Pharmacist Please check AMION for all [facility] 11/09/2018 7:41 PM

## 2018-11-09 NOTE — ED Provider Notes (Signed)
[facility] EMERGENCY DEPARTMENT Provider Note   CSN: [id number] Arrival date & time: 11/09/18  1639     History   Chief Complaint Chief Complaint  Patient presents with  . Chest Pain    HPI [name] is a 84 y.o. male.  HPI: A 84 year old patient with a history of hypertension presents for evaluation of chest pain. Initial onset of pain was approximately 1-3 hours ago. The patient's chest pain is not worse with exertion. The patient's chest pain is middle- or left-sided, is not well-localized, is not described as heaviness/pressure/tightness, is not sharp and does not radiate to the arms/jaw/neck. The patient does not complain of nausea and denies diaphoresis. The patient has no history of stroke, has no history of peripheral artery disease, has not smoked in the past 90 days, denies any history of treated diabetes, has no relevant family history of coronary artery disease (first degree relative at less than age 19), has no history of hypercholesterolemia and does not have an elevated BMI (>=30).   The history is provided by the patient.  Chest Pain Pain location:  L chest Pain radiates to:  Does not radiate Pain severity:  Unable to specify Onset quality:  Unable to specify Progression:  Resolved Worsened by:  Exertion Associated symptoms: shortness of breath   Associated symptoms: no abdominal pain, no back pain, no cough, no fever and no headache   Risk factors: coronary artery disease and male sex      Level 5 caveat secondary to dementia.  Patient went to urgent care today for chest pain and shortness of breath that has been going on he says for 2 hours EMS states for 3 weeks.  Supposedly worse with exertion.  Patient states he is now pain-free.  Past Medical History:  Diagnosis Date  . CAD (coronary artery disease)    a. CABG (2003)  b. 9/17/6: NSTEMI s/p overlapping DES x2 to LAD and DESx1 to SVG-->OM   . Carotid artery disease ([place])    a. 09/2013:  carotid dopplers with stable 1-39% ICA stenosis bilaterally  //  b. Carotid US 1/76: RICA 16-07%; LICA 3-71% >> FU 1 year // c. Carotid US 09/2016: Bilateral ICA 1-39 >> follow-up 1 year  //  d. Carotid US 5/19: Bilateral ICA 1-39; decreased flow in right subclavian artery   . CHF (congestive heart failure) ([place])   . Dementia ([place])   . History of echocardiogram    Echo 4/18: mild conc LVH, EF 35-40, diff HK, anteroseptal and inf-septal HK, Gr 2 DD, mild AS (mean 13), mod AI, mildly dilated asc Ao (43 mm), mod MAC, mod MR, severe LAE, mild TR, mod PI, PASP 43  . Hyperlipidemia   . Hypertension   . Internal hemorrhoids   . Ischemic cardiomyopathy    a. 2D ECHO 01/28/15 w/ EF 25-30%, severe HK of the basal-midinferoseptal myocardium, basal-midinferior myocardium and midanteroseptal myocardium. Akinesis of apical inferior and apicalanterior myocardium. G2DD. Mild AS, Asc aorta diameter 50m. Mild dilation of aortic root. Mild MR, mild PR and PA peak pressure 36 mmg HG.  . Peptic ulcer disease   . Prostate CA ([facility]    a. s/p seed implants    Patient Active Problem List   Diagnosis Date Noted  . Rectal bleeding 08/03/2017  . Penile bleeding 08/03/2017  . Chronic combined systolic and diastolic heart failure ([facility] 09/04/2016  . Chronic kidney disease, stage 3 ([place] 08/04/2016  . Carotid artery disease ([place]   .  Dementia ([place])   . Coronary artery disease of native heart with stable angina pectoris ([place])   . Ischemic cardiomyopathy   . Essential hypertension 01/27/2015  . Hyperlipidemia, unspecified 01/27/2015  . History of non-ST elevation myocardial infarction (NSTEMI) 01/27/2015  . Abdominal bruit 09/08/2013  . Mitral valvular insufficiency and aortic valvular insufficiency 03/23/2009  . PROSTATE CANCER 03/22/2009  . Hypertensive heart disease with chronic combined systolic and diastolic congestive heart failure ([place]) 03/22/2009    Past Surgical History:  Procedure Laterality Date  .  CARDIAC CATHETERIZATION N/A 01/27/2015   Procedure: Left Heart Cath and Coronary Angiography;  Surgeon: Wellington Hampshire, MD;  Location: [facility];  Service: Cardiovascular;  Laterality: N/A;  . CARDIAC SURGERY  2003   SVG LAD, SVG to right coronary artery, SVG to marginal        Home Medications    Prior to Admission medications   Medication Sig Start Date End Date Taking? Authorizing Provider  aspirin EC 81 MG tablet Take 1 tablet (81 mg total) by mouth daily. 08/03/17   End, Harrell Gave, MD  ezetimibe (ZETIA) 10 MG tablet Take 1 tablet (10 mg total) by mouth daily. 10/26/18   [name] T, PA-C  furosemide (LASIX) 40 MG tablet Take 1 tablet (40 mg total) by mouth daily. 10/26/18   [name] T, PA-C  isosorbide mononitrate (IMDUR) 30 MG 24 hr tablet Take 1 tablet (30 mg total) by mouth daily. 09/29/18   [name] T, PA-C  loratadine (CLARITIN) 10 MG tablet Take 10 mg by mouth daily.    [provider]  nitroGLYCERIN (NITROSTAT) 0.4 MG SL tablet place 1 tablet under the tongue if needed every 5 minutes for chest pain  UP TO 3 DOSES 04/19/18   Liliane Shi, PA-C    Family History Family History  Problem Relation Age of Onset  . Stroke Father     Social History Social History   Tobacco Use  . Smoking status: Former Research scientist (life sciences)  . Smokeless tobacco: Never Used  Substance Use Topics  . Alcohol use: No  . Drug use: No     Allergies   Ramipril, Statins, and Latex   Review of Systems Review of Systems  Unable to perform ROS: Dementia  Constitutional: Negative for fever.  Respiratory: Positive for shortness of breath. Negative for cough.   Cardiovascular: Positive for chest pain.  Gastrointestinal: Negative for abdominal pain.  Musculoskeletal: Negative for back pain.  Neurological: Negative for headaches.     Physical Exam Updated Vital Signs BP 126/76 (BP Location: Right Arm)   Pulse 81   Temp 97.8 F (36.6 C) (Oral)   Resp 18   Ht _0   (1.676 m)   Wt 61 kg   SpO2 100%   BMI 21.71 kg/m   Physical Exam Vitals signs and nursing note reviewed.  Constitutional:      Appearance: He is well-developed.  HENT:     Head: Normocephalic and atraumatic.  Eyes:     Conjunctiva/sclera: Conjunctivae normal.  Neck:     Musculoskeletal: Neck supple.  Cardiovascular:     Rate and Rhythm: Normal rate and regular rhythm.     Heart sounds: No murmur.  Pulmonary:     Effort: Pulmonary effort is normal. No respiratory distress.     Breath sounds: Normal breath sounds.  Abdominal:     Palpations: Abdomen is soft.     Tenderness: There is no abdominal tenderness.  Musculoskeletal: Normal range of motion.  Right lower leg: He exhibits no tenderness. No edema.     Left lower leg: He exhibits no tenderness. No edema.  Skin:    General: Skin is warm and dry.     Capillary Refill: Capillary refill takes less than 2 seconds.  Neurological:     General: No focal deficit present.     Mental Status: He is alert. He is disoriented.     Sensory: No sensory deficit.     Motor: No weakness.      ED Treatments / Results  Labs (all labs ordered are listed, but only abnormal results are displayed) Labs Reviewed  BASIC METABOLIC PANEL - Abnormal; Notable for the following components:      Result Value   Potassium 6.4 (*)    Glucose, Bld 108 (*)    BUN 33 (*)    Creatinine, Ser 1.81 (*)    Calcium 8.7 (*)    GFR calc non Af Amer 34 (*)    GFR calc Af Amer 39 (*)    All other components within normal limits  CBC - Abnormal; Notable for the following components:   Hemoglobin 12.5 (*)    HCT 38.4 (*)    All other components within normal limits  TROPONIN I (HIGH SENSITIVITY) - Abnormal; Notable for the following components:   Troponin I (High Sensitivity) 4,554 (*)    All other components within normal limits  SARS CORONAVIRUS 2 (HOSPITAL ORDER, [facility])  PROTIME-INR  TROPONIN I (HIGH SENSITIVITY)   BRAIN NATRIURETIC PEPTIDE  BASIC METABOLIC PANEL    EKG EKG Interpretation  Date/Time:  Tuesday November 09 2018 16:51:30 EDT Ventricular Rate:  75 PR Interval:    QRS Duration: 119 QT Interval:  411 QTC Calculation: 460 R Axis:   -31 Text Interpretation:  Sinus rhythm Prolonged PR interval Incomplete left bundle branch block LVH with secondary repolarization abnormality minimal change compared with prior 2/19 Confirmed by Aletta Edouard [id number]) on 11/10/2018 8:55:43 AM   Radiology Dg Chest 2 View  Result Date: 11/09/2018 CLINICAL DATA:  85 year old male with chest pain and shortness of breath for 1 week. EXAM: CHEST - 2 VIEW COMPARISON:  07/05/2018 and earlier. FINDINGS: Upright AP and lateral views. Stable cardiomegaly and mediastinal contours. Prior CABG. Stable tortuosity of the thoracic aorta. Other mediastinal contours are within normal limits. Visualized tracheal air column is within normal limits. Mildly lower lung volumes. Trace fluid in the major fissures today, although no layering pleural effusion is evident. No pneumothorax or consolidation. Pulmonary vascularity is increased. No acute osseous abnormality identified. Negative visible bowel gas pattern. IMPRESSION: 1. Mildly lower lung volumes with increased pulmonary vascularity and trace pleural fluid. Consider mild or developing edema. 2. Stable cardiomegaly. Electronically Signed   By: Genevie Ann M.D.   On: 11/09/2018 17:39    Procedures .Critical Care Performed by: Hayden Rasmussen, MD Authorized by: Hayden Rasmussen, MD   Critical care provider statement:    Critical care time (minutes):  45   Critical care time was exclusive of:  Separately billable procedures and treating other patients   Critical care was necessary to treat or prevent imminent or life-threatening deterioration of the following conditions:  Cardiac failure   Critical care was time spent personally by me on the following activities:  Discussions with  consultants, evaluation of patient's response to treatment, examination of patient, ordering and performing treatments and interventions, ordering and review of laboratory studies, ordering and review of radiographic  studies, pulse oximetry, re-evaluation of patient's condition, obtaining history from patient or surrogate, review of old charts and development of treatment plan with patient or surrogate   I assumed direction of critical care for this patient from another provider in my specialty: no     (including critical care time)  Medications Ordered in ED Medications  sodium chloride flush (NS) 0.9 % injection 3 mL (3 mLs Intravenous Not Given 11/09/18 2204)  heparin ADULT infusion 100 units/mL (25000 units/223m sodium chloride 0.45%) (750 Units/hr Intravenous Restarted 11/10/18 0445)  furosemide (LASIX) injection 40 mg (40 mg Intravenous Given 11/10/18 0844)  aspirin EC tablet 81 mg (81 mg Oral Given 11/10/18 0845)  ezetimibe (ZETIA) tablet 10 mg (10 mg Oral Given 11/10/18 0844)  isosorbide mononitrate (IMDUR) 24 hr tablet 30 mg (30 mg Oral Given 11/10/18 0844)  nitroGLYCERIN (NITROSTAT) SL tablet 0.4 mg (has no administration in time range)  acetaminophen (TYLENOL) tablet 650 mg (has no administration in time range)  ondansetron (ZOFRAN) injection 4 mg (has no administration in time range)  sodium chloride flush (NS) 0.9 % injection 3 mL (3 mLs Intravenous Given 11/10/18 0845)  sodium chloride flush (NS) 0.9 % injection 3 mL (has no administration in time range)  0.9 %  sodium chloride infusion (has no administration in time range)  heparin bolus via infusion 3,500 Units (3,500 Units Intravenous Bolus from Bag 11/09/18 2006)     Initial Impression / Assessment and Plan / ED Course  I have reviewed the triage vital signs and the nursing notes.  Pertinent labs & imaging results that were available during my care of the patient were reviewed by me and considered in my medical decision making (see  chart for details).  Clinical Course as of Nov 09 852  Tue Nov 09, 2018  1803 ECG is sinus rhythm with first-degree AV block incomplete left bundle branch block LVH with re-pole ST depressions V5 and 6 and inferiorly similar to prior EKG 2015.   [MB]  1808 Differential diagnosis includes unstable angina and STEMI hyperkalemia renal failure pneumonia Covid   [MB]  1842 Discussed with cardiology RSuanne MarkerPA who will evaluate the patient and review with her attending.  I spoke with the patient's wife who was very angry about him being here and does not feel he needs to be in the hospital.  I explained that he is having a cardiac condition is very important that he stay in the hospital.  I asked if the nurse can reach out to her and see if she can come up with may be a family member so we can have a discussion.   [MB]  1936 Patient seen by cardiology and repeat potassium is 5.3.  They recommend heparin per protocol.  They will likely be admitting the patient.  They can try to reach out to the patient's wife to see if we can have her understand what is going on.   [MB]  1955 Patient was evaluated by cardiology and they are recommending a medical admission.  They agree with heparin and they are running some orders for diuresis.   [MB]    Clinical Course User Index [MB] BHayden Rasmussen MD    HEAR Score: 5   Final Clinical Impressions(s) / ED Diagnoses   Final diagnoses:  NSTEMI (non-ST elevated myocardial infarction) ([facility]  Hyperkalemia  Acute on chronic congestive heart failure, unspecified heart failure type ([facility]    ED Discharge Orders    None  Hayden Rasmussen, MD 11/10/18 [phone]

## 2018-11-09 NOTE — Consult Note (Addendum)
Cardiology Consultation:   Patient ID: [name]; [id number]; December 26, 1934   Admit date: 11/09/2018 Date of Consult: 11/09/2018  Primary Care Provider: [facility] [place] Primary Cardiologist: Mertie Moores, MD care transferred, has not seen [name], [facility] 10/19/2018 Televisit Primary Electrophysiologist:  None  Patient Profile:   [name] is a 84 y.o. male with a hx of CABG 2003, AI, S-D-CHF, CKD, HTN, HLD intol statins w/ elevated LFTs and CK, carotid dz, hx med non-compliance, NSTEMI 2016 w/ DES LAD & SVG-OM, who is being seen today for the evaluation of chest pain and elevated troponin at the request of Dr Melina Copa.  History of Present Illness:   [name] is able to tell me that he had some chest pain today. He is not able to characterize it well. He says it was bad. He says it got better. He says they got his medicine mixed up, it is straightened out now. He took some medication for the pain, not sure what.   He denies SOB, N&V or diaphoresis.   He feels that his wife got his medication straight, says he was not taking the right things.   He is not having any pain right now.    Past Medical History:  Diagnosis Date  . CAD (coronary artery disease)    a. CABG (2003)  b. 9/17/6: NSTEMI s/p overlapping DES x2 to LAD and DESx1 to SVG-->OM   . Carotid artery disease ([place])    a. 09/2013: carotid dopplers with stable 1-39% ICA stenosis bilaterally  //  b. Carotid US 6/31: RICA 49-70%; LICA 2-63% >> FU 1 year // c. Carotid US 09/2016: Bilateral ICA 1-39 >> follow-up 1 year  //  d. Carotid US 5/19: Bilateral ICA 1-39; decreased flow in right subclavian artery   . CHF (congestive heart failure) ([place])   . Dementia ([place])   . History of echocardiogram    Echo 4/18: mild conc LVH, EF 35-40, diff HK, anteroseptal and inf-septal HK, Gr 2 DD, mild AS (mean 13), mod AI, mildly dilated asc Ao (43 mm), mod MAC, mod MR, severe LAE, mild TR, mod PI, PASP 43  .  Hyperlipidemia   . Hypertension   . Internal hemorrhoids   . Ischemic cardiomyopathy    a. 2D ECHO 01/28/15 w/ EF 25-30%, severe HK of the basal-midinferoseptal myocardium, basal-midinferior myocardium and midanteroseptal myocardium. Akinesis of apical inferior and apicalanterior myocardium. G2DD. Mild AS, Asc aorta diameter 52m. Mild dilation of aortic root. Mild MR, mild PR and PA peak pressure 36 mmg HG.  . Peptic ulcer disease   . Prostate CA ([facility]    a. s/p seed implants    Past Surgical History:  Procedure Laterality Date  . CARDIAC CATHETERIZATION N/A 01/27/2015   Procedure: Left Heart Cath and Coronary Angiography;  Surgeon: MWellington Hampshire MD;  [facility];  Service: Cardiovascular;  Laterality: N/A;  . CARDIAC SURGERY  2003   SVG LAD, SVG to right coronary artery, SVG to marginal     Prior to Admission medications   Medication Sig Start Date End Date Taking? Authorizing Provider  aspirin EC 81 MG tablet Take 1 tablet (81 mg total) by mouth daily. 08/03/17   End, CHarrell Gave MD  ezetimibe (ZETIA) 10 MG tablet Take 1 tablet (10 mg total) by mouth daily. 10/26/18   WRichardson DoppT, PA-C  furosemide (LASIX) 40 MG tablet Take 1 tablet (40 mg total) by mouth daily. 10/26/18   WLiliane Shi  PA-C  isosorbide mononitrate (IMDUR) 30 MG 24 hr tablet Take 1 tablet (30 mg total) by mouth daily. 09/29/18   [name] T, PA-C  loratadine (CLARITIN) 10 MG tablet Take 10 mg by mouth daily.    [provider]  nitroGLYCERIN (NITROSTAT) 0.4 MG SL tablet place 1 tablet under the tongue if needed every 5 minutes for chest pain  UP TO 3 DOSES 04/19/18   [name] T, PA-C   Inpatient Medications: Scheduled Meds: . sodium chloride flush  3 mL Intravenous Once   Continuous Infusions:  PRN Meds:  Allergies:    Allergies  Allergen Reactions  . Ramipril Swelling and Other (See Comments)    Angioedema   . Statins Other (See Comments)    Elevated LFTs with  statins in the past  . Latex Itching and Rash    Social History:   Social History   Socioeconomic History  . Marital status: Married    Spouse name: Not on file  . Number of children: Not on file  . Years of education: Not on file  . Highest education level: Not on file  Occupational History  . Occupation: Retired  Scientific laboratory technician  . Financial resource strain: Not on file  . Food insecurity    Worry: Not on file    Inability: Not on file  . Transportation needs    Medical: Not on file    Non-medical: Not on file  Tobacco Use  . Smoking status: Former Research scientist (life sciences)  . Smokeless tobacco: Never Used  Substance and Sexual Activity  . Alcohol use: No  . Drug use: No  . Sexual activity: Not on file  Lifestyle  . Physical activity    Days per week: Not on file    Minutes per session: Not on file  . Stress: Not on file  Relationships  . Social Herbalist on phone: Not on file    Gets together: Not on file    Attends religious service: Not on file    Active member of club or organization: Not on file    Attends meetings of clubs or organizations: Not on file    Relationship status: Not on file  . Intimate partner violence    Fear of current or ex partner: Not on file    Emotionally abused: Not on file    Physically abused: Not on file    Forced sexual activity: Not on file  Other Topics Concern  . Not on file  Social History Narrative  . Not on file    Family History:   Family History  Problem Relation Age of Onset  . Stroke Father    Family Status:  Family Status  Relation Name Status  . Father  Deceased  . PGF  Deceased  . PGM  Deceased  . MGF  Deceased  . MGM  Deceased  . Mother  Deceased    ROS:  Please see the history of present illness.  All other ROS reviewed and negative.     Physical Exam/Data:   Vitals:   11/09/18 1830 11/09/18 1845 11/09/18 1900 11/09/18 1930  BP: 131/67 127/78 113/73 132/80  Pulse: 77 72 69 84  Resp: (!) _0 Temp:      TempSrc:      SpO2: 100% 97% 98% 100%  Weight:      Height:       No intake or output data in the 24 hours ending  11/09/18 1942 Filed Weights   11/09/18 1650  Weight: 61 kg   Body mass index is 21.71 kg/m.  General:  Well nourished, well developed, in no acute distress HEENT: normal Lymph: no adenopathy Neck: JVD 9 cm Endocrine:  No thryomegaly Vascular: No carotid bruits; 4/4 extremity pulses 2+, without bruits  Cardiac:  normal S1, S2; RRR; SEM & DM Lungs:  Rales bases bilaterally, no wheezing, rhonchi    Abd: soft, nontender, no hepatomegaly  Ext: no edema Musculoskeletal:  No deformities, BUE and BLE strength normal and equal Skin: warm and dry  Neuro:  CNs 2-12 intact, no focal abnormalities noted Psych:  Normal affect   EKG:  The EKG was personally reviewed and demonstrates:  11/09/2018 SR, HR 77, some inferior ST depression Telemetry:  Telemetry was personally reviewed and demonstrates:  SR  Relevant CV Studies:  ECHO: 10/11/2018  1. The left ventricle has moderately reduced systolic function, with an ejection fraction of 35-40%. The cavity size was normal. Left ventricular diastolic Doppler parameters are consistent with pseudonormalization.  2. Moderate hypokinesis of the left ventricular, mid-apical anterior wall.  3. Severe hypokinesis of the left ventricular, basal-mid anteroseptal wall and inferoseptal wall.  4. Right atrial size was mildly dilated.  5. The mitral valve is abnormal. Mild thickening of the mitral valve leaflets. Mild calcification of the mitral valve leaflet. There is mild mitral annular calcification present. Mitral valve regurgitation is mild to moderate by color flow Doppler.  6. Anterior mitral valve leaflet with restricted mobility, visually appears partially restricted by AR jet.  7. The aortic valve is tricuspid. Moderate thickening of the aortic valve. Moderate calcification of the aortic valve. Aortic valve regurgitation is  moderate by color flow Doppler. The jet is posteriorly-directed. Mild-moderate stenosis of the aortic  valve.  8. The pulmonic valve was grossly normal. Pulmonic valve regurgitation is moderate is mild by color flow Doppler.  9. There is moderate dilatation of the ascending aorta measuring 43 mm.  CATH: 01/27/2015  Prox RCA lesion, 100% stenosed.  Prox Cx lesion, 100% stenosed.  LM lesion, 40% stenosed.  SVG was injected .  Origin lesion, 100% stenosed.  SVG was injected is normal in caliber.  SVG was injected .  Origin lesion, 100% stenosed.  Mid LAD-2 lesion, 99% stenosed.  Mid LAD-1 lesion, 99% stenosed. There is a 0% residual stenosis post intervention. The lesion was not previously treated.  Mid Graft lesion, 90% stenosed. There is a 10% residual stenosis post intervention.  There is severe left ventricular systolic dysfunction.   1. Severe three-vessel coronary artery disease with chronically occluded left circumflex and right coronary artery. Severe subtotal occlusion in the mid LAD. 2. Chronically occluded SVG to RCA (with left-to-right collaterals) and SVG to LAD. Severe 90% stenosis in the SVG to OM. 3. Severely reduced LV systolic function with segmental wall motion abnormalities as outlined above and severe mitral regurgitation. 4. Severely elevated left ventricular end-diastolic pressure. 5. Successful angioplasty and drug-eluting stent placement to the LAD 2 in an overlapped fashion and 1 drug-eluting stent placement to SVG to OM 2.  Recommendations: Continue blood pressure control. I increased the dose of intravenous nitroglycerin. I started small dose carvedilol and losartan. There is reported allergy to ACE inhibitor use. It is not really clear if he is truly allergic to aspirin or not. He does not seem to be as I asked him and checked with his wife. It seems to be more likely intolerance. Continue dual antiplatelets therapy for  at least one year. Check  echocardiogram to evaluate his mitral regurgitation which hopefully should improve with revascularization. I elected to start Aggrastat for 6 hours.  Diagnostic Dominance: Right  Intervention     Laboratory Data:  Chemistry Recent Labs  Lab 11/09/18 1654 11/09/18 1804  NA 135 136  K 6.4* 5.3*  CL 103 103  CO2 23 22  GLUCOSE 108* 101*  BUN 33* 33*  CREATININE 1.81* 1.80*  CALCIUM 8.7* 8.7*  GFRNONAA 34* 34*  GFRAA 39* 40*  ANIONGAP 9 11    Lab Results  Component Value Date   ALT 14 12/21/2017   AST 15 12/21/2017   ALKPHOS 78 12/21/2017   BILITOT 0.8 12/21/2017   Hematology Recent Labs  Lab 11/09/18 1654  WBC 6.2  RBC 4.41  HGB 12.5*  HCT 38.4*  MCV 87.1  MCH 28.3  MCHC 32.6  RDW 15.0  PLT 183   High Sensitivity Troponin:   Recent Labs  Lab 11/09/18 1654  TROPONINIHS 4,554*     BNP    Component Value Date/Time   BNP 1,709.4 (H) 11/09/2018 1804   Lab Results  Component Value Date   TSH 5.520 (H) 10/05/2018    Lipids: Lab Results  Component Value Date   CHOL 177 12/21/2017   HDL 62 12/21/2017   LDLCALC 102 (H) 12/21/2017   LDLDIRECT 126 (H) 09/07/2017   TRIG 63 12/21/2017   CHOLHDL 2.9 12/21/2017   HgbA1c: Lab Results  Component Value Date   HGBA1C 6.2 (H) 01/27/2015   Magnesium:  Magnesium  Date Value Ref Range Status  01/27/2015 1.7 1.7 - 2.4 mg/dL Final     Radiology/Studies:  Dg Chest 2 View  Result Date: 11/09/2018 CLINICAL DATA:  84 year old male with chest pain and shortness of breath for 1 week. EXAM: CHEST - 2 VIEW COMPARISON:  07/05/2018 and earlier. FINDINGS: Upright AP and lateral views. Stable cardiomegaly and mediastinal contours. Prior CABG. Stable tortuosity of the thoracic aorta. Other mediastinal contours are within normal limits. Visualized tracheal air column is within normal limits. Mildly lower lung volumes. Trace fluid in the major fissures today, although no layering pleural effusion is evident. No  pneumothorax or consolidation. Pulmonary vascularity is increased. No acute osseous abnormality identified. Negative visible bowel gas pattern. IMPRESSION: 1. Mildly lower lung volumes with increased pulmonary vascularity and trace pleural fluid. Consider mild or developing edema. 2. Stable cardiomegaly. Electronically Signed   By: Genevie Ann M.D.   On: 11/09/2018 17:39    Assessment and Plan:   Active Problems:   NSTEMI (non-ST elevated myocardial infarction) (HCC) - Admit, continue to cycle enzymes - ck limited echo - once data reviewed, decide on med rx for CAD vs cath w/ increased risk    Possible CHF - see CXR  - discuss w/ MD giving some IV Lasix    Renal insufficiency - Cr 1.73 one year ago - follow w/ diuresis    Hyperkalemia - K+ 6.4 had some hemolysis>>5.3 on recheck - follow with diuresis   For questions or updates, please contact [place] Please consult [email or web address] for contact info under Cardiology/STEMI.   Signed, Rosaria Ferries, PA-C  11/09/2018 7:42 PM   The patient was seen, examined and discussed with Rosaria Ferries, PA-C and I agree with the above.   83 y.o. male with a hx of CABG 2003, AI, S-D-CHF, CKD, HTN, HLD intol statins w/ elevated LFTs and CK, carotid dz, hx med non-compliance, NSTEMI 2016 w/ DES LAD & SVG-OM,  who is being seen today for the evaluation of chest pain and elevated troponin.  The patient appears confused but are able to obtain some history from him and some from his wife.  Apparently he has been doing well until recently when he developed on and off chest pain that are nonexertional, they are retrosternal pressure-like, he also felt some exertional shortness of breath.  Patient is worried that these medications were exchanging pharmacy however this cannot be confirmed.  He is currently chest pain-free and feels comfortable.  Has not had any recent lower extremity edema no fever chills nausea or vomiting.  Physical exam he has 4 cm JVD  bilaterally, S1-S2 no significant murmur, he has minimal crackers at the bases.  No lower extremity edema his extremities are somewhat cold and peripheral pulses are weak. Chest x-ray shows mild pulmonary edema.  Creatinine 1.8 baseline 1.7, D5 100 now trending down to 4300.  BNP 12,000.  Potassium 5.3.  Assessment and plan  Acute on chronic combined systolic diastolic CHF Non-STEMI CAD status post CABG Hypertension Hyperlipidemia with intolerance to statins Acute on chronic kidney failure CKD stage IV  We will start IV diuresis with Lasix 40 mg twice daily, we will follow creatinine and electrolytes closely, we will start heparin drip, will obtain limited echocardiogram for evaluation of LVEF and wall motion abnormalities.  For now we will hold off with cardiac catheterization as the patient is confused, if we end up proceeding with cardiac cath we would want him to be optimized from heart failure standpoint.  Ena Dawley, MD 11/09/2018

## 2018-11-09 NOTE — ED Notes (Signed)
ED TO INPATIENT HANDOFF REPORT  ED Nurse Name and Phone #:  [phone]  S Name/Age/Gender [name] 84 y.o. male Room/Bed: 035C/035C  Code Status   Code Status: Prior  Home/SNF/Other Home Patient oriented to: self, place, time and situation Is this baseline? Yes   Triage Complete: Triage complete  Chief Complaint Bad EKG, heart block  Triage Note Pt BIB [facility] for eval of CP x 3 weeks, worse today, not associated w/ trauma. Reports worse w/ exertion. Went to [facility] this afternoon, sent here for ? New 1st degree block w/ non specific EKG changes. Rec'd 324 ASA, 1 SL NTG w/ improvement of CP.    Allergies Allergies  Allergen Reactions  . Ramipril Swelling and Other (See Comments)    Angioedema   . Statins Other (See Comments)    Elevated LFTs with statins in the past  . Latex Itching and Rash    Level of Care/Admitting Diagnosis ED Disposition    ED Disposition Condition [place]: [place] [[id number]]  Level of Care: Telemetry Cardiac [103]  Covid Evaluation: Screening Protocol (No Symptoms)  Diagnosis: NSTEMI (non-ST elevated myocardial infarction) [facility]) [[id number]]  Admitting Physician: Vianne Bulls [[id number]]  Attending Physician: Vianne Bulls [[id number]]  Estimated length of stay: past midnight tomorrow  Certification:: I certify this patient will need inpatient services for at least 2 midnights  PT Class (Do Not Modify): Inpatient [101]  PT Acc Code (Do Not Modify): Private [1]       B Medical/Surgery History Past Medical History:  Diagnosis Date  . CAD (coronary artery disease)    a. CABG (2003)  b. 9/17/6: NSTEMI s/p overlapping DES x2 to LAD and DESx1 to SVG-->OM   . Carotid artery disease ([place])    a. 09/2013: carotid dopplers with stable 1-39% ICA stenosis bilaterally  //  b. Carotid US 4/88: RICA 89-16%; LICA 9-45% >> FU 1 year // c. Carotid US 09/2016: Bilateral ICA 1-39 >> follow-up 1 year  //  d. Carotid US  5/19: Bilateral ICA 1-39; decreased flow in right subclavian artery   . CHF (congestive heart failure) ([place])   . Dementia ([place])   . History of echocardiogram    Echo 4/18: mild conc LVH, EF 35-40, diff HK, anteroseptal and inf-septal HK, Gr 2 DD, mild AS (mean 13), mod AI, mildly dilated asc Ao (43 mm), mod MAC, mod MR, severe LAE, mild TR, mod PI, PASP 43  . Hyperlipidemia   . Hypertension   . Internal hemorrhoids   . Ischemic cardiomyopathy    a. 2D ECHO 01/28/15 w/ EF 25-30%, severe HK of the basal-midinferoseptal myocardium, basal-midinferior myocardium and midanteroseptal myocardium. Akinesis of apical inferior and apicalanterior myocardium. G2DD. Mild AS, Asc aorta diameter 65m. Mild dilation of aortic root. Mild MR, mild PR and PA peak pressure 36 mmg HG.  . Peptic ulcer disease   . Prostate CA ([facility]    a. s/p seed implants   Past Surgical History:  Procedure Laterality Date  . CARDIAC CATHETERIZATION N/A 01/27/2015   Procedure: Left Heart Cath and Coronary Angiography;  Surgeon: MWellington Hampshire MD;  [facility];  Service: Cardiovascular;  Laterality: N/A;  . CARDIAC SURGERY  2003   SVG LAD, SVG to right coronary artery, SVG to marginal     A IV Location/Drains/Wounds Patient Lines/Drains/Airways Status   Active Line/Drains/Airways    Name:   Placement date:   Placement time:  Site:   Days:   Peripheral IV 11/09/18 Left Antecubital   11/09/18    -    Antecubital   less than 1   Peripheral IV 11/09/18 Right Forearm   11/09/18    2012    Forearm   less than 1          Intake/Output Last 24 hours No intake or output data in the 24 hours ending 11/09/18 2048  Labs/Imaging Results for orders placed or performed during the hospital encounter of 11/09/18 (from the past 48 hour(s))  Basic metabolic panel     Status: Abnormal   Collection Time: 11/09/18  4:54 PM  Result Value Ref Range   Sodium 135 135 - 145 mmol/L   Potassium 6.4 (HH) 3.5 - 5.1 mmol/L     Comment: CRITICAL RESULT CALLED TO, READ BACK BY AND VERIFIED WITH: M.GAGE RN 1751 11/09/2018 MCCORMICK K SLIGHT HEMOLYSIS    Chloride 103 98 - 111 mmol/L   CO2 23 22 - 32 mmol/L   Glucose, Bld 108 (H) 70 - 99 mg/dL   BUN 33 (H) 8 - 23 mg/dL   Creatinine, Ser 1.81 (H) 0.61 - 1.24 mg/dL   Calcium 8.7 (L) 8.9 - 10.3 mg/dL   GFR calc non Af Amer 34 (L) >60 mL/min   GFR calc Af Amer 39 (L) >60 mL/min   Anion gap 9 5 - 15    Comment: Performed at [facility], [address], [place], [place] [address]  CBC     Status: Abnormal   Collection Time: 11/09/18  4:54 PM  Result Value Ref Range   WBC 6.2 4.0 - 10.5 K/uL   RBC 4.41 4.22 - 5.81 MIL/uL   Hemoglobin 12.5 (L) 13.0 - 17.0 g/dL   HCT 38.4 (L) 39.0 - 52.0 %   MCV 87.1 80.0 - 100.0 fL   MCH 28.3 26.0 - 34.0 pg   MCHC 32.6 30.0 - 36.0 g/dL   RDW 15.0 11.5 - 15.5 %   Platelets 183 150 - 400 K/uL   nRBC 0.0 0.0 - 0.2 %    Comment: Performed at [facility], [place] [address], [place], [place] [address]  Troponin I (High Sensitivity)     Status: Abnormal   Collection Time: 11/09/18  4:54 PM  Result Value Ref Range   Troponin I (High Sensitivity) 4,554 (HH) <18 ng/L    Comment: CRITICAL RESULT CALLED TO, READ BACK BY AND VERIFIED WITH: M.GAGE RN 0981 11/09/2018 MCCORMICK K (NOTE) Elevated high sensitivity troponin I (hsTnI) values and significant  changes across serial measurements may suggest ACS but many other  chronic and acute conditions are known to elevate hsTnI results.  Refer to the Links section for chest pain algorithms and additional  guidance. Performed at [facility], [place] [address] [place], [place] [address]   Protime-INR (order if Patient is taking Coumadin / Warfarin)     Status: None   Collection Time: 11/09/18  4:54 PM  Result Value Ref Range   Prothrombin Time 15.1 11.4 - 15.2 seconds   INR 1.2 0.8 - 1.2    Comment: (NOTE) INR goal varies based on device and disease states. Performed at  [facility], [place] [address] [place], [place] [address]   Brain natriuretic peptide     Status: Abnormal   Collection Time: 11/09/18  6:04 PM  Result Value Ref Range   B Natriuretic Peptide 1,709.4 (H) 0.0 - 100.0 pg/mL  Comment: Performed at [facility], [place] [address], [place], [place] [address]  Basic metabolic panel     Status: Abnormal   Collection Time: 11/09/18  6:04 PM  Result Value Ref Range   Sodium 136 135 - 145 mmol/L   Potassium 5.3 (H) 3.5 - 5.1 mmol/L    Comment: SLIGHT HEMOLYSIS   Chloride 103 98 - 111 mmol/L   CO2 22 22 - 32 mmol/L   Glucose, Bld 101 (H) 70 - 99 mg/dL   BUN 33 (H) 8 - 23 mg/dL   Creatinine, Ser 1.80 (H) 0.61 - 1.24 mg/dL   Calcium 8.7 (L) 8.9 - 10.3 mg/dL   GFR calc non Af Amer 34 (L) >60 mL/min   GFR calc Af Amer 40 (L) >60 mL/min   Anion gap 11 5 - 15    Comment: Performed at [facility] [address], [place], [place] [address]  SARS Coronavirus 2 (CEPHEID - Performed in [facility] lab), Hosp Order     Status: None   Collection Time: 11/09/18  6:32 PM   Specimen: Nasopharyngeal Swab  Result Value Ref Range   SARS Coronavirus 2 NEGATIVE NEGATIVE    Comment: (NOTE) If result is NEGATIVE SARS-CoV-2 target nucleic acids are NOT DETECTED. The SARS-CoV-2 RNA is generally detectable in upper and lower  respiratory specimens during the acute phase of infection. The lowest  concentration of SARS-CoV-2 viral copies this assay can detect is 250  copies / mL. A negative result does not preclude SARS-CoV-2 infection  and should not be used as the sole basis for treatment or other  patient management decisions.  A negative result may occur with  improper specimen collection / handling, submission of specimen other  than nasopharyngeal swab, presence of viral mutation(s) within the  areas targeted by this assay, and inadequate number of viral copies  (<250 copies / mL). A negative result must be combined with clinical   observations, patient history, and epidemiological information. If result is POSITIVE SARS-CoV-2 target nucleic acids are DETECTED. The SARS-CoV-2 RNA is generally detectable in upper and lower  respiratory specimens dur ing the acute phase of infection.  Positive  results are indicative of active infection with SARS-CoV-2.  Clinical  correlation with patient history and other diagnostic information is  necessary to determine patient infection status.  Positive results do  not rule out bacterial infection or co-infection with other viruses. If result is PRESUMPTIVE POSTIVE SARS-CoV-2 nucleic acids MAY BE PRESENT.   A presumptive positive result was obtained on the submitted specimen  and confirmed on repeat testing.  While 2019 novel coronavirus  (SARS-CoV-2) nucleic acids may be present in the submitted sample  additional confirmatory testing may be necessary for epidemiological  and / or clinical management purposes  to differentiate between  SARS-CoV-2 and other Sarbecovirus currently known to infect humans.  If clinically indicated additional testing with an alternate test  methodology [phone]) is advised. The SARS-CoV-2 RNA is generally  detectable in upper and lower respiratory sp ecimens during the acute  phase of infection. The expected result is Negative. Fact Sheet for Patients:  StrictlyIdeas.no Fact Sheet for Healthcare Providers: BankingDealers.co.za This test is not yet approved or cleared by the [place] FDA and has been authorized for detection and/or diagnosis of SARS-CoV-2 by FDA under an Emergency Use Authorization (EUA).  This EUA will remain in effect (meaning this test can be used) for the duration of the COVID-19 declaration under Section  564(b)(1) of the Act, 21 U.S.C. section 360bbb-3(b)(1), unless the authorization is terminated or revoked sooner. Performed at [facility], [place] [address]  [place], [place] [address]   Troponin I (High Sensitivity)     Status: Abnormal   Collection Time: 11/09/18  6:54 PM  Result Value Ref Range   Troponin I (High Sensitivity) 4,359 (HH) <18 ng/L    Comment: CRITICAL VALUE NOTED.  VALUE IS CONSISTENT WITH PREVIOUSLY REPORTED AND CALLED VALUE. (NOTE) Elevated high sensitivity troponin I (hsTnI) values and significant  changes across serial measurements may suggest ACS but many other  chronic and acute conditions are known to elevate hsTnI results.  Refer to the Links section for chest pain algorithms and additional  guidance. Performed at [facility], [place] [address], [place],  [address]    Dg Chest 2 View  Result Date: 11/09/2018 CLINICAL DATA:  84 year old male with chest pain and shortness of breath for 1 week. EXAM: CHEST - 2 VIEW COMPARISON:  07/05/2018 and earlier. FINDINGS: Upright AP and lateral views. Stable cardiomegaly and mediastinal contours. Prior CABG. Stable tortuosity of the thoracic aorta. Other mediastinal contours are within normal limits. Visualized tracheal air column is within normal limits. Mildly lower lung volumes. Trace fluid in the major fissures today, although no layering pleural effusion is evident. No pneumothorax or consolidation. Pulmonary vascularity is increased. No acute osseous abnormality identified. Negative visible bowel gas pattern. IMPRESSION: 1. Mildly lower lung volumes with increased pulmonary vascularity and trace pleural fluid. Consider mild or developing edema. 2. Stable cardiomegaly. Electronically Signed   By: Genevie Ann M.D.   On: 11/09/2018 17:39    Pending Labs Unresulted Labs (From admission, onward)    Start     Ordered   11/10/18 0600  Troponin I (High Sensitivity)  STAT Now then every 2 hours,   R (with STAT occurrences)    Question:  Indication  Answer:  Suspect ACS   11/09/18 2020   11/10/18 0500  Heparin level (unfractionated)  Daily,   R     11/09/18 1944   11/10/18 0500   CBC  Daily,   R     11/09/18 1944   Signed and Held  Basic metabolic panel  ONCE - STAT,   STAT     Signed and Held   Signed and Held  Potassium  Now then every 4 hours,   STAT    Comments: Until normal twice.    Signed and Held   Signed and Held  Potassium  Once-Timed,   R     Signed and Held          Vitals/Pain Today's Vitals   11/09/18 1945 11/09/18 1949 11/09/18 2000 11/09/18 2015  BP: (!) 151/100  131/85 (!) 149/90  Pulse: (!) 102  85 98  Resp: 14  (!) 26 (!) 25  Temp:      TempSrc:      SpO2: 100%  100% 100%  Weight:      Height:      PainSc:  0-No pain      Isolation Precautions No active isolations  Medications Medications  sodium chloride flush (NS) 0.9 % injection 3 mL (has no administration in time range)  heparin ADULT infusion 100 units/mL (25000 units/28m sodium chloride 0.45%) (750 Units/hr Intravenous New Bag/Given 11/09/18 2003)  furosemide (LASIX) injection 40 mg (40 mg Intravenous Given 11/09/18 2013)  heparin bolus via infusion 3,500 Units (3,500 Units Intravenous Bolus from Bag 11/09/18  2006)    Mobility walks Moderate fall risk   Focused Assessments Cardiac Assessment Handoff:  Cardiac Rhythm: Normal sinus rhythm Lab Results  Component Value Date   TROPONINI 0.03 ([place]) 06/20/2017   No results found for: DDIMER Does the Patient currently have chest pain? No     R Recommendations: See Admitting Provider Note  Report given to:   Additional Notes:

## 2018-11-10 ENCOUNTER — Inpatient Hospital Stay (HOSPITAL_COMMUNITY): Payer: Medicare Other

## 2018-11-10 ENCOUNTER — Encounter (HOSPITAL_COMMUNITY): Payer: Self-pay | Admitting: Physician Assistant

## 2018-11-10 DIAGNOSIS — N183 Chronic kidney disease, stage 3 (moderate): Secondary | ICD-10-CM

## 2018-11-10 DIAGNOSIS — I340 Nonrheumatic mitral (valve) insufficiency: Secondary | ICD-10-CM

## 2018-11-10 DIAGNOSIS — F0390 Unspecified dementia without behavioral disturbance: Secondary | ICD-10-CM

## 2018-11-10 DIAGNOSIS — I10 Essential (primary) hypertension: Secondary | ICD-10-CM

## 2018-11-10 LAB — POTASSIUM
Potassium: 3.9 mmol/L (ref 3.5–5.1)
Potassium: 3.9 mmol/L (ref 3.5–5.1)
Potassium: 3.9 mmol/L (ref 3.5–5.1)
Potassium: 3.9 mmol/L (ref 3.5–5.1)

## 2018-11-10 LAB — BASIC METABOLIC PANEL
Anion gap: 11 (ref 5–15)
BUN: 33 mg/dL — ABNORMAL HIGH (ref 8–23)
CO2: 24 mmol/L (ref 22–32)
Calcium: 8.8 mg/dL — ABNORMAL LOW (ref 8.9–10.3)
Chloride: 104 mmol/L (ref 98–111)
Creatinine, Ser: 1.73 mg/dL — ABNORMAL HIGH (ref 0.61–1.24)
GFR calc Af Amer: 42 mL/min — ABNORMAL LOW (ref >60)
GFR calc non Af Amer: 36 mL/min — ABNORMAL LOW (ref >60)
Glucose, Bld: 138 mg/dL — ABNORMAL HIGH (ref 70–99)
Potassium: 3.6 mmol/L (ref 3.5–5.1)
Sodium: 139 mmol/L (ref 135–145)

## 2018-11-10 LAB — ECHOCARDIOGRAM LIMITED
Height: 63 in
Weight: 2144 oz

## 2018-11-10 LAB — HEPARIN LEVEL (UNFRACTIONATED)
Heparin Unfractionated: 0.42 IU/mL (ref 0.30–0.70)
Heparin Unfractionated: 0.39 IU/mL (ref 0.30–0.70)

## 2018-11-10 MED ORDER — IPRATROPIUM-ALBUTEROL 0.5-2.5 (3) MG/3ML IN SOLN
3.0000 mL | Freq: Four times a day (QID) | RESPIRATORY_TRACT | Status: DC
  Filled 2018-11-10 (×2): qty 3

## 2018-11-10 NOTE — Progress Notes (Addendum)
Entered in error

## 2018-11-10 NOTE — Progress Notes (Signed)
Progress Note  Patient Name: [name] Date of Encounter: 11/10/2018  Primary Cardiologist: Mertie Moores, MD   Subjective   He is feeling slightly better today.  Inpatient Medications    Scheduled Meds: . aspirin EC  81 mg Oral Daily  . ezetimibe  10 mg Oral Daily  . furosemide  40 mg Intravenous BID  . isosorbide mononitrate  30 mg Oral Daily  . sodium chloride flush  3 mL Intravenous Once  . sodium chloride flush  3 mL Intravenous Q12H   Continuous Infusions: . sodium chloride    . heparin 750 Units/hr (11/10/18 0445)   PRN Meds: sodium chloride, acetaminophen, nitroGLYCERIN, ondansetron (ZOFRAN) IV, sodium chloride flush   Vital Signs    Vitals:   11/09/18 2117 11/09/18 2147 11/10/18 0418 11/10/18 0823  BP:  (!) 143/97 (!) 124/54 140/84  Pulse:  75 (!) 101 (!) 111  Resp:  18 20 20   Temp:  (!) 97.4 F (36.3 C) 97.7 F (36.5 C) 98 F (36.7 C)  TempSrc:  Oral Oral Oral  SpO2:  98% 100% 100%  Weight: 60.5 kg  60.8 kg   Height: 5\' 3"  (1.6 m)       Intake/Output Summary (Last 24 hours) at 11/10/2018 1059 Last data filed at 11/10/2018 0900 Gross per 24 hour  Intake 240 ml  Output 1500 ml  Net -1260 ml   Last 3 Weights 11/10/2018 11/09/2018 11/09/2018  Weight (lbs) 134 lb 133 lb 4.8 oz 134 lb 7.7 oz  Weight (kg) 60.782 kg 60.464 kg 61 kg      Telemetry    SR to ST - Personally Reviewed  ECG    No new tracing - Personally Reviewed  Physical Exam  GEN: No acute distress.   Neck: No JVD Cardiac: RRR, no murmurs, rubs, or gallops.  Respiratory: minimal crackles to auscultation bilaterally. GI: Soft, nontender, non-distended  MS: No edema; No deformity. Neuro:  Nonfocal  Psych: Normal affect   Labs    High Sensitivity Troponin:   Recent Labs  Lab 11/09/18 1654 11/09/18 1854  TROPONINIHS 4,554* 4,359*      Cardiac EnzymesNo results for input(s): TROPONINI in the last 168 hours. No results for input(s): TROPIPOC in the last 168 hours.    Chemistry Recent Labs  Lab 11/09/18 1654 11/09/18 1804 11/10/18 0141 11/10/18 0344  NA 135 136 139  --   K 6.4* 5.3* 3.6 3.9  CL 103 103 104  --   CO2 23 22 24   --   GLUCOSE 108* 101* 138*  --   BUN 33* 33* 33*  --   CREATININE 1.81* 1.80* 1.73*  --   CALCIUM 8.7* 8.7* 8.8*  --   GFRNONAA 34* 34* 36*  --   GFRAA 39* 40* 42*  --   ANIONGAP 9 11 11   --      Hematology Recent Labs  Lab 11/09/18 1654  WBC 6.2  RBC 4.41  HGB 12.5*  HCT 38.4*  MCV 87.1  MCH 28.3  MCHC 32.6  RDW 15.0  PLT 183    BNP Recent Labs  Lab 11/09/18 1804  BNP 1,709.4*     DDimer No results for input(s): DDIMER in the last 168 hours.   Radiology    Dg Chest 2 View  Result Date: 11/09/2018 CLINICAL DATA:  84 year old male with chest pain and shortness of breath for 1 week. EXAM: CHEST - 2 VIEW COMPARISON:  07/05/2018 and earlier. FINDINGS: Upright AP and lateral views.  Stable cardiomegaly and mediastinal contours. Prior CABG. Stable tortuosity of the thoracic aorta. Other mediastinal contours are within normal limits. Visualized tracheal air column is within normal limits. Mildly lower lung volumes. Trace fluid in the major fissures today, although no layering pleural effusion is evident. No pneumothorax or consolidation. Pulmonary vascularity is increased. No acute osseous abnormality identified. Negative visible bowel gas pattern. IMPRESSION: 1. Mildly lower lung volumes with increased pulmonary vascularity and trace pleural fluid. Consider mild or developing edema. 2. Stable cardiomegaly. Electronically Signed   By: Genevie Ann M.D.   On: 11/09/2018 17:39    Cardiac Studies     Patient Profile     84 y.o. male  with a hx of CABG 2003, AI, S-D-CHF, CKD, HTN, HLD intol statins w/ elevated LFTs and CK, carotid dz, hx med non-compliance, NSTEMI 2016 w/ DES LAD & SVG-OM, who is being seen today for the evaluation of chest pain and elevated troponin.  The patient appears confused but are able to  obtain some history from him and some from his wife.  Apparently he has been doing well until recently when he developed on and off chest pain that are nonexertional, they are retrosternal pressure-like, he also felt some exertional shortness of breath.  Patient is worried that these medications were exchanging pharmacy however this cannot be confirmed.  He is currently chest pain-free and feels comfortable.  Has not had any recent lower extremity edema no fever chills nausea or vomiting.  Assessment & Plan    Acute on chronic combined systolic diastolic CHF Non-STEMI CAD status post CABG Hypertension Hyperlipidemia with intolerance to statins Acute on chronic kidney failure CKD stage IV  He is mildly improved, no chest pain, negative 1.2 L overnight, Crea is stable, weight down 1 kg. Physical exam improved. Troponin 4500 -> 4300.  BNP 12,000.  Potassium 5.3->3.9. We will continue IV diuresis with Lasix 40 mg twice daily, we will follow creatinine and electrolytes closely, we will continue heparin drip for now , will obtain limited echocardiogram for evaluation of LVEF and wall motion abnormalities.   For now we will hold off with cardiac catheterization, CKD stage 4, discussed possibility of contrast induced nephropathy and risk of HD, he would want me to discuss with his wife.   For questions or updates, please contact [place] Please consult [email or web address] for contact info under     Signed, Ena Dawley, MD  11/10/2018, 10:59 AM

## 2018-11-10 NOTE — Progress Notes (Addendum)
[place] for heparin  Indication: chest pain/ACS  Allergies  Allergen Reactions  . Ramipril Swelling and Other (See Comments)    Angioedema   . Statins Other (See Comments)    Elevated LFTs with statins in the past  . Latex Itching and Rash    Patient Measurements: Height: _0  (160 cm) Weight: 134 lb (60.8 kg)(scale b) IBW/kg (Calculated) : 56.9 Heparin Dosing Weight: TBW  Vital Signs: Temp: 97.7 F (36.5 C) (07/01 0418) Temp Source: Oral (07/01 0418) BP: 124/54 (07/01 0418) Pulse Rate: 101 (07/01 0418)  Labs: Recent Labs    11/09/18 1654 11/09/18 1804 11/09/18 1854 11/10/18 0141 11/10/18 0344  HGB 12.5*  --   --   --   --   HCT 38.4*  --   --   --   --   PLT 183  --   --   --   --   LABPROT 15.1  --   --   --   --   INR 1.2  --   --   --   --   HEPARINUNFRC  --   --   --   --  0.42  CREATININE 1.81* 1.80*  --  1.73*  --   TROPONINIHS 4,554*  --  4,359*  --   --     Estimated Creatinine Clearance: 26.5 mL/min (A) (by C-G formula based on SCr of 1.73 mg/dL (H)).   Medical History: Past Medical History:  Diagnosis Date  . CAD (coronary artery disease)    a. CABG (2003)  b. 9/17/6: NSTEMI s/p overlapping DES x2 to LAD and DESx1 to SVG-->OM   . Carotid artery disease ([place])    a. 09/2013: carotid dopplers with stable 1-39% ICA stenosis bilaterally  //  b. Carotid US 7/42: RICA 59-56%; LICA 3-87% >> FU 1 year // c. Carotid US 09/2016: Bilateral ICA 1-39 >> follow-up 1 year  //  d. Carotid US 5/19: Bilateral ICA 1-39; decreased flow in right subclavian artery   . CHF (congestive heart failure) ([place])   . Dementia ([place])   . History of echocardiogram    Echo 4/18: mild conc LVH, EF 35-40, diff HK, anteroseptal and inf-septal HK, Gr 2 DD, mild AS (mean 13), mod AI, mildly dilated asc Ao (43 mm), mod MAC, mod MR, severe LAE, mild TR, mod PI, PASP 43  . Hyperlipidemia   . Hypertension   . Internal hemorrhoids   . Ischemic  cardiomyopathy    a. 2D ECHO 01/28/15 w/ EF 25-30%, severe HK of the basal-midinferoseptal myocardium, basal-midinferior myocardium and midanteroseptal myocardium. Akinesis of apical inferior and apicalanterior myocardium. G2DD. Mild AS, Asc aorta diameter 84m. Mild dilation of aortic root. Mild MR, mild PR and PA peak pressure 36 mmg HG.  . Peptic ulcer disease   . Prostate CA ([place]    a. s/p seed implants    Assessment: [id number] presenting with CP.  Chronic anemia stable, plts 183, no anticoagulation PTA.    Heparin level this morning came back therapeutic at 0.42, on 750 units/hr. No s/sx of bleeding. No infusion issues.   Goal of Therapy:  Heparin level 0.3-0.7 units/ml Monitor platelets by anticoagulation protocol: Yes   Plan:  Continue heparin infusion at 750 units/hr Get confirmatory level in 8 hr Monitor daily HL, CBC, and for s/sx of bleeding  KAntonietta Jewel PharmD, BCCCP Clinical Pharmacist  Pager: [phone]: [phone] check AMION for all M[place]numbers 11/10/2018  8:02 AM  ADDENDUM Heparin level this afternoon came back therapeutic at 0.39, on 750 units/hr. Continue at same rate and monitor levels with daily labs. No issues or bleeding per nursing.   Antonietta Jewel, PharmD, [place] Clinical Pharmacist

## 2018-11-10 NOTE — Progress Notes (Signed)
PROGRESS NOTE    [name]  SHF:[id number] DOB: [birth year] DOA: 11/09/2018 PCP: Antony Contras, MD    Brief Narrative:  84 year old male with history of dementia, chronic combined CHF, chronic kidney disease stage III, coronary artery disease status post CABG, admitted to the hospital with chest discomfort.  Found to have elevated troponin and evidence of decompensated CHF.  Started on intravenous heparin infusion and intravenous diuresis.  Cardiology following to determine if cardiac catheterization will be necessary.  He is currently undergoing IV diuresis.   Assessment & Plan:   Principal Problem:   NSTEMI (non-ST elevated myocardial infarction) ([place]) Active Problems:   Essential hypertension   Dementia (HCC)   Chronic kidney disease, stage 3 (HCC)   Acute on chronic combined systolic and diastolic CHF (congestive heart failure) (HCC)   Hyperkalemia   1. NSTEMI.  No further complaints of chest pain.  Cardiology following.  Currently on heparin infusion.  Use sublingual nitroglycerin as needed. 2. Coronary artery disease status post CABG.  Cardiology following to assess whether cardiac catheterization would be appropriate. 3. Acute on chronic combined CHF.  Ejection fraction of 30% per echocardiogram.  Wall motion abnormalities noted.  Currently on intravenous Lasix for diuresis.  Still has some evidence of volume overload. 4. Chronic kidney disease stage III.  Creatinine is currently at baseline.  Continue to monitor in the setting of diuresis 5. Hyperkalemia.  Improved with diuresis 6. Dementia.  Continue supportive care   DVT prophylaxis: heparin infusion Code Status: full code Family Communication: discussed with wife Disposition Plan: discharge home once adequately diuresed and no further cardiac work up planned   Consultants:   cardiology  Procedures:  Echo: The left ventricle had a visually estimated ejection fraction of of 30%. The cavity size was mildly dilated.  There is mildly increased left ventricular wall thickness. Indeterminant diastolic function. Severe hypokinesis of the inferior and  inferolateral walls, akinesis of the apex.  2. The mitral valve is degenerative. Mild calcification of the mitral valve leaflet. There is mild mitral annular calcification present. Mitral valve regurgitation is mild to moderate by color flow Doppler. No evidence of mitral valve stenosis.  3. The aortic valve is tricuspid Severe calcifcation of the aortic valve. Aortic valve regurgitation is mild by color flow Doppler. Visually, there appear to be at least moderate aortic stenosis but mean gradient only 9 mmHg. Possible low gradient  moderate or severe aortic stenosis. Low dose dobutamine stress echo may help sort this out.   4. The IVC was normal in size. No complete TR doppler jet so unable to estimate PA systolic pressure.  Antimicrobials:       Subjective: No chest pain. He is mildly confused.  Objective: Vitals:   11/10/18 0418 11/10/18 0823 11/10/18 1224 11/10/18 1645  BP: (!) 124/54 140/84 124/79 119/61  Pulse: (!) 101 (!) 111 88 67  Resp: 20 20 20 18   Temp: 97.7 F (36.5 C) 98 F (36.7 C) 98.4 F (36.9 C)   TempSrc: Oral Oral Oral   SpO2: 100% 100% 99%   Weight: 60.8 kg     Height:        Intake/Output Summary (Last 24 hours) at 11/10/2018 2018 Last data filed at 11/10/2018 1500 Gross per 24 hour  Intake 382 ml  Output 1800 ml  Net -1418 ml   Filed Weights   11/09/18 1650 11/09/18 2117 11/10/18 0418  Weight: 61 kg 60.5 kg 60.8 kg    Examination:  General exam: Appears calm and  comfortable  Respiratory system: Crackles at bases. Increased respiratory effort with conversation. Cardiovascular system: S1 & S2 heard, RRR. No JVD, murmurs, rubs, gallops or clicks. 1+ pedal edema. Gastrointestinal system: Abdomen is nondistended, soft and nontender. No organomegaly or masses felt. Normal bowel sounds heard. Central nervous system: Alert and  oriented. No focal neurological deficits. Extremities: Symmetric 5 x 5 power. Skin: No rashes, lesions or ulcers Psychiatry: Judgement and insight appear normal. Mood & affect appropriate.     Data Reviewed: I have personally reviewed following labs and imaging studies  CBC: Recent Labs  Lab 11/09/18 1654  WBC 6.2  HGB 12.5*  HCT 38.4*  MCV 87.1  PLT 500   Basic Metabolic Panel: Recent Labs  Lab 11/09/18 1654 11/09/18 1804 11/10/18 0141 11/10/18 0344 11/10/18 1125 11/10/18 1444 11/10/18 1811  NA 135 136 139  --   --   --   --   K 6.4* 5.3* 3.6 3.9 3.9 3.9 3.9  CL 103 103 104  --   --   --   --   CO2 23 22 24   --   --   --   --   GLUCOSE 108* 101* 138*  --   --   --   --   BUN 33* 33* 33*  --   --   --   --   CREATININE 1.81* 1.80* 1.73*  --   --   --   --   CALCIUM 8.7* 8.7* 8.8*  --   --   --   --    GFR: Estimated Creatinine Clearance: 26.5 mL/min (A) (by C-G formula based on SCr of 1.73 mg/dL (H)). Liver Function Tests: No results for input(s): AST, ALT, ALKPHOS, BILITOT, PROT, ALBUMIN in the last 168 hours. No results for input(s): LIPASE, AMYLASE in the last 168 hours. No results for input(s): AMMONIA in the last 168 hours. Coagulation Profile: Recent Labs  Lab 11/09/18 1654  INR 1.2   Cardiac Enzymes: No results for input(s): CKTOTAL, CKMB, CKMBINDEX, TROPONINI in the last 168 hours. BNP (last 3 results) Recent Labs    10/05/18 1555  PROBNP 12,132*   HbA1C: No results for input(s): HGBA1C in the last 72 hours. CBG: No results for input(s): GLUCAP in the last 168 hours. Lipid Profile: No results for input(s): CHOL, HDL, LDLCALC, TRIG, CHOLHDL, LDLDIRECT in the last 72 hours. Thyroid Function Tests: No results for input(s): TSH, T4TOTAL, FREET4, T3FREE, THYROIDAB in the last 72 hours. Anemia Panel: No results for input(s): VITAMINB12, FOLATE, FERRITIN, TIBC, IRON, RETICCTPCT in the last 72 hours. Sepsis Labs: No results for input(s):  PROCALCITON, LATICACIDVEN in the last 168 hours.  Recent Results (from the past 240 hour(s))  SARS Coronavirus 2 (CEPHEID - Performed in [place] hospital lab), Hosp Order     Status: None   Collection Time: 11/09/18  6:32 PM   Specimen: Nasopharyngeal Swab  Result Value Ref Range Status   SARS Coronavirus 2 NEGATIVE NEGATIVE Final    Comment: (NOTE) If result is NEGATIVE SARS-CoV-2 target nucleic acids are NOT DETECTED. The SARS-CoV-2 RNA is generally detectable in upper and lower  respiratory specimens during the acute phase of infection. The lowest  concentration of SARS-CoV-2 viral copies this assay can detect is 250  copies / mL. A negative result does not preclude SARS-CoV-2 infection  and should not be used as the sole basis for treatment or other  patient management decisions.  A negative result may occur with  improper  specimen collection / handling, submission of specimen other  than nasopharyngeal swab, presence of viral mutation(s) within the  areas targeted by this assay, and inadequate number of viral copies  (<250 copies / mL). A negative result must be combined with clinical  observations, patient history, and epidemiological information. If result is POSITIVE SARS-CoV-2 target nucleic acids are DETECTED. The SARS-CoV-2 RNA is generally detectable in upper and lower  respiratory specimens dur ing the acute phase of infection.  Positive  results are indicative of active infection with SARS-CoV-2.  Clinical  correlation with patient history and other diagnostic information is  necessary to determine patient infection status.  Positive results do  not rule out bacterial infection or co-infection with other viruses. If result is PRESUMPTIVE POSTIVE SARS-CoV-2 nucleic acids MAY BE PRESENT.   A presumptive positive result was obtained on the submitted specimen  and confirmed on repeat testing.  While 2019 novel coronavirus  (SARS-CoV-2) nucleic acids may be present in  the submitted sample  additional confirmatory testing may be necessary for epidemiological  and / or clinical management purposes  to differentiate between  SARS-CoV-2 and other Sarbecovirus currently known to infect humans.  If clinically indicated additional testing with an alternate test  methodology [phone]) is advised. The SARS-CoV-2 RNA is generally  detectable in upper and lower respiratory sp ecimens during the acute  phase of infection. The expected result is Negative. Fact Sheet for Patients:  StrictlyIdeas.no Fact Sheet for Healthcare Providers: BankingDealers.co.za This test is not yet approved or cleared by the [place] FDA and has been authorized for detection and/or diagnosis of SARS-CoV-2 by FDA under an Emergency Use Authorization (EUA).  This EUA will remain in effect (meaning this test can be used) for the duration of the COVID-19 declaration under Section 564(b)(1) of the Act, 21 U.S.C. section 360bbb-3(b)(1), unless the authorization is terminated or revoked sooner. Performed at [facility], [place] [address] [place], [place] [address]          Radiology Studies: Dg Chest 2 View  Result Date: 11/09/2018 CLINICAL DATA:  84 year old male with chest pain and shortness of breath for 1 week. EXAM: CHEST - 2 VIEW COMPARISON:  07/05/2018 and earlier. FINDINGS: Upright AP and lateral views. Stable cardiomegaly and mediastinal contours. Prior CABG. Stable tortuosity of the thoracic aorta. Other mediastinal contours are within normal limits. Visualized tracheal air column is within normal limits. Mildly lower lung volumes. Trace fluid in the major fissures today, although no layering pleural effusion is evident. No pneumothorax or consolidation. Pulmonary vascularity is increased. No acute osseous abnormality identified. Negative visible bowel gas pattern. IMPRESSION: 1. Mildly lower lung volumes with increased  pulmonary vascularity and trace pleural fluid. Consider mild or developing edema. 2. Stable cardiomegaly. Electronically Signed   By: Genevie Ann M.D.   On: 11/09/2018 17:39        Scheduled Meds: . aspirin EC  81 mg Oral Daily  . ezetimibe  10 mg Oral Daily  . furosemide  40 mg Intravenous BID  . ipratropium-albuterol  3 mL Nebulization Q6H  . isosorbide mononitrate  30 mg Oral Daily  . sodium chloride flush  3 mL Intravenous Once  . sodium chloride flush  3 mL Intravenous Q12H   Continuous Infusions: . sodium chloride    . heparin 750 Units/hr (11/10/18 0445)     LOS: 1 day    Time spent: 66mins    Kathie Dike, MD [facility]   If 7PM-7AM, please contact  night-coverage [email or web address]  11/10/2018, 8:18 PM

## 2018-11-10 NOTE — Progress Notes (Signed)
Pt comlained of 4/10 CP. EKG and VS done. Pt states no pain afterwards. Will continue to monitor

## 2018-11-10 NOTE — Progress Notes (Addendum)
Writer notified by lab that patient refused to have labs taken until speaking with someone. Writer advised patient of reasoning for labs , pt insists that he does not want labs drawn and is "not happy." Pt was speaking to wife about leaving hospital,writer talked to patients wife who stated that the patient was sent here against his will. She was told by primary that his labs needed to be taken but was never told that the patient needed to be admitted to the hospital. Writer was unable to responds to wife's question as wife began yelling and hung up phone. Provider notified.

## 2018-11-10 NOTE — Progress Notes (Signed)
  Echocardiogram 2D Echocardiogram has been performed.  [name] 11/10/2018, 12:03 PM

## 2018-11-10 NOTE — Progress Notes (Signed)
Pt has refused to bed alarm on. Educated pt on rationale for bed alarm. Bed alarm deactivated.

## 2018-11-11 DIAGNOSIS — E875 Hyperkalemia: Secondary | ICD-10-CM

## 2018-11-11 LAB — BASIC METABOLIC PANEL
Anion gap: 14 (ref 5–15)
BUN: 35 mg/dL — ABNORMAL HIGH (ref 8–23)
CO2: 25 mmol/L (ref 22–32)
Calcium: 9.4 mg/dL (ref 8.9–10.3)
Chloride: 101 mmol/L (ref 98–111)
Creatinine, Ser: 1.76 mg/dL — ABNORMAL HIGH (ref 0.61–1.24)
GFR calc Af Amer: 41 mL/min — ABNORMAL LOW (ref >60)
GFR calc non Af Amer: 35 mL/min — ABNORMAL LOW (ref >60)
Glucose, Bld: 97 mg/dL (ref 70–99)
Potassium: 3.8 mmol/L (ref 3.5–5.1)
Sodium: 140 mmol/L (ref 135–145)

## 2018-11-11 LAB — CBC
HCT: 43.6 % (ref 39.0–52.0)
Hemoglobin: 14 g/dL (ref 13.0–17.0)
MCH: 28.3 pg (ref 26.0–34.0)
MCHC: 32.1 g/dL (ref 30.0–36.0)
MCV: 88.3 fL (ref 80.0–100.0)
Platelets: 185 10*3/uL (ref 150–400)
RBC: 4.94 MIL/uL (ref 4.22–5.81)
RDW: 14.6 % (ref 11.5–15.5)
WBC: 6.4 10*3/uL (ref 4.0–10.5)
nRBC: 0 % (ref 0.0–0.2)

## 2018-11-11 LAB — HEPARIN LEVEL (UNFRACTIONATED): Heparin Unfractionated: 0.27 IU/mL — ABNORMAL LOW (ref 0.30–0.70)

## 2018-11-11 MED ORDER — IPRATROPIUM-ALBUTEROL 0.5-2.5 (3) MG/3ML IN SOLN: 3.0000 mL | Freq: Four times a day (QID) | RESPIRATORY_TRACT | Status: DC | PRN

## 2018-11-11 MED ORDER — FUROSEMIDE 40 MG PO TABS: 40.0000 mg | ORAL_TABLET | Freq: Two times a day (BID) | ORAL | Status: DC

## 2018-11-11 MED ORDER — IPRATROPIUM-ALBUTEROL 0.5-2.5 (3) MG/3ML IN SOLN
3.0000 mL | Freq: Three times a day (TID) | RESPIRATORY_TRACT | Status: DC
  Administered 2018-11-11: 3 mL via RESPIRATORY_TRACT
  Filled 2018-11-11: qty 3

## 2018-11-11 MED ORDER — FUROSEMIDE 40 MG PO TABS: 40.0000 mg | ORAL_TABLET | Freq: Two times a day (BID) | ORAL | 3 refills | Status: AC

## 2018-11-11 NOTE — Progress Notes (Signed)
[facility] for heparin  Indication: chest pain/ACS  Allergies  Allergen Reactions  . Ramipril Swelling and Other (See Comments)    Angioedema   . Statins Other (See Comments)    Elevated LFTs with statins in the past  . Latex Itching and Rash  . Coreg [Carvedilol] Itching and Other (See Comments)    CANNOT TAKE MYLAN BRAND due to fillers (patient uses [employer]uro[employer] Brand) Hallucinations Altered mental status    Patient Measurements: Height: _0  (160 cm) Weight: 129 lb 3.2 oz (58.6 kg)(scale b) IBW/kg (Calculated) : 56.9 Heparin Dosing Weight: TBW  Vital Signs: Temp: 97.6 F (36.4 C) (07/02 0510) Temp Source: Oral (07/02 0510) BP: 148/91 (07/02 0510) Pulse Rate: 86 (07/02 0510)  Labs: Recent Labs    11/09/18 1654 11/09/18 1804 11/09/18 1854 11/10/18 0141 11/10/18 0344 11/10/18 1125 11/11/18 0350  HGB 12.5*  --   --   --   --   --   --   HCT 38.4*  --   --   --   --   --   --   PLT 183  --   --   --   --   --   --   LABPROT 15.1  --   --   --   --   --   --   INR 1.2  --   --   --   --   --   --   HEPARINUNFRC  --   --   --   --  0.42 0.39 0.27*  CREATININE 1.81* 1.80*  --  1.73*  --   --  1.76*  TROPONINIHS 4,554*  --  4,359*  --   --   --   --     Estimated Creatinine Clearance: 26 mL/min (A) (by C-G formula based on SCr of 1.76 mg/dL (H)).   Medical History: Past Medical History:  Diagnosis Date  . CAD (coronary artery disease)    a. CABG (2003)  b. 9/17/6: NSTEMI s/p overlapping DES x2 to LAD and DESx1 to SVG-->OM   . Carotid artery disease ([place])    a. 09/2013: carotid dopplers with stable 1-39% ICA stenosis bilaterally  //  b. Carotid US 2/84: RICA 13-24%; LICA 4-01% >> FU 1 year // c. Carotid US 09/2016: Bilateral ICA 1-39 >> follow-up 1 year  //  d. Carotid US 5/19: Bilateral ICA 1-39; decreased flow in right subclavian artery   . CHF (congestive heart failure) ([place])   . Chronic kidney disease (CKD), stage III  (moderate) (HCC)   . Dementia ([place])   . History of echocardiogram    Echo 4/18: mild conc LVH, EF 35-40, diff HK, anteroseptal and inf-septal HK, Gr 2 DD, mild AS (mean 13), mod AI, mildly dilated asc Ao (43 mm), mod MAC, mod MR, severe LAE, mild TR, mod PI, PASP 43  . Hyperlipidemia   . Hypertension   . Internal hemorrhoids   . Ischemic cardiomyopathy    a. 2D ECHO 01/28/15 w/ EF 25-30%, severe HK of the basal-midinferoseptal myocardium, basal-midinferior myocardium and midanteroseptal myocardium. Akinesis of apical inferior and apicalanterior myocardium. G2DD. Mild AS, Asc aorta diameter 68m. Mild dilation of aortic root. Mild MR, mild PR and PA peak pressure 36 mmg HG.  . Peptic ulcer disease   . Prostate CA ([place]    a. s/p seed implants    Assessment: [id number] presenting with CP.  Chronic anemia stable, plts 183, no  anticoagulation PTA.    Heparin level this morning came back therapeutic at 0.27, on 750 units/hr. No s/sx of bleeding. No infusion issues.   Goal of Therapy:  Heparin level 0.3-0.7 units/ml Monitor platelets by anticoagulation protocol: Yes   Plan:  Increase heparin infusion to 850 units/hr Monitor daily HL, CBC, and for s/sx of bleeding  Antonietta Jewel, PharmD, BCCCP Clinical Pharmacist  Pager: [phone] Phone: [phone] Please check AMION for all [place] numbers 11/11/2018 8:09 AM

## 2018-11-11 NOTE — Care Management Important Message (Signed)
Important Message  Patient Details  Name: [name] MRN: [id number] Date of Birth: February 07, 1935   Medicare Important Message Given:  Yes     [name] 11/11/2018, 2:22 PM

## 2018-11-11 NOTE — Progress Notes (Signed)
Progress Note  Patient Name: Lanell Dubie Date of Encounter: 11/11/2018  Primary Cardiologist: Mertie Moores, MD   Subjective   He appears somnolent today.  Inpatient Medications    Scheduled Meds: . aspirin EC  81 mg Oral Daily  . ezetimibe  10 mg Oral Daily  . furosemide  40 mg Intravenous BID  . ipratropium-albuterol  3 mL Nebulization TID  . isosorbide mononitrate  30 mg Oral Daily  . sodium chloride flush  3 mL Intravenous Once  . sodium chloride flush  3 mL Intravenous Q12H   Continuous Infusions: . sodium chloride    . heparin 850 Units/hr (11/11/18 0903)   PRN Meds: sodium chloride, acetaminophen, nitroGLYCERIN, ondansetron (ZOFRAN) IV, sodium chloride flush   Vital Signs    Vitals:   11/10/18 1645 11/10/18 2040 11/11/18 0510 11/11/18 0907  BP: 119/61 (!) 127/91 (!) 148/91 131/82  Pulse: 67 86 86 100  Resp: 18 19 (!) 21 18  Temp:  97.6 F (36.4 C) 97.6 F (36.4 C)   TempSrc:  Oral Oral   SpO2:  97% 100%   Weight:   58.6 kg   Height:        Intake/Output Summary (Last 24 hours) at 11/11/2018 1028 Last data filed at 11/11/2018 0903 Gross per 24 hour  Intake 342 ml  Output 500 ml  Net -158 ml   Last 3 Weights 11/11/2018 11/10/2018 11/09/2018  Weight (lbs) 129 lb 3.2 oz 134 lb 133 lb 4.8 oz  Weight (kg) 58.605 kg 60.782 kg 60.464 kg      Telemetry    SR to ST - Personally Reviewed  ECG    No new tracing - Personally Reviewed  Physical Exam  GEN: No acute distress.   Neck: No JVD Cardiac: RRR, no murmurs, rubs, or gallops.  Respiratory: minimal crackles to auscultation bilaterally. GI: Soft, nontender, non-distended  MS: No edema; No deformity. Neuro:  Nonfocal  Psych: Normal affect   Labs    High Sensitivity Troponin:   Recent Labs  Lab 11/09/18 1654 11/09/18 1854  TROPONINIHS 4,554* 4,359*      Cardiac EnzymesNo results for input(s): TROPONINI in the last 168 hours. No results for input(s): TROPIPOC in the last 168 hours.    Chemistry Recent Labs  Lab 11/09/18 1804 11/10/18 0141  11/10/18 1444 11/10/18 1811 11/11/18 0350  NA 136 139  --   --   --  140  K 5.3* 3.6   < > 3.9 3.9 3.8  CL 103 104  --   --   --  101  CO2 22 24  --   --   --  25  GLUCOSE 101* 138*  --   --   --  97  BUN 33* 33*  --   --   --  35*  CREATININE 1.80* 1.73*  --   --   --  1.76*  CALCIUM 8.7* 8.8*  --   --   --  9.4  GFRNONAA 34* 36*  --   --   --  35*  GFRAA 40* 42*  --   --   --  41*  ANIONGAP 11 11  --   --   --  14   < > = values in this interval not displayed.     Hematology Recent Labs  Lab 11/09/18 1654 11/11/18 0810  WBC 6.2 6.4  RBC 4.41 4.94  HGB 12.5* 14.0  HCT 38.4* 43.6  MCV 87.1 88.3  MCH  28.3 28.3  MCHC 32.6 32.1  RDW 15.0 14.6  PLT 183 185    BNP Recent Labs  Lab 11/09/18 1804  BNP 1,709.4*     DDimer No results for input(s): DDIMER in the last 168 hours.   Radiology    Dg Chest 2 View  Result Date: 11/09/2018 CLINICAL DATA:  84 year old male with chest pain and shortness of breath for 1 week. EXAM: CHEST - 2 VIEW COMPARISON:  07/05/2018 and earlier. FINDINGS: Upright AP and lateral views. Stable cardiomegaly and mediastinal contours. Prior CABG. Stable tortuosity of the thoracic aorta. Other mediastinal contours are within normal limits. Visualized tracheal air column is within normal limits. Mildly lower lung volumes. Trace fluid in the major fissures today, although no layering pleural effusion is evident. No pneumothorax or consolidation. Pulmonary vascularity is increased. No acute osseous abnormality identified. Negative visible bowel gas pattern. IMPRESSION: 1. Mildly lower lung volumes with increased pulmonary vascularity and trace pleural fluid. Consider mild or developing edema. 2. Stable cardiomegaly. Electronically Signed   By: Genevie Ann M.D.   On: 11/09/2018 17:39    Cardiac Studies     Patient Profile     84 y.o. male  with a hx of CABG 2003, AI, S-D-CHF, CKD, HTN, HLD intol  statins w/ elevated LFTs and CK, carotid dz, hx med non-compliance, NSTEMI 2016 w/ DES LAD & SVG-OM, who is being seen today for the evaluation of chest pain and elevated troponin.  The patient appears confused but are able to obtain some history from him and some from his wife.  Apparently he has been doing well until recently when he developed on and off chest pain that are nonexertional, they are retrosternal pressure-like, he also felt some exertional shortness of breath.  Patient is worried that these medications were exchanging pharmacy however this cannot be confirmed.  He is currently chest pain-free and feels comfortable.  Has not had any recent lower extremity edema no fever chills nausea or vomiting.  Assessment & Plan    Acute on chronic combined systolic diastolic CHF Non-STEMI CAD status post CABG Hypertension Hyperlipidemia with intolerance to statins Acute on chronic kidney failure CKD stage IV  He is mildly improved, no chest pain, negative 1.2 L overnight, Crea is stable, weight down 2 kg. Physical exam improved. Troponin 4500 -> 4300.  BNP 12,000.  Potassium 5.3->3.8. We will continue IV diuresis with Lasix 40 mg twice daily, today's crea is pending.  I have had a long discussion with his wife, he hasn't been having exertional chest pain lately. He has advanced dementia and also has stage 4 kidney disease. She would absolutely not like him being on HD.  We would continue medical therapy, he is now asymptomatic and euvolemic, I would switch to PO lasix 40 mg BID. He can be discharged today, we will arrange for an outpatient follow up.  For questions or updates, please contact [place] Please consult [email or web address] for contact info under     Signed, Ena Dawley, MD  11/11/2018, 10:28 AM

## 2018-11-11 NOTE — Progress Notes (Addendum)
Pt refuses to eat breakfast tray. RN offered 3x. Pt still refuses. Will continue to monitor.   1233 Pt still refuses to eat lunch tray. MD notified.

## 2018-11-11 NOTE — Discharge Summary (Signed)
Physician Discharge Summary  [name] WUJ:[id number] DOB: 19-May-1934 DOA: 11/09/2018  PCP: Antony Contras, MD  Admit date: 11/09/2018 Discharge date: 11/11/2018  Admitted From: home Disposition:  home  Recommendations for Outpatient Follow-up:  1. Follow up with PCP in 1-2 weeks 2. Please obtain BMP/CBC in one week 3. Follow up with cardiology will be scheduled by cardiology team.  Home Health: home health RN Equipment/Devices:  Discharge Condition:stable CODE STATUS:full code Diet recommendation: heart healthy  Brief/Interim Summary: 84 y/o male with multiple medical problems including chronic combined CHF, CKD 3, CAD s/p CABG, was admitted to the hospital with chest discomfort and shortness of breath. He was found to have elevated troponin and evidence of decompensated CHF. He was started on intravenous diuresis for chf exacerbation and IV heparin for NSTEMI. He was followed by cardiology during his hospital stay  Discharge Diagnoses:  Principal Problem:   NSTEMI (non-ST elevated myocardial infarction) [facility]) Active Problems:   Essential hypertension   Dementia ([place])   Chronic kidney disease, stage 3 (HCC)   Acute on chronic combined systolic and diastolic CHF (congestive heart failure) (HCC)   Hyperkalemia  1. NSTEMI.  No further complaints of chest pain.  Cardiology following. He was treated with heparin for 48hrs. He is on aspirin, BB, imdur and sublingual nitroglycerin prn 2. Coronary artery disease status post CABG.  after cardiology discussed case with patient's wife, it was decided not to pursue cardiac cath at this time due to risk of renal injury. Patient was managed medically. 3. Acute on chronic combined CHF.  Ejection fraction of 30% per echocardiogram.  Wall motion abnormalities noted. Treated with intravenous lasix and overall volume status has improved. He has been transitioned to oral lasix and will follow up with cardiology for further management. HHRN arranged to  help with compliance. 4. Chronic kidney disease stage III.  Creatinine is currently at baseline.  Continue to monitor in the setting of diuresis 5. Hyperkalemia.  Improved with diuresis 6. Dementia.  Continue supportive care  Discharge Instructions  Discharge Instructions    Diet - low sodium heart healthy   Complete by: As directed    Increase activity slowly   Complete by: As directed      Allergies as of 11/11/2018      Reactions   Ramipril Swelling, Other (See Comments)   Angioedema   Statins Other (See Comments)   Elevated LFTs with statins in the past   Latex Itching, Rash   Coreg [carvedilol] Itching, Other (See Comments)   CANNOT TAKE MYLAN BRAND due to fillers (patient uses Aurobind[employer] Brand) Hallucinations Altered mental status      Medication List    TAKE these medications   aspirin EC 81 MG tablet Take 1 tablet (81 mg total) by mouth daily.   carvedilol 6.25 MG tablet Commonly known as: COREG Take 6.25 mg by mouth 2 (two) times daily with a meal.   Claritin 10 MG tablet Generic drug: loratadine Take 10 mg by mouth daily.   ezetimibe 10 MG tablet Commonly known as: ZETIA Take 1 tablet (10 mg total) by mouth daily.   furosemide 40 MG tablet Commonly known as: LASIX Take 1 tablet (40 mg total) by mouth 2 (two) times daily. What changed: when to take this   isosorbide mononitrate 30 MG 24 hr tablet Commonly known as: IMDUR Take 1 tablet (30 mg total) by mouth daily.   nitroGLYCERIN 0.4 MG SL tablet Commonly known as: NITROSTAT place 1 tablet under the tongue if  needed every 5 minutes for chest pain  UP TO 3 DOSES What changed:   how much to take  how to take this  when to take this  reasons to take this  additional instructions      Follow-up Information    [name] T, PA-C Follow up.   Specialties: Cardiology, Physician Assistant Why: they will call you with an appointment Contact information: [address] [address] [phone]        Antony Contras, MD.   Specialty: Family Medicine Why: GO: July 14 at 3:15pm Contact information: [place] [address] [phone]          Allergies  Allergen Reactions  . Ramipril Swelling and Other (See Comments)    Angioedema   . Statins Other (See Comments)    Elevated LFTs with statins in the past  . Latex Itching and Rash  . Coreg [Carvedilol] Itching and Other (See Comments)    CANNOT TAKE MYLAN BRAND due to fillers (patient uses [employer]) Hallucinations Altered mental status    Consultations:  cardiology   Procedures/Studies: Dg Chest 2 View  Result Date: 11/09/2018 CLINICAL DATA:  84 year old male with chest pain and shortness of breath for 1 week. EXAM: CHEST - 2 VIEW COMPARISON:  07/05/2018 and earlier. FINDINGS: Upright AP and lateral views. Stable cardiomegaly and mediastinal contours. Prior CABG. Stable tortuosity of the thoracic aorta. Other mediastinal contours are within normal limits. Visualized tracheal air column is within normal limits. Mildly lower lung volumes. Trace fluid in the major fissures today, although no layering pleural effusion is evident. No pneumothorax or consolidation. Pulmonary vascularity is increased. No acute osseous abnormality identified. Negative visible bowel gas pattern. IMPRESSION: 1. Mildly lower lung volumes with increased pulmonary vascularity and trace pleural fluid. Consider mild or developing edema. 2. Stable cardiomegaly. Electronically Signed   By: Genevie Ann M.D.   On: 11/09/2018 17:39   Vas US Carotid  Result Date: 10/18/2018 Carotid Arterial Duplex Study Indications:       Carotid artery disease follow up. Patient has no                    cerebrovascular symptoms today. Risk Factors:      Hypertension, hyperlipidemia, past history of smoking, prior                    MI, coronary artery disease. Comparison Study:  Prior carotid duplex  done on 09/10/2017 showed right proximal                    ICA velocity 156/10 cm/sec and left proximal ICA 49/10                    cm/sec. The right subclavian showed turbulent flow with a                    diameter of 2.3 cm. Performing Technologist: Salvadore Dom RVT, RDCS (AE), RDMS   Examination Guidelines: A complete evaluation includes B-mode imaging, spectral Doppler, color Doppler, and power Doppler as needed of all accessible portions of each vessel. Bilateral testing is considered an integral part of a complete examination. Limited examinations for reoccurring indications may be performed as noted.  Right Carotid Findings: +----------+--------+--------+--------+---------------------+------------------+           PSV cm/sEDV cm/sStenosisDescribe  Comments           +----------+--------+--------+--------+---------------------+------------------+ CCA Prox  65      6                                    tortuous and                                                              intimal thickening +----------+--------+--------+--------+---------------------+------------------+ CCA Mid   101     8               heterogenous         intimal thickening +----------+--------+--------+--------+---------------------+------------------+ CCA Distal90      6               hyperechoic, diffuse,intimal thickening                                   heterogenous,                                                             irregular and                                                             calcific                                +----------+--------+--------+--------+---------------------+------------------+ ICA Prox  134     9       1-39%   heterogenous and                                                          irregular                               +----------+--------+--------+--------+---------------------+------------------+ ICA Mid    65      18                                                      +----------+--------+--------+--------+---------------------+------------------+ ICA Distal52      15                                                      +----------+--------+--------+--------+---------------------+------------------+  ECA       109     8               heterogenous and                                                          irregular                               +----------+--------+--------+--------+---------------------+------------------+ +----------+--------+-------+----------------+-------------------+           PSV cm/sEDV cmsDescribe        Arm Pressure (mmHG) +----------+--------+-------+----------------+-------------------+ [id number]             Multiphasic, [id number]                 +----------+--------+-------+----------------+-------------------+ +---------+--------+--+--------+-+---------+ VertebralPSV cm/s29EDV cm/s7Antegrade +---------+--------+--+--------+-+---------+ The right proximal subclavian/innominate diameter measured 2.3 cm  Left Carotid Findings: +----------+-------+-------+--------+------------------------+-----------------+           PSV    EDV    StenosisDescribe                Comments                    cm/s   cm/s                                                     +----------+-------+-------+--------+------------------------+-----------------+ CCA Prox  101    0                                      intimal                                                                   thickening        +----------+-------+-------+--------+------------------------+-----------------+ CCA Mid   81     6              hyperechoic                               +----------+-------+-------+--------+------------------------+-----------------+ CCA Distal80     8              heterogenous                               +----------+-------+-------+--------+------------------------+-----------------+ ICA Prox  45     11     1-39%   heterogenous and        tortuous                                          irregular                                 +----------+-------+-------+--------+------------------------+-----------------+  ICA Mid   29     7                                      tortuous          +----------+-------+-------+--------+------------------------+-----------------+ ICA Distal60     24                                     tortuous          +----------+-------+-------+--------+------------------------+-----------------+ ECA       120    2              heterogenous and                                                          irregular                                 +----------+-------+-------+--------+------------------------+-----------------+ +----------+--------+--------+----------------+-------------------+ SubclavianPSV cm/sEDV cm/sDescribe        Arm Pressure (mmHG) +----------+--------+--------+----------------+-------------------+           92              Multiphasic, [id number]                 +----------+--------+--------+----------------+-------------------+ +---------+--------+--+--------+-+---------+ VertebralPSV cm/s33EDV cm/s5Antegrade +---------+--------+--+--------+-+---------+  Summary: Right Carotid: Velocities in the right ICA are consistent with a 1-39% stenosis.                Non-hemodynamically significant plaque <50% noted in the CCA. Left Carotid: Velocities in the left ICA are consistent with a 1-39% stenosis.               Non-hemodynamically significant plaque noted in the CCA. Vertebrals:  Bilateral vertebral arteries demonstrate antegrade flow. Subclavians: Normal flow hemodynamics were seen in bilateral subclavian              arteries. *See table(s) above for measurements and observations. Suggest follow up study in 12 months.  Electronically signed by Ena Dawley MD on 10/18/2018 at 3:20:05 PM.    Final        Subjective: Sleeping on my arrival but wakes up to voice. Staff reports that he was awake most of the night. He was also awake earlier in the day and was ambulating on room air without difficulty  Discharge Exam: Vitals:   11/10/18 2040 11/11/18 0510 11/11/18 0907 11/11/18 1118  BP: (!) 127/91 (!) 148/91 131/82 121/74  Pulse: 86 86 100 84  Resp: 19 (!) 21 18 18   Temp: 97.6 F (36.4 C) 97.6 F (36.4 C)  98.2 F (36.8 C)  TempSrc: Oral Oral  Oral  SpO2: 97% 100%  100%  Weight:  58.6 kg    Height:        General: Pt is alert, awake, not in acute distress Cardiovascular: RRR, S1/S2 +, no rubs, no gallops Respiratory: crackles at bases, no wheezing, no rhonchi Abdominal: Soft, NT, ND, bowel sounds + Extremities: no edema, no cyanosis    The results of significant diagnostics from this hospitalization (including imaging, microbiology, ancillary and  laboratory) are listed below for reference.     Microbiology: Recent Results (from the past 240 hour(s))  SARS Coronavirus 2 (CEPHEID - Performed in [place] hospital lab), Hosp Order     Status: None   Collection Time: 11/09/18  6:32 PM   Specimen: Nasopharyngeal Swab  Result Value Ref Range Status   SARS Coronavirus 2 NEGATIVE NEGATIVE Final    Comment: (NOTE) If result is NEGATIVE SARS-CoV-2 target nucleic acids are NOT DETECTED. The SARS-CoV-2 RNA is generally detectable in upper and lower  respiratory specimens during the acute phase of infection. The lowest  concentration of SARS-CoV-2 viral copies this assay can detect is 250  copies / mL. A negative result does not preclude SARS-CoV-2 infection  and should not be used as the sole basis for treatment or other  patient management decisions.  A negative result may occur with  improper specimen collection / handling, submission of specimen other  than nasopharyngeal swab, presence of  viral mutation(s) within the  areas targeted by this assay, and inadequate number of viral copies  (<250 copies / mL). A negative result must be combined with clinical  observations, patient history, and epidemiological information. If result is POSITIVE SARS-CoV-2 target nucleic acids are DETECTED. The SARS-CoV-2 RNA is generally detectable in upper and lower  respiratory specimens dur ing the acute phase of infection.  Positive  results are indicative of active infection with SARS-CoV-2.  Clinical  correlation with patient history and other diagnostic information is  necessary to determine patient infection status.  Positive results do  not rule out bacterial infection or co-infection with other viruses. If result is PRESUMPTIVE POSTIVE SARS-CoV-2 nucleic acids MAY BE PRESENT.   A presumptive positive result was obtained on the submitted specimen  and confirmed on repeat testing.  While 2019 novel coronavirus  (SARS-CoV-2) nucleic acids may be present in the submitted sample  additional confirmatory testing may be necessary for epidemiological  and / or clinical management purposes  to differentiate between  SARS-CoV-2 and other Sarbecovirus currently known to infect humans.  If clinically indicated additional testing with an alternate test  methodology [phone]) is advised. The SARS-CoV-2 RNA is generally  detectable in upper and lower respiratory sp ecimens during the acute  phase of infection. The expected result is Negative. Fact Sheet for Patients:  StrictlyIdeas.no Fact Sheet for Healthcare Providers: BankingDealers.co.za This test is not yet approved or cleared by the [place] FDA and has been authorized for detection and/or diagnosis of SARS-CoV-2 by FDA under an Emergency Use Authorization (EUA).  This EUA will remain in effect (meaning this test can be used) for the duration of the COVID-19 declaration under Section  564(b)(1) of the Act, 21 U.S.C. section 360bbb-3(b)(1), unless the authorization is terminated or revoked sooner. Performed at [facility], [place] [address] [place], [place] [address]      Labs: BNP (last 3 results) Recent Labs    11/09/18 1804  BNP 8,546.2*   Basic Metabolic Panel: Recent Labs  Lab 11/09/18 1654 11/09/18 1804 11/10/18 0141 11/10/18 0344 11/10/18 1125 11/10/18 1444 11/10/18 1811 11/11/18 0350  NA 135 136 139  --   --   --   --  140  K 6.4* 5.3* 3.6 3.9 3.9 3.9 3.9 3.8  CL 103 103 104  --   --   --   --  101  CO2 23 22 24   --   --   --   --  25  GLUCOSE 108* 101* 138*  --   --   --   --  97  BUN 33* 33* 33*  --   --   --   --  35*  CREATININE 1.81* 1.80* 1.73*  --   --   --   --  1.76*  CALCIUM 8.7* 8.7* 8.8*  --   --   --   --  9.4   Liver Function Tests: No results for input(s): AST, ALT, ALKPHOS, BILITOT, PROT, ALBUMIN in the last 168 hours. No results for input(s): LIPASE, AMYLASE in the last 168 hours. No results for input(s): AMMONIA in the last 168 hours. CBC: Recent Labs  Lab 11/09/18 1654 11/11/18 0810  WBC 6.2 6.4  HGB 12.5* 14.0  HCT 38.4* 43.6  MCV 87.1 88.3  PLT 183 185   Cardiac Enzymes: No results for input(s): CKTOTAL, CKMB, CKMBINDEX, TROPONINI in the last 168 hours. BNP: Invalid input(s): POCBNP CBG: No results for input(s): GLUCAP in the last 168 hours. D-Dimer No results for input(s): DDIMER in the last 72 hours. Hgb A1c No results for input(s): HGBA1C in the last 72 hours. Lipid Profile No results for input(s): CHOL, HDL, LDLCALC, TRIG, CHOLHDL, LDLDIRECT in the last 72 hours. Thyroid function studies No results for input(s): TSH, T4TOTAL, T3FREE, THYROIDAB in the last 72 hours.  Invalid input(s): FREET3 Anemia work up No results for input(s): VITAMINB12, FOLATE, FERRITIN, TIBC, IRON, RETICCTPCT in the last 72 hours. Urinalysis    Component Value Date/Time   COLORURINE YELLOW 01/27/2015 1007    APPEARANCEUR CLOUDY (A) 01/27/2015 1007   LABSPEC 1.023 01/27/2015 1007   PHURINE 5.5 01/27/2015 1007   GLUCOSEU NEGATIVE 01/27/2015 1007   HGBUR MODERATE (A) 01/27/2015 1007   BILIRUBINUR NEGATIVE 01/27/2015 1007   KETONESUR 40 (A) 01/27/2015 1007   PROTEINUR 30 (A) 01/27/2015 1007   UROBILINOGEN 0.2 01/27/2015 1007   NITRITE NEGATIVE 01/27/2015 1007   LEUKOCYTESUR NEGATIVE 01/27/2015 1007   Sepsis Labs Invalid input(s): PROCALCITONIN,  WBC,  LACTICIDVEN Microbiology Recent Results (from the past 240 hour(s))  SARS Coronavirus 2 (CEPHEID - Performed in [place] hospital lab), Hosp Order     Status: None   Collection Time: 11/09/18  6:32 PM   Specimen: Nasopharyngeal Swab  Result Value Ref Range Status   SARS Coronavirus 2 NEGATIVE NEGATIVE Final    Comment: (NOTE) If result is NEGATIVE SARS-CoV-2 target nucleic acids are NOT DETECTED. The SARS-CoV-2 RNA is generally detectable in upper and lower  respiratory specimens during the acute phase of infection. The lowest  concentration of SARS-CoV-2 viral copies this assay can detect is 250  copies / mL. A negative result does not preclude SARS-CoV-2 infection  and should not be used as the sole basis for treatment or other  patient management decisions.  A negative result may occur with  improper specimen collection / handling, submission of specimen other  than nasopharyngeal swab, presence of viral mutation(s) within the  areas targeted by this assay, and inadequate number of viral copies  (<250 copies / mL). A negative result must be combined with clinical  observations, patient history, and epidemiological information. If result is POSITIVE SARS-CoV-2 target nucleic acids are DETECTED. The SARS-CoV-2 RNA is generally detectable in upper and lower  respiratory specimens dur ing the acute phase of infection.  Positive  results are indicative of active infection with SARS-CoV-2.  Clinical  correlation with patient history and  other diagnostic information is  necessary to determine patient infection status.  Positive results do  not rule out bacterial infection or co-infection with other viruses. If result is PRESUMPTIVE POSTIVE SARS-CoV-2 nucleic acids MAY BE PRESENT.   A presumptive positive result was obtained on the submitted specimen  and confirmed on repeat testing.  While 2019 novel coronavirus  (SARS-CoV-2) nucleic acids may be present in the submitted sample  additional confirmatory testing may be necessary for epidemiological  and / or clinical management purposes  to differentiate between  SARS-CoV-2 and other Sarbecovirus currently known to infect humans.  If clinically indicated additional testing with an alternate test  methodology [phone]) is advised. The SARS-CoV-2 RNA is generally  detectable in upper and lower respiratory sp ecimens during the acute  phase of infection. The expected result is Negative. Fact Sheet for Patients:  StrictlyIdeas.no Fact Sheet for Healthcare Providers: BankingDealers.co.za This test is not yet approved or cleared by the [place] FDA and has been authorized for detection and/or diagnosis of SARS-CoV-2 by FDA under an Emergency Use Authorization (EUA).  This EUA will remain in effect (meaning this test can be used) for the duration of the COVID-19 declaration under Section 564(b)(1) of the Act, 21 U.S.C. section 360bbb-3(b)(1), unless the authorization is terminated or revoked sooner. Performed at [facility], [place] [address], [place], [place] [address]      Time coordinating discharge: 25mins  SIGNED:   Kathie Dike, MD  [facility] 11/11/2018, 8:04 PM   If 7PM-7AM, please contact night-coverage [email or web address]

## 2018-11-15 ENCOUNTER — Other Ambulatory Visit: Payer: Self-pay

## 2018-11-15 ENCOUNTER — Emergency Department (HOSPITAL_COMMUNITY): Payer: Medicare Other

## 2018-11-15 DIAGNOSIS — R079 Chest pain, unspecified: Secondary | ICD-10-CM

## 2018-11-15 DIAGNOSIS — K819 Cholecystitis, unspecified: Secondary | ICD-10-CM | POA: Diagnosis present

## 2018-11-15 DIAGNOSIS — I2510 Atherosclerotic heart disease of native coronary artery without angina pectoris: Secondary | ICD-10-CM | POA: Diagnosis present

## 2018-11-15 DIAGNOSIS — Z7982 Long term (current) use of aspirin: Secondary | ICD-10-CM

## 2018-11-15 DIAGNOSIS — R188 Other ascites: Secondary | ICD-10-CM | POA: Diagnosis present

## 2018-11-15 DIAGNOSIS — R1012 Left upper quadrant pain: Secondary | ICD-10-CM

## 2018-11-15 DIAGNOSIS — R54 Age-related physical debility: Secondary | ICD-10-CM | POA: Diagnosis present

## 2018-11-15 DIAGNOSIS — I130 Hypertensive heart and chronic kidney disease with heart failure and stage 1 through stage 4 chronic kidney disease, or unspecified chronic kidney disease: Principal | ICD-10-CM | POA: Diagnosis present

## 2018-11-15 DIAGNOSIS — I25810 Atherosclerosis of coronary artery bypass graft(s) without angina pectoris: Secondary | ICD-10-CM | POA: Diagnosis present

## 2018-11-15 DIAGNOSIS — Z8546 Personal history of malignant neoplasm of prostate: Secondary | ICD-10-CM

## 2018-11-15 DIAGNOSIS — Z515 Encounter for palliative care: Secondary | ICD-10-CM

## 2018-11-15 DIAGNOSIS — E785 Hyperlipidemia, unspecified: Secondary | ICD-10-CM | POA: Diagnosis present

## 2018-11-15 DIAGNOSIS — Z20828 Contact with and (suspected) exposure to other viral communicable diseases: Secondary | ICD-10-CM | POA: Diagnosis present

## 2018-11-15 DIAGNOSIS — I255 Ischemic cardiomyopathy: Secondary | ICD-10-CM | POA: Diagnosis present

## 2018-11-15 DIAGNOSIS — J9601 Acute respiratory failure with hypoxia: Secondary | ICD-10-CM | POA: Diagnosis present

## 2018-11-15 DIAGNOSIS — F0390 Unspecified dementia without behavioral disturbance: Secondary | ICD-10-CM | POA: Diagnosis present

## 2018-11-15 DIAGNOSIS — I5043 Acute on chronic combined systolic (congestive) and diastolic (congestive) heart failure: Secondary | ICD-10-CM | POA: Diagnosis present

## 2018-11-15 DIAGNOSIS — N184 Chronic kidney disease, stage 4 (severe): Secondary | ICD-10-CM | POA: Diagnosis present

## 2018-11-15 DIAGNOSIS — R627 Adult failure to thrive: Secondary | ICD-10-CM | POA: Diagnosis present

## 2018-11-15 DIAGNOSIS — I214 Non-ST elevation (NSTEMI) myocardial infarction: Secondary | ICD-10-CM | POA: Diagnosis present

## 2018-11-15 DIAGNOSIS — R0602 Shortness of breath: Secondary | ICD-10-CM

## 2018-11-15 DIAGNOSIS — I110 Hypertensive heart disease with heart failure: Secondary | ICD-10-CM | POA: Diagnosis present

## 2018-11-15 DIAGNOSIS — I252 Old myocardial infarction: Secondary | ICD-10-CM

## 2018-11-15 DIAGNOSIS — N170 Acute kidney failure with tubular necrosis: Secondary | ICD-10-CM | POA: Diagnosis not present

## 2018-11-15 DIAGNOSIS — J90 Pleural effusion, not elsewhere classified: Secondary | ICD-10-CM

## 2018-11-15 DIAGNOSIS — E874 Mixed disorder of acid-base balance: Secondary | ICD-10-CM | POA: Diagnosis present

## 2018-11-15 DIAGNOSIS — R7989 Other specified abnormal findings of blood chemistry: Secondary | ICD-10-CM

## 2018-11-15 DIAGNOSIS — Z823 Family history of stroke: Secondary | ICD-10-CM

## 2018-11-15 DIAGNOSIS — I080 Rheumatic disorders of both mitral and aortic valves: Secondary | ICD-10-CM | POA: Diagnosis present

## 2018-11-15 DIAGNOSIS — I959 Hypotension, unspecified: Secondary | ICD-10-CM | POA: Diagnosis not present

## 2018-11-15 DIAGNOSIS — G9341 Metabolic encephalopathy: Secondary | ICD-10-CM | POA: Diagnosis present

## 2018-11-15 DIAGNOSIS — N179 Acute kidney failure, unspecified: Secondary | ICD-10-CM | POA: Diagnosis not present

## 2018-11-15 DIAGNOSIS — Z87891 Personal history of nicotine dependence: Secondary | ICD-10-CM

## 2018-11-15 DIAGNOSIS — Z66 Do not resuscitate: Secondary | ICD-10-CM | POA: Diagnosis present

## 2018-11-15 DIAGNOSIS — I222 Subsequent non-ST elevation (NSTEMI) myocardial infarction: Secondary | ICD-10-CM | POA: Diagnosis present

## 2018-11-15 DIAGNOSIS — I132 Hypertensive heart and chronic kidney disease with heart failure and with stage 5 chronic kidney disease, or end stage renal disease: Secondary | ICD-10-CM | POA: Diagnosis present

## 2018-11-15 DIAGNOSIS — Z7189 Other specified counseling: Secondary | ICD-10-CM

## 2018-11-15 LAB — CBC WITH DIFFERENTIAL/PLATELET
Abs Immature Granulocytes: 0.02 10*3/uL (ref 0.00–0.07)
Basophils Absolute: 0.1 10*3/uL (ref 0.0–0.1)
Basophils Relative: 1 %
Eosinophils Absolute: 0.3 10*3/uL (ref 0.0–0.5)
Eosinophils Relative: 4 %
HCT: 38.6 % — ABNORMAL LOW (ref 39.0–52.0)
Hemoglobin: 12.5 g/dL — ABNORMAL LOW (ref 13.0–17.0)
Immature Granulocytes: 0 %
Lymphocytes Relative: 32 %
Lymphs Abs: 2.3 10*3/uL (ref 0.7–4.0)
MCH: 28.5 pg (ref 26.0–34.0)
MCHC: 32.4 g/dL (ref 30.0–36.0)
MCV: 87.9 fL (ref 80.0–100.0)
Monocytes Absolute: 0.8 10*3/uL (ref 0.1–1.0)
Monocytes Relative: 12 %
Neutro Abs: 3.7 10*3/uL (ref 1.7–7.7)
Neutrophils Relative %: 51 %
Platelets: 182 10*3/uL (ref 150–400)
RBC: 4.39 MIL/uL (ref 4.22–5.81)
RDW: 14.9 % (ref 11.5–15.5)
WBC: 7.2 10*3/uL (ref 4.0–10.5)
nRBC: 0 % (ref 0.0–0.2)

## 2018-11-15 LAB — COMPREHENSIVE METABOLIC PANEL
ALT: 80 U/L — ABNORMAL HIGH (ref 0–44)
AST: 64 U/L — ABNORMAL HIGH (ref 15–41)
Albumin: 3.4 g/dL — ABNORMAL LOW (ref 3.5–5.0)
Alkaline Phosphatase: 105 U/L (ref 38–126)
Anion gap: 20 — ABNORMAL HIGH (ref 5–15)
BUN: 65 mg/dL — ABNORMAL HIGH (ref 8–23)
CO2: 20 mmol/L — ABNORMAL LOW (ref 22–32)
Calcium: 9.2 mg/dL (ref 8.9–10.3)
Chloride: 99 mmol/L (ref 98–111)
Creatinine, Ser: 2.82 mg/dL — ABNORMAL HIGH (ref 0.61–1.24)
GFR calc Af Amer: 23 mL/min — ABNORMAL LOW (ref >60)
GFR calc non Af Amer: 20 mL/min — ABNORMAL LOW (ref >60)
Glucose, Bld: 136 mg/dL — ABNORMAL HIGH (ref 70–99)
Potassium: 3.7 mmol/L (ref 3.5–5.1)
Sodium: 139 mmol/L (ref 135–145)
Total Bilirubin: 1.9 mg/dL — ABNORMAL HIGH (ref 0.3–1.2)
Total Protein: 7.1 g/dL (ref 6.5–8.1)

## 2018-11-15 LAB — LIPASE, BLOOD: Lipase: 55 U/L — ABNORMAL HIGH (ref 11–51)

## 2018-11-15 LAB — TROPONIN I (HIGH SENSITIVITY)
Troponin I (High Sensitivity): 850 ng/L (ref <18)
Troponin I (High Sensitivity): 799 ng/L (ref <18)

## 2018-11-15 LAB — GLUCOSE, CAPILLARY: Glucose-Capillary: 138 mg/dL — ABNORMAL HIGH (ref 70–99)

## 2018-11-15 LAB — SARS CORONAVIRUS 2 (TAT 6-24 HRS): SARS Coronavirus 2: NEGATIVE

## 2018-11-15 MED ORDER — ONDANSETRON HCL 4 MG PO TABS: 4.0000 mg | ORAL_TABLET | Freq: Four times a day (QID) | ORAL | Status: DC | PRN

## 2018-11-15 MED ORDER — LORATADINE 10 MG PO TABS
10.0000 mg | ORAL_TABLET | Freq: Every day | ORAL | Status: DC
  Administered 2018-11-15: 10 mg via ORAL
  Filled 2018-11-15: qty 1

## 2018-11-15 MED ORDER — CARVEDILOL 12.5 MG PO TABS
6.2500 mg | ORAL_TABLET | Freq: Two times a day (BID) | ORAL | Status: DC
  Filled 2018-11-15: qty 0.5

## 2018-11-15 MED ORDER — EZETIMIBE 10 MG PO TABS
10.0000 mg | ORAL_TABLET | Freq: Every day | ORAL | Status: DC
  Administered 2018-11-15: 10 mg via ORAL
  Filled 2018-11-15: qty 1

## 2018-11-15 MED ORDER — SODIUM CHLORIDE 0.9 % IV SOLN
INTRAVENOUS | Status: DC
  Administered 2018-11-15: 19:00:00 via INTRAVENOUS

## 2018-11-15 MED ORDER — ONDANSETRON HCL 4 MG/2ML IJ SOLN
4.0000 mg | Freq: Once | INTRAMUSCULAR | Status: AC
  Administered 2018-11-15: 4 mg via INTRAVENOUS
  Filled 2018-11-15: qty 2

## 2018-11-15 MED ORDER — OXYCODONE HCL 5 MG PO TABS: 5.0000 mg | ORAL_TABLET | ORAL | Status: DC | PRN

## 2018-11-15 MED ORDER — ISOSORBIDE MONONITRATE ER 30 MG PO TB24
30.0000 mg | ORAL_TABLET | Freq: Every day | ORAL | Status: DC
  Administered 2018-11-15: 30 mg via ORAL
  Filled 2018-11-15: qty 1

## 2018-11-15 MED ORDER — ACETAMINOPHEN 325 MG PO TABS: 650.0000 mg | ORAL_TABLET | Freq: Four times a day (QID) | ORAL | Status: DC | PRN

## 2018-11-15 MED ORDER — POLYETHYLENE GLYCOL 3350 17 G PO PACK: 17.0000 g | PACK | Freq: Every day | ORAL | Status: DC | PRN

## 2018-11-15 MED ORDER — HEPARIN SODIUM (PORCINE) 5000 UNIT/ML IJ SOLN
5000.0000 [IU] | Freq: Three times a day (TID) | INTRAMUSCULAR | Status: DC
  Administered 2018-11-15 – 2018-11-19 (×10): 5000 [IU] via SUBCUTANEOUS
  Filled 2018-11-15 (×10): qty 1

## 2018-11-15 MED ORDER — ONDANSETRON HCL 4 MG/2ML IJ SOLN
4.0000 mg | Freq: Four times a day (QID) | INTRAMUSCULAR | Status: DC | PRN
  Administered 2018-11-15 – 2018-11-18 (×4): 4 mg via INTRAVENOUS
  Filled 2018-11-15 (×3): qty 2

## 2018-11-15 MED ORDER — ASPIRIN EC 81 MG PO TBEC: 81.0000 mg | DELAYED_RELEASE_TABLET | Freq: Every day | ORAL | Status: DC

## 2018-11-15 MED ORDER — CARVEDILOL 6.25 MG PO TABS
6.2500 mg | ORAL_TABLET | Freq: Two times a day (BID) | ORAL | Status: DC
  Administered 2018-11-15: 6.25 mg via ORAL
  Filled 2018-11-15 (×2): qty 1

## 2018-11-15 MED ORDER — TRAZODONE HCL 50 MG PO TABS: 25.0000 mg | ORAL_TABLET | Freq: Every evening | ORAL | Status: DC | PRN

## 2018-11-15 MED ORDER — SODIUM CHLORIDE 0.9% FLUSH
3.0000 mL | Freq: Two times a day (BID) | INTRAVENOUS | Status: DC
  Administered 2018-11-15 – 2018-11-19 (×6): 3 mL via INTRAVENOUS

## 2018-11-15 MED ORDER — ACETAMINOPHEN 650 MG RE SUPP: 650.0000 mg | Freq: Four times a day (QID) | RECTAL | Status: DC | PRN

## 2018-11-15 MED ORDER — NITROGLYCERIN 0.4 MG SL SUBL: 0.4000 mg | SUBLINGUAL_TABLET | SUBLINGUAL | Status: DC | PRN

## 2018-11-15 NOTE — ED Notes (Signed)
Still remain unable to collect Covid test.   Pt restful at this time.  Specimen was noted as collected but unable to complete.

## 2018-11-15 NOTE — Progress Notes (Signed)
Received patient from ED-   Called  Wife- left message  to let her know room number and phone number.

## 2018-11-15 NOTE — H&P (Signed)
History and Physical    [name] PXT:[id number] DOB: 1934/08/25 DOA: 11/25/2018  PCP: Corey Contras, MD  Patient coming from: Home  I have personally briefly reviewed patient's old medical records in [place]  Chief Complaint: Presented with generalized abdominal pain without nausea and vomiting but then found to have renal insufficiency  HPI: [name] is a 84 y.o. male with medical history significant of non-ST segment myocardial infarction discharge 11/11/2018, congestive heart failure, chronic kidney disease stage III, peptic ulcer disease, hypertension, sensitivity [name] requiring a special preparation, who presents the emergency department initially complaining of left sided upper abdominal and lower chest pain that began this morning.  His pain is sharp and intermittent and radiates to his left upper back.  He had associated nausea but no vomiting.  Symptoms feel a little bit similar to his previous ED visit when he was diagnosed with a myocardial infarction.  He has no associated shortness of breath, cough, diarrhea, constipation, urinary symptoms, or fever.  Patient discharged from our facility on seven 06/2018 after admission for non-STEMI and congestive heart failure exacerbation.  Cardiac catheterization was not done due to risk of renal injury.  Consultation with cardiology was obtained and they recommended discharge home on Lasix twice daily.  ED Course: Troponin initially 850 then came down to 799.  (Felt to still be resolving from previous myocardial infarction where it was greater than 4000).  Acute kidney injury noted with creatinine elevated to 2.82 from 1.7 just a few days ago.  Hest x-ray does not show any infiltrate, CT scan of the abdomen without contrast did not show any concerning findings.  Due to elevated creatinine patient requires observation in the hospital for acute kidney injury.  Initially patient refused COVID swab consultation with additional staff  members patient did agree to a nasopharyngeal swab for COVID testing.  Results are pending.  Review of Systems: As per HPI otherwise all other systems reviewed and  negative.   Past Medical History:  Diagnosis Date  . CAD (coronary artery disease)    a. CABG (2003)  b. 9/17/6: NSTEMI s/p overlapping DES x2 to LAD and DESx1 to SVG-->OM   . Carotid artery disease ([name])    a. 09/2013: carotid dopplers with stable 1-39% ICA stenosis bilaterally  //  b. Carotid US 6/27: RICA 03-50%; LICA 0-93% >> FU 1 year // c. Carotid US 09/2016: Bilateral ICA 1-39 >> follow-up 1 year  //  d. Carotid US 5/19: Bilateral ICA 1-39; decreased flow in right subclavian artery   . CHF (congestive heart failure) ([name])   . Chronic kidney disease (CKD), stage III (moderate) ([name])   . Dementia ([name])   . History of echocardiogram    Echo 4/18: mild conc LVH, EF 35-40, diff HK, anteroseptal and inf-septal HK, Gr 2 DD, mild AS (mean 13), mod AI, mildly dilated asc Ao (43 mm), mod MAC, mod MR, severe LAE, mild TR, mod PI, PASP 43  . Hyperlipidemia   . Hypertension   . Internal hemorrhoids   . Ischemic cardiomyopathy    a. 2D ECHO 01/28/15 w/ EF 25-30%, severe HK of the basal-midinferoseptal myocardium, basal-midinferior myocardium and midanteroseptal myocardium. Akinesis of apical inferior and apicalanterior myocardium. G2DD. Mild AS, Asc aorta diameter 2m. Mild dilation of aortic root. Mild MR, mild PR and PA peak pressure 36 mmg HG.  . Peptic ulcer disease   . Prostate CA ([name]    a. s/p seed implants    Past Surgical History:  Procedure Laterality Date  . CARDIAC CATHETERIZATION N/A 01/27/2015   Procedure: Left Heart Cath and Coronary Angiography;  Surgeon: Corey Hampshire, MD;  Location: [facility];  Service: Cardiovascular;  Laterality: N/A;  . CARDIAC SURGERY  2003   SVG LAD, SVG to right coronary artery, SVG to marginal    Social History   Social History Narrative  . Not on file     reports that he  has quit smoking. He has never used smokeless tobacco. He reports that he does not drink alcohol or use drugs.  Allergies  Allergen Reactions  . Ramipril Swelling and Other (See Comments)    Angioedema   . Statins Other (See Comments)    Elevated LFTs with statins in the past  . Latex Itching and Rash  . Coreg [Carvedilol] Itching and Other (See Comments)    CANNOT TAKE MYLAN BRAND due to fillers (patient uses Auro[employer] Brand) Hallucinations Altered mental status    Family History  Problem Relation Age of Onset  . Stroke Father      Prior to Admission medications   Medication Sig Start Date End Date Taking? Authorizing Provider  aspirin EC 81 MG tablet Take 1 tablet (81 mg total) by mouth daily. 08/03/17  Yes End, Harrell Gave, MD  carvedilol (COREG) 6.25 MG tablet Take 6.25 mg by mouth 2 (two) times daily with a meal.   Yes [provider]  ezetimibe (ZETIA) 10 MG tablet Take 1 tablet (10 mg total) by mouth daily. 10/26/18  Yes Weaver, Scott T, PA-C  furosemide (LASIX) 40 MG tablet Take 1 tablet (40 mg total) by mouth 2 (two) times daily. 11/11/18  Yes Kathie Dike, MD  isosorbide mononitrate (IMDUR) 30 MG 24 hr tablet Take 1 tablet (30 mg total) by mouth daily. 09/29/18  Yes Weaver, Scott T, PA-C  loratadine (CLARITIN) 10 MG tablet Take 10 mg by mouth daily.   Yes [provider]  nitroGLYCERIN (NITROSTAT) 0.4 MG SL tablet place 1 tablet under the tongue if needed every 5 minutes for chest pain  UP TO 3 DOSES Patient taking differently: Place 0.4 mg under the tongue every 5 (five) minutes as needed for chest pain. UP TO 3 DOSES 04/19/18   [name] T, PA-C    Physical Exam:  Constitutional: NAD, calm, comfortable Vitals:   12/03/2018 1530 12/10/2018 1545 12/01/2018 1600 12/08/2018 1615  BP: 110/73 109/76 104/77 108/66  Pulse:      Resp:      Temp:      TempSrc:      SpO2:      Weight:      Height:       Eyes: PERRL, lids and conjunctivae normal ENMT:  Mucous membranes are moist. Posterior pharynx clear of any exudate or lesions.Normal dentition.  Neck: normal, supple, no masses, no thyromegaly Respiratory: clear to auscultation bilaterally, no wheezing, no crackles. Normal respiratory effort. No accessory muscle use.  Cardiovascular: Regular rate and rhythm, no murmurs / rubs / gallops. No extremity edema. 2+ pedal pulses. No carotid bruits.  Abdomen: no tenderness, no masses palpated. No hepatosplenomegaly. Bowel sounds positive.  Musculoskeletal: no clubbing / cyanosis. No joint deformity upper and lower extremities. Good ROM, no contractures. Normal muscle tone.  Skin: no rashes, lesions, ulcers. No induration Neurologic: CN 2-12 grossly intact. Sensation intact, DTR normal. Strength 4/5 in all 4.  Psychiatric: Quiet one-word answers to questions.  Oriented to name and situation   Labs on Admission: I have personally  reviewed following labs and imaging studies  CBC: Recent Labs  Lab 11/09/18 1654 11/11/18 0810 12/09/2018 0958  WBC 6.2 6.4 7.2  NEUTROABS  --   --  3.7  HGB 12.5* 14.0 12.5*  HCT 38.4* 43.6 38.6*  MCV 87.1 88.3 87.9  PLT 183 185 413   Basic Metabolic Panel: Recent Labs  Lab 11/09/18 1654 11/09/18 1804 11/10/18 0141  11/10/18 1125 11/10/18 1444 11/10/18 1811 11/11/18 0350 11/20/2018 0958  NA 135 136 139  --   --   --   --  140 139  K 6.4* 5.3* 3.6   < > 3.9 3.9 3.9 3.8 3.7  CL 103 103 104  --   --   --   --  101 99  CO2 _0 --   --   --   --  25 20*  GLUCOSE 108* 101* 138*  --   --   --   --  97 136*  BUN 33* 33* 33*  --   --   --   --  35* 65*  CREATININE 1.81* 1.80* 1.73*  --   --   --   --  1.76* 2.82*  CALCIUM 8.7* 8.7* 8.8*  --   --   --   --  9.4 9.2   < > = values in this interval not displayed.   GFR: Estimated Creatinine Clearance: 16.6 mL/min (A) (by C-G formula based on SCr of 2.82 mg/dL (H)). Liver Function Tests: Recent Labs  Lab 11/28/2018 0958  AST 64*  ALT 80*  ALKPHOS 105   BILITOT 1.9*  PROT 7.1  ALBUMIN 3.4*   Recent Labs  Lab 11/21/2018 0958  LIPASE 55*   Coagulation Profile: Recent Labs  Lab 11/09/18 1654  INR 1.2   BNP (last 3 results) Recent Labs    10/05/18 1555  PROBNP 12,132*   Urine analysis:    Component Value Date/Time   COLORURINE YELLOW 01/27/2015 1007   APPEARANCEUR CLOUDY (A) 01/27/2015 1007   LABSPEC 1.023 01/27/2015 1007   PHURINE 5.5 01/27/2015 1007   GLUCOSEU NEGATIVE 01/27/2015 1007   HGBUR MODERATE (A) 01/27/2015 1007   BILIRUBINUR NEGATIVE 01/27/2015 1007   KETONESUR 40 (A) 01/27/2015 1007   PROTEINUR 30 (A) 01/27/2015 1007   UROBILINOGEN 0.2 01/27/2015 1007   NITRITE NEGATIVE 01/27/2015 1007   LEUKOCYTESUR NEGATIVE 01/27/2015 1007    Radiological Exams on Admission: Ct Abdomen Pelvis Wo Contrast  Result Date: 12/10/2018 CLINICAL DATA:  84 year old male with abdominal pain. EXAM: CT ABDOMEN AND PELVIS WITHOUT CONTRAST TECHNIQUE: Multidetector CT imaging of the abdomen and pelvis was performed following the standard protocol without IV contrast. COMPARISON:  CT of the abdomen pelvis dated 04/05/2010 FINDINGS: Evaluation of this exam is limited in the absence of intravenous contrast. Evaluation is also limited due to paucity of intra-abdominal fat as well as streak artifact caused by metallic overlying support device. Lower chest: Partially visualized moderate appearing right pleural effusion. There is associated partial compressive atelectasis of the visualized right lower lobe. Pneumonia is not excluded. There is mild cardiomegaly, new since the prior CT. No intra-abdominal free air. Trace free fluid within the pelvis. Hepatobiliary: The liver is grossly unremarkable. Faint high attenuating content within the gallbladder most compatible with sludge or small stones. Apparent haziness of the gallbladder wall. Further evaluation with ultrasound is recommended to exclude acute cholecystitis. Pancreas: The pancreas is  suboptimally visualized but appears unremarkable as visualized. Spleen: Normal in size without focal abnormality.  Adrenals/Urinary Tract: The adrenal glands are unremarkable. The kidneys, visualized ureters, and urinary bladder are unremarkable. Stomach/Bowel: Evaluation of the bowel is limited in the absence of oral contrast. There is no bowel dilatation or definite evidence of obstruction. There is mild diffuse mesenteric edema and small fluid in the paracolic gutter. The appendix is normal. Vascular/Lymphatic: Advanced aortoiliac atherosclerotic disease. Evaluation of the vasculature is very limited in the absence of intravenous contrast. No portal venous gas. No adenopathy seen. Reproductive: Prostate brachytherapy seeds. Other: Interval decrease in the subcutaneous fat since the prior CT. Musculoskeletal: Degenerative changes of the spine. Grade 1 L2-L3 retrolisthesis. Multilevel disc desiccation and vacuum phenomena most prominent at L3-L3. No acute osseous pathology. IMPRESSION: 1. Probable gallbladder sludge or small stones. Ultrasound is recommended for better evaluation of the gallbladder. 2. No bowel obstruction.  Normal appendix. 3. Mild diffuse mesenteric edema and trace fluid within the pelvis. 4. Cardiomegaly, new since the prior CT. 5. Partially visualized moderate right pleural effusion with associated partial compressive atelectasis of the visualized right lower lobe. Pneumonia is not excluded. 6. Overall loss of intra-abdominal and subcutaneous fat since the prior CT. Underlying malignancy is not excluded. Clinical correlation is recommended. Electronically Signed   By: Anner Crete M.D.   On: 11/18/2018 13:47   Dg Chest 2 View  Result Date: 11/27/2018 CLINICAL DATA:  Generalized chest pain and abdominal pain. EXAM: CHEST - 2 VIEW COMPARISON:  Two-view chest x-ray 11/09/2018 FINDINGS: The heart is enlarged. Previously noted pulmonary vascular congestion has resolved. There are no  significant effusions. Aeration is improved at both bases. There is some residual medial right lower lobe opacity. IMPRESSION: 1. Improved aeration of both lungs. 2. Stable cardiomegaly without failure. Electronically Signed   By: San Morelle M.D.   On: 12/04/2018 10:25    EKG: Independently reviewed. Sinus rhythm Atrial premature complex Left bundle branch block no significant change since November 11, 2018   Assessment/Plan Principal Problem:   Acute kidney injury (AKI) with acute tubular necrosis (ATN) ([name]) Active Problems:   Hypertensive heart disease with chronic combined systolic and diastolic congestive heart failure ([name])   History of non-ST elevation myocardial infarction (NSTEMI)   Acute on chronic combined systolic and diastolic CHF (congestive heart failure) ([name])   Hypertensive heart and renal disease with both (congestive) heart failure and renal failure ([place])   Hyperlipidemia, unspecified   Dementia ([place])    1.  Acute kidney injury with acute tubular necrosis: Patient seems to be very sensitive to Lasix.  He has very borderline renal function.  His wife reports previous sensitivity to Lasix.  As point will place patient in the hospital.  Will hold Lasix.  Will gently hydrate with normal saline at 50 mL/h.  Patient with a complicated cardiorenal syndrome given that he has an ejection fraction of 30% noted on echocardiogram last admission.  Will require extensive support at home to be able to maintain euvolemia.  2.  Hypertensive heart with chronic combined systolic and diastolic congestive heart failure: EF 65% with diastolic dysfunction in a patient with known severe hypertension.  Pressures today likely reflect some dehydration very fairly low running systolic from 465 to the 035W.  Will restart blood pressure medications tomorrow.  3.  Recent history of non-ST elevation myocardial infarction: Patient discharged from the hospital July 2 with the same.  No cardiac cath  performed due to concerns about renal function and dedication management opted for instead.  4.  Renal disease due to hypertension: Complicated  by distant heart failure.  Will require both management from nephrology and cardiology.  I have consulted both.  5.  Hyperlipidemia: Continue home medication management which includes estimate B as patient with sensitivity to statins.  6.  Dementia: Noted.  This complicates history taking as well as ability to obtain information regarding patient's disease status.  Wife present at the time of admission.  DVT prophylaxis: Subcu heparin Code Status: Full code Family Communication: Spoke with patient's wife who was very helpful with providing history and was present at the time of admission. Disposition Plan: Likely home in a.m. if creatinine improving Consults called: Nephrology, Dr. Hollie Salk; cardiology Dr. Debara Pickett Admission status: Observation It is my clinical opinion that referral for OBSERVATION is reasonable and necessary in this patient based on the above information provided. The aforementioned taken together are felt to place the patient at high risk for further clinical deterioration. However it is anticipated that the patient may be medically stable for discharge from the hospital within 24 to 48 hours.   Lady Deutscher MD FACP [facility] Pager [phone]  How to contact the [facility] Attending or Consulting provider [place] or covering provider during after hours [place], for this patient?  1. Check the care team in [facility] and look for a) attending/consulting [facility] provider listed and b) the [facility] team listed 2. Log into [email or web address] and use [place] universal password to access. If you do not have the password, please contact the hospital operator. 3. Locate the [facility] provider you are looking for under [facility] and page to a number that you can be directly reached. 4. If you still have difficulty reaching the provider, please page  the [facility] (Director on Call) for the Hospitalists listed on amion for assistance.  If 7PM-7AM, please contact night-coverage [email or web address] Password [email or web address]  12/01/2018, 5:11 PM

## 2018-11-15 NOTE — ED Notes (Signed)
Wife- [phone]

## 2018-11-15 NOTE — ED Provider Notes (Signed)
[facility] EMERGENCY DEPARTMENT Provider Note   CSN: [id number] Arrival date & time: 11/29/2018  6789    History   Chief Complaint Chief Complaint  Patient presents with  . Abdominal Pain    HPI [name] is a 84 y.o. male past medical history of CAD status post recent NSTEMI, CHF, CKD, PUD, hypertension, presenting to the emergency department with complaint of left-sided upper abdominal/lower chest pain that began this morning.  Patient states pain is sharp and intermittent and radiates to his left upper back.  He has associated nausea without vomiting.  He states his symptoms feel little bit similar to his previous ED visit when he was diagnosed with heart attack.  Denies associated shortness of breath, cough, diarrhea, constipation, urinary symptoms, fever.  Patient was recently discharged on 11/11/2018 after admission for NSTEMI and CHF exacerbation.  He did not get cardiac catheterization during admission due to risk of renal injury.  He was discharged on an increased dose of Lasix from previous.     The history is provided by the patient and medical records.    Past Medical History:  Diagnosis Date  . CAD (coronary artery disease)    a. CABG (2003)  b. 9/17/6: NSTEMI s/p overlapping DES x2 to LAD and DESx1 to SVG-->OM   . Carotid artery disease ([place])    a. 09/2013: carotid dopplers with stable 1-39% ICA stenosis bilaterally  //  b. Carotid US 3/81: RICA 01-75%; LICA 1-02% >> FU 1 year // c. Carotid US 09/2016: Bilateral ICA 1-39 >> follow-up 1 year  //  d. Carotid US 5/19: Bilateral ICA 1-39; decreased flow in right subclavian artery   . CHF (congestive heart failure) ([place])   . Chronic kidney disease (CKD), stage III (moderate) (HCC)   . Dementia ([place])   . History of echocardiogram    Echo 4/18: mild conc LVH, EF 35-40, diff HK, anteroseptal and inf-septal HK, Gr 2 DD, mild AS (mean 13), mod AI, mildly dilated asc Ao (43 mm), mod MAC, mod MR, severe LAE, mild TR,  mod PI, PASP 43  . Hyperlipidemia   . Hypertension   . Internal hemorrhoids   . Ischemic cardiomyopathy    a. 2D ECHO 01/28/15 w/ EF 25-30%, severe HK of the basal-midinferoseptal myocardium, basal-midinferior myocardium and midanteroseptal myocardium. Akinesis of apical inferior and apicalanterior myocardium. G2DD. Mild AS, Asc aorta diameter 52m. Mild dilation of aortic root. Mild MR, mild PR and PA peak pressure 36 mmg HG.  . Peptic ulcer disease   . Prostate CA ([facility]    a. s/p seed implants    Patient Active Problem List   Diagnosis Date Noted  . Hyperkalemia 11/09/2018  . Rectal bleeding 08/03/2017  . Penile bleeding 08/03/2017  . Acute on chronic combined systolic and diastolic CHF (congestive heart failure) ([facility] 09/04/2016  . Chronic kidney disease, stage 3 ([facility] 08/04/2016  . Carotid artery disease ([facility]   . Dementia ([place]   . Coronary artery disease of native heart with stable angina pectoris ([place]   . Ischemic cardiomyopathy   . NSTEMI (non-ST elevated myocardial infarction) ([facility] 01/27/2015  . Essential hypertension 01/27/2015  . Hyperlipidemia, unspecified 01/27/2015  . History of non-ST elevation myocardial infarction (NSTEMI) 01/27/2015  . Abdominal bruit 09/08/2013  . Mitral valvular insufficiency and aortic valvular insufficiency 03/23/2009  . PROSTATE CANCER 03/22/2009  . Hypertensive heart disease with chronic combined systolic and diastolic congestive heart failure ([facility] [place] 03/22/2009    Past Surgical History:  Procedure  Laterality Date  . CARDIAC CATHETERIZATION N/A 01/27/2015   Procedure: Left Heart Cath and Coronary Angiography;  Surgeon: Wellington Hampshire, MD;  Location: [facility];  Service: Cardiovascular;  Laterality: N/A;  . CARDIAC SURGERY  2003   SVG LAD, SVG to right coronary artery, SVG to marginal        Home Medications    Prior to Admission medications   Medication Sig Start Date End Date Taking? Authorizing Provider  aspirin EC 81  MG tablet Take 1 tablet (81 mg total) by mouth daily. 08/03/17  Yes End, Harrell Gave, MD  carvedilol (COREG) 6.25 MG tablet Take 6.25 mg by mouth 2 (two) times daily with a meal.    [provider]  ezetimibe (ZETIA) 10 MG tablet Take 1 tablet (10 mg total) by mouth daily. 10/26/18   [name] T, PA-C  furosemide (LASIX) 40 MG tablet Take 1 tablet (40 mg total) by mouth 2 (two) times daily. 11/11/18   Kathie Dike, MD  isosorbide mononitrate (IMDUR) 30 MG 24 hr tablet Take 1 tablet (30 mg total) by mouth daily. 09/29/18   [name] T, PA-C  loratadine (CLARITIN) 10 MG tablet Take 10 mg by mouth daily.    [provider]  nitroGLYCERIN (NITROSTAT) 0.4 MG SL tablet place 1 tablet under the tongue if needed every 5 minutes for chest pain  UP TO 3 DOSES Patient taking differently: Place 0.4 mg under the tongue every 5 (five) minutes as needed for chest pain. UP TO 3 DOSES 04/19/18   Liliane Shi, PA-C    Family History Family History  Problem Relation Age of Onset  . Stroke Father     Social History Social History   Tobacco Use  . Smoking status: Former Research scientist (life sciences)  . Smokeless tobacco: Never Used  Substance Use Topics  . Alcohol use: No  . Drug use: No     Allergies   Ramipril, Statins, Latex, and Coreg [carvedilol]   Review of Systems Review of Systems  Constitutional: Negative for fever.  Respiratory: Negative for cough and shortness of breath.   Gastrointestinal: Positive for abdominal pain and nausea.  All other systems reviewed and are negative.    Physical Exam Updated Vital Signs BP 124/66   Pulse 88   Temp 97.9 F (36.6 C) (Oral)   Resp (!) 22   Ht 5' 3.5" (1.613 m)   Wt 59 kg   SpO2 97%   BMI 22.67 kg/m   Physical Exam Vitals signs and nursing note reviewed.  Constitutional:      Appearance: He is well-developed.  HENT:     Head: Normocephalic and atraumatic.  Eyes:     Conjunctiva/sclera: Conjunctivae normal.  Neck:      Musculoskeletal: Normal range of motion and neck supple.  Cardiovascular:     Rate and Rhythm: Normal rate and regular rhythm.     Heart sounds: Murmur present.  Pulmonary:     Effort: Pulmonary effort is normal.  Abdominal:     General: Abdomen is flat. Bowel sounds are normal. There is no distension.     Palpations: Abdomen is soft.     Tenderness: There is no abdominal tenderness. There is no guarding or rebound.  Musculoskeletal:       Back:     Right lower leg: No edema.     Left lower leg: No edema.  Skin:    General: Skin is warm.  Neurological:     Mental Status: He  is alert.  Psychiatric:        Behavior: Behavior normal.      ED Treatments / Results  Labs (all labs ordered are listed, but only abnormal results are displayed) Labs Reviewed  COMPREHENSIVE METABOLIC PANEL - Abnormal; Notable for the following components:      Result Value   CO2 20 (*)    Glucose, Bld 136 (*)    BUN 65 (*)    Creatinine, Ser 2.82 (*)    Albumin 3.4 (*)    AST 64 (*)    ALT 80 (*)    Total Bilirubin 1.9 (*)    GFR calc non Af Amer 20 (*)    GFR calc Af Amer 23 (*)    Anion gap 20 (*)    All other components within normal limits  LIPASE, BLOOD - Abnormal; Notable for the following components:   Lipase 55 (*)    All other components within normal limits  CBC WITH DIFFERENTIAL/PLATELET - Abnormal; Notable for the following components:   Hemoglobin 12.5 (*)    HCT 38.6 (*)    All other components within normal limits  TROPONIN I (HIGH SENSITIVITY) - Abnormal; Notable for the following components:   Troponin I (High Sensitivity) 850 (*)    All other components within normal limits  TROPONIN I (HIGH SENSITIVITY) - Abnormal; Notable for the following components:   Troponin I (High Sensitivity) 799 (*)    All other components within normal limits  SARS CORONAVIRUS 2 (HOSPITAL ORDER, [facility])  URINALYSIS, ROUTINE W REFLEX MICROSCOPIC    EKG EKG  Interpretation  Date/Time:  Monday November 15 2018 09:19:17 EDT Ventricular Rate:  93 PR Interval:    QRS Duration: 130 QT Interval:  386 QTC Calculation: 481 R Axis:   -62 Text Interpretation:  Sinus rhythm Atrial premature complex Left bundle branch block no significant change since November 11 2018 Confirmed by Sherwood Gambler [phone]) on 11/25/2018 12:06:57 PM   Radiology Ct Abdomen Pelvis Wo Contrast  Result Date: 12/09/2018 CLINICAL DATA:  84 year old male with abdominal pain. EXAM: CT ABDOMEN AND PELVIS WITHOUT CONTRAST TECHNIQUE: Multidetector CT imaging of the abdomen and pelvis was performed following the standard protocol without IV contrast. COMPARISON:  CT of the abdomen pelvis dated 04/05/2010 FINDINGS: Evaluation of this exam is limited in the absence of intravenous contrast. Evaluation is also limited due to paucity of intra-abdominal fat as well as streak artifact caused by metallic overlying support device. Lower chest: Partially visualized moderate appearing right pleural effusion. There is associated partial compressive atelectasis of the visualized right lower lobe. Pneumonia is not excluded. There is mild cardiomegaly, new since the prior CT. No intra-abdominal free air. Trace free fluid within the pelvis. Hepatobiliary: The liver is grossly unremarkable. Faint high attenuating content within the gallbladder most compatible with sludge or small stones. Apparent haziness of the gallbladder wall. Further evaluation with ultrasound is recommended to exclude acute cholecystitis. Pancreas: The pancreas is suboptimally visualized but appears unremarkable as visualized. Spleen: Normal in size without focal abnormality. Adrenals/Urinary Tract: The adrenal glands are unremarkable. The kidneys, visualized ureters, and urinary bladder are unremarkable. Stomach/Bowel: Evaluation of the bowel is limited in the absence of oral contrast. There is no bowel dilatation or definite evidence of obstruction.  There is mild diffuse mesenteric edema and small fluid in the paracolic gutter. The appendix is normal. Vascular/Lymphatic: Advanced aortoiliac atherosclerotic disease. Evaluation of the vasculature is very limited in the absence of intravenous  contrast. No portal venous gas. No adenopathy seen. Reproductive: Prostate brachytherapy seeds. Other: Interval decrease in the subcutaneous fat since the prior CT. Musculoskeletal: Degenerative changes of the spine. Grade 1 L2-L3 retrolisthesis. Multilevel disc desiccation and vacuum phenomena most prominent at L3-L3. No acute osseous pathology. IMPRESSION: 1. Probable gallbladder sludge or small stones. Ultrasound is recommended for better evaluation of the gallbladder. 2. No bowel obstruction.  Normal appendix. 3. Mild diffuse mesenteric edema and trace fluid within the pelvis. 4. Cardiomegaly, new since the prior CT. 5. Partially visualized moderate right pleural effusion with associated partial compressive atelectasis of the visualized right lower lobe. Pneumonia is not excluded. 6. Overall loss of intra-abdominal and subcutaneous fat since the prior CT. Underlying malignancy is not excluded. Clinical correlation is recommended. Electronically Signed   By: Anner Crete M.D.   On: 12/08/2018 13:47   Dg Chest 2 View  Result Date: 12/05/2018 CLINICAL DATA:  Generalized chest pain and abdominal pain. EXAM: CHEST - 2 VIEW COMPARISON:  Two-view chest x-ray 11/09/2018 FINDINGS: The heart is enlarged. Previously noted pulmonary vascular congestion has resolved. There are no significant effusions. Aeration is improved at both bases. There is some residual medial right lower lobe opacity. IMPRESSION: 1. Improved aeration of both lungs. 2. Stable cardiomegaly without failure. Electronically Signed   By: San Morelle M.D.   On: 11/23/2018 10:25    Procedures Procedures (including critical care time)  Medications Ordered in ED Medications  ondansetron (ZOFRAN)  injection 4 mg (4 mg Intravenous Given 11/28/2018 1155)     Initial Impression / Assessment and Plan / ED Course  I have reviewed the triage vital signs and the nursing notes.  Pertinent labs & imaging results that were available during my care of the patient were reviewed by me and considered in my medical decision making (see chart for details).  Clinical Course as of Nov 14 1612  Mon Nov 15, 2018  1441 Consulted with Dr. Evangeline Gula with triad hospitalist.  She will accept admission if patient consents to COVID swab.  She states she will decline admission if patient does not receive COVID swab today.   [JR]    Clinical Course User Index [JR] Robinson, [place] N, PA-C       Patient presenting with abdominal pain of unclear etiology that began this morning.  Pain is left upper quadrant and sharp intermittent and radiating to the back with associated nausea.  Afebrile.  Pain is not reproducible on exam.  Patient was recently discharged a few day prior for admission for N STEMI and CHF exacerbation.  He was discharged on increased dose of Lasix.  Troponin today is elevated at 850 initially, however appears to be downtrending from previous elevation during admission.  Repeat troponin is 799.  CBC without leukocytosis.  CMP does reveal an acute kidney injury with a creatinine elevated to 2.82 from 1.764 days ago.  Chest x-ray is negative for new infiltrate.  CT of the abdomen shows nonspecific findings, none of which likely attribute to patient's pain today. Possibilty of PUD, pt has hx of this in the past. Patient requires admission for acute kidney injury, likely secondary to increased Lasix dose.  Of note, patient noted to refuse COVID swab per nursing.  Consult placed to triad hospitalist, discussed with Dr. Evangeline Gula who voiced concerns regarding lack of most current COVID swab. ED charge handling logistics at this time for best course of action.  Final Clinical Impressions(s) / ED Diagnoses   Final  diagnoses:  Acute kidney injury ([place])  Intermittent left upper quadrant abdominal pain    ED Discharge Orders    None       Robinson, [place] N, PA-C 11/12/2018 1620    Sherwood Gambler, MD 11/20/18 [phone]

## 2018-11-15 NOTE — ED Notes (Signed)
Insurance account manager have been collaborating with MD/Lab on details for admission.  This RN spoke with daughter and wife and will agree to a test that enters only the posterior nares.  Pt still had difficulty holding still and allowing the swab.  Wife in room and was able to assist.  Lunch provided to both and no further needs noted.

## 2018-11-15 NOTE — ED Notes (Signed)
Attempt to give report.  Pt nurse currently off the floor and will call back.

## 2018-11-15 NOTE — ED Notes (Signed)
Spoke with Wife Chauncy [phone] update given at this time.

## 2018-11-15 NOTE — ED Notes (Signed)
ED TO INPATIENT HANDOFF REPORT  ED Nurse Name and Phone #: Celene Squibb RN  S Name/Age/Gender [name] 84 y.o. male Room/Bed: 023C/023C  Code Status   Code Status: Full Code  Home/SNF/Other Home Patient oriented to: self, place, time and situation Is this baseline? Yes   Triage Complete: Triage complete  Chief Complaint abd pain  Triage Note Pt here for evaluation of generalized abdominal pain onset today. Endorses nausea but no vomiting. Has spit up phlegm a couple of times per wife. Recent d/c and chance in Lasix rx.   Allergies Allergies  Allergen Reactions  . Ramipril Swelling and Other (See Comments)    Angioedema   . Statins Other (See Comments)    Elevated LFTs with statins in the past  . Latex Itching and Rash  . Coreg [Carvedilol] Itching and Other (See Comments)    CANNOT TAKE MYLAN BRAND due to fillers (patient uses Aurobind[employer] Brand) Hallucinations Altered mental status    Level of Care/Admitting Diagnosis ED Disposition    ED Disposition Condition [place] [facility]: [place] [[id number]]  Level of Care: Telemetry Cardiac [103]  I expect the patient will be discharged within 24 hours: No (not a candidate for 5C-Observation unit)  Covid Evaluation: Asymptomatic Screening Protocol (No Symptoms)  Diagnosis: Acute kidney injury (AKI) with acute tubular necrosis (ATN) [name]) [[id number]]  Admitting Physician: Lady Deutscher [[id number]]  Attending Physician: Lady Deutscher [[id number]]  PT Class (Do Not Modify): Observation [104]  PT Acc Code (Do Not Modify): Observation [10022]       B Medical/Surgery History Past Medical History:  Diagnosis Date  . CAD (coronary artery disease)    a. CABG (2003)  b. 9/17/6: NSTEMI s/p overlapping DES x2 to LAD and DESx1 to SVG-->OM   . Carotid artery disease ([place])    a. 09/2013: carotid dopplers with stable 1-39% ICA stenosis bilaterally  //  b. Carotid US 4/82: RICA 70-78%; LICA  6-75% >> FU 1 year // c. Carotid US 09/2016: Bilateral ICA 1-39 >> follow-up 1 year  //  d. Carotid US 5/19: Bilateral ICA 1-39; decreased flow in right subclavian artery   . CHF (congestive heart failure) ([place])   . Chronic kidney disease (CKD), stage III (moderate) (HCC)   . Dementia ([place])   . History of echocardiogram    Echo 4/18: mild conc LVH, EF 35-40, diff HK, anteroseptal and inf-septal HK, Gr 2 DD, mild AS (mean 13), mod AI, mildly dilated asc Ao (43 mm), mod MAC, mod MR, severe LAE, mild TR, mod PI, PASP 43  . Hyperlipidemia   . Hypertension   . Internal hemorrhoids   . Ischemic cardiomyopathy    a. 2D ECHO 01/28/15 w/ EF 25-30%, severe HK of the basal-midinferoseptal myocardium, basal-midinferior myocardium and midanteroseptal myocardium. Akinesis of apical inferior and apicalanterior myocardium. G2DD. Mild AS, Asc aorta diameter 60m. Mild dilation of aortic root. Mild MR, mild PR and PA peak pressure 36 mmg HG.  . Peptic ulcer disease   . Prostate CA ([facility]    a. s/p seed implants   Past Surgical History:  Procedure Laterality Date  . CARDIAC CATHETERIZATION N/A 01/27/2015   Procedure: Left Heart Cath and Coronary Angiography;  Surgeon: MWellington Hampshire MD;  [facility];  Service: Cardiovascular;  Laterality: N/A;  . CARDIAC SURGERY  2003   SVG LAD, SVG to right coronary artery, SVG to marginal     A IV Location/Drains/Wounds Patient  Lines/Drains/Airways Status   Active Line/Drains/Airways    Name:   Placement date:   Placement time:   Site:   Days:   Peripheral IV 11/25/2018 Left Antecubital   12/05/2018    0917    Antecubital   less than 1          Intake/Output Last 24 hours No intake or output data in the 24 hours ending 11/26/2018 1714  Labs/Imaging Results for orders placed or performed during the hospital encounter of 11/14/2018 (from the past 48 hour(s))  Comprehensive metabolic panel     Status: Abnormal   Collection Time: 11/22/2018  9:58 AM  Result  Value Ref Range   Sodium 139 135 - 145 mmol/L   Potassium 3.7 3.5 - 5.1 mmol/L   Chloride 99 98 - 111 mmol/L   CO2 20 (L) 22 - 32 mmol/L   Glucose, Bld 136 (H) 70 - 99 mg/dL   BUN 65 (H) 8 - 23 mg/dL   Creatinine, Ser 2.82 (H) 0.61 - 1.24 mg/dL   Calcium 9.2 8.9 - 10.3 mg/dL   Total Protein 7.1 6.5 - 8.1 g/dL   Albumin 3.4 (L) 3.5 - 5.0 g/dL   AST 64 (H) 15 - 41 U/L   ALT 80 (H) 0 - 44 U/L   Alkaline Phosphatase 105 38 - 126 U/L   Total Bilirubin 1.9 (H) 0.3 - 1.2 mg/dL   GFR calc non Af Amer 20 (L) >60 mL/min   GFR calc Af Amer 23 (L) >60 mL/min   Anion gap 20 (H) 5 - 15    Comment: Performed at [facility], [address] [place], [place] [address]  Lipase, blood     Status: Abnormal   Collection Time: 11/25/2018  9:58 AM  Result Value Ref Range   Lipase 55 (H) 11 - 51 U/L    Comment: Performed at [facility], [place] [address], [place], [place] [address]  CBC with Differential     Status: Abnormal   Collection Time: 12/05/2018  9:58 AM  Result Value Ref Range   WBC 7.2 4.0 - 10.5 K/uL   RBC 4.39 4.22 - 5.81 MIL/uL   Hemoglobin 12.5 (L) 13.0 - 17.0 g/dL   HCT 38.6 (L) 39.0 - 52.0 %   MCV 87.9 80.0 - 100.0 fL   MCH 28.5 26.0 - 34.0 pg   MCHC 32.4 30.0 - 36.0 g/dL   RDW 14.9 11.5 - 15.5 %   Platelets 182 150 - 400 K/uL   nRBC 0.0 0.0 - 0.2 %   Neutrophils Relative % 51 %   Neutro Abs 3.7 1.7 - 7.7 K/uL   Lymphocytes Relative 32 %   Lymphs Abs 2.3 0.7 - 4.0 K/uL   Monocytes Relative 12 %   Monocytes Absolute 0.8 0.1 - 1.0 K/uL   Eosinophils Relative 4 %   Eosinophils Absolute 0.3 0.0 - 0.5 K/uL   Basophils Relative 1 %   Basophils Absolute 0.1 0.0 - 0.1 K/uL   Immature Granulocytes 0 %   Abs Immature Granulocytes 0.02 0.00 - 0.07 K/uL    Comment: Performed at [facility] [address], [place], [place] [address]  Troponin I (High Sensitivity)     Status: Abnormal   Collection Time: 11/24/2018  9:58 AM  Result Value Ref Range   Troponin I (High  Sensitivity) 850 (HH) <18 ng/L    Comment: CRITICAL RESULT CALLED TO, READ BACK BY AND VERIFIED WITH: BURTRAND,S RN @  1135 12/06/2018 LEONARD,A (NOTE) Elevated high sensitivity troponin I (hsTnI) values and significant  changes across serial measurements may suggest ACS but many other  chronic and acute conditions are known to elevate hsTnI results.  Refer to the Links section for chest pain algorithms and additional  guidance. Performed at [facility], [place] [address] [place], [place] [address]   Troponin I (High Sensitivity)     Status: Abnormal   Collection Time: 11/28/2018 11:53 AM  Result Value Ref Range   Troponin I (High Sensitivity) 799 (HH) <18 ng/L    Comment: CRITICAL VALUE NOTED.  VALUE IS CONSISTENT WITH PREVIOUSLY REPORTED AND CALLED VALUE. (NOTE) Elevated high sensitivity troponin I (hsTnI) values and significant  changes across serial measurements may suggest ACS but many other  chronic and acute conditions are known to elevate hsTnI results.  Refer to the Links section for chest pain algorithms and additional  guidance. Performed at [facility], [place] [address], [place], [place] [address]    Ct Abdomen Pelvis Wo Contrast  Result Date: 11/14/2018 CLINICAL DATA:  84 year old male with abdominal pain. EXAM: CT ABDOMEN AND PELVIS WITHOUT CONTRAST TECHNIQUE: Multidetector CT imaging of the abdomen and pelvis was performed following the standard protocol without IV contrast. COMPARISON:  CT of the abdomen pelvis dated 04/05/2010 FINDINGS: Evaluation of this exam is limited in the absence of intravenous contrast. Evaluation is also limited due to paucity of intra-abdominal fat as well as streak artifact caused by metallic overlying support device. Lower chest: Partially visualized moderate appearing right pleural effusion. There is associated partial compressive atelectasis of the visualized right lower lobe. Pneumonia is not excluded. There is mild cardiomegaly, new  since the prior CT. No intra-abdominal free air. Trace free fluid within the pelvis. Hepatobiliary: The liver is grossly unremarkable. Faint high attenuating content within the gallbladder most compatible with sludge or small stones. Apparent haziness of the gallbladder wall. Further evaluation with ultrasound is recommended to exclude acute cholecystitis. Pancreas: The pancreas is suboptimally visualized but appears unremarkable as visualized. Spleen: Normal in size without focal abnormality. Adrenals/Urinary Tract: The adrenal glands are unremarkable. The kidneys, visualized ureters, and urinary bladder are unremarkable. Stomach/Bowel: Evaluation of the bowel is limited in the absence of oral contrast. There is no bowel dilatation or definite evidence of obstruction. There is mild diffuse mesenteric edema and small fluid in the paracolic gutter. The appendix is normal. Vascular/Lymphatic: Advanced aortoiliac atherosclerotic disease. Evaluation of the vasculature is very limited in the absence of intravenous contrast. No portal venous gas. No adenopathy seen. Reproductive: Prostate brachytherapy seeds. Other: Interval decrease in the subcutaneous fat since the prior CT. Musculoskeletal: Degenerative changes of the spine. Grade 1 L2-L3 retrolisthesis. Multilevel disc desiccation and vacuum phenomena most prominent at L3-L3. No acute osseous pathology. IMPRESSION: 1. Probable gallbladder sludge or small stones. Ultrasound is recommended for better evaluation of the gallbladder. 2. No bowel obstruction.  Normal appendix. 3. Mild diffuse mesenteric edema and trace fluid within the pelvis. 4. Cardiomegaly, new since the prior CT. 5. Partially visualized moderate right pleural effusion with associated partial compressive atelectasis of the visualized right lower lobe. Pneumonia is not excluded. 6. Overall loss of intra-abdominal and subcutaneous fat since the prior CT. Underlying malignancy is not excluded. Clinical  correlation is recommended. Electronically Signed   By: Anner Crete M.D.   On: 12/04/2018 13:47   Dg Chest 2 View  Result Date: 12/03/2018 CLINICAL DATA:  Generalized chest pain and abdominal pain. EXAM: CHEST -  2 VIEW COMPARISON:  Two-view chest x-ray 11/09/2018 FINDINGS: The heart is enlarged. Previously noted pulmonary vascular congestion has resolved. There are no significant effusions. Aeration is improved at both bases. There is some residual medial right lower lobe opacity. IMPRESSION: 1. Improved aeration of both lungs. 2. Stable cardiomegaly without failure. Electronically Signed   By: San Morelle M.D.   On: 11/30/2018 10:25    Pending Labs Unresulted Labs (From admission, onward)    Start     Ordered   11/16/18 4599  Basic metabolic panel  Tomorrow morning,   R     12/04/2018 1654   12/04/2018 1635  SARS Coronavirus 2 (Performed in [facility] hospital lab)  ([place])  Once,   STAT    Question:  Pre-procedural testing  Answer:  Yes   11/14/2018 1634   12/05/2018 1633  CBC  (heparin)  Once,   STAT    Comments: Baseline for heparin therapy IF NOT ALREADY DRAWN.  Notify MD if PLT < 100 K.    12/02/2018 1654   11/16/2018 1633  Creatinine, serum  (heparin)  Once,   STAT    Comments: Baseline for heparin therapy IF NOT ALREADY DRAWN.    11/11/2018 1654   12/08/2018 1212  SARS Coronavirus 2 (CEPHEID - Performed in [place] hospital lab), [place]  (Asymptomatic Patients Labs)  Once,   STAT    Question:  Rule Out  Answer:  Yes   11/18/2018 1211   12/10/2018 0958  Urinalysis, Routine w reflex microscopic  ONCE - STAT,   STAT     11/17/2018 0959          Vitals/Pain Today's Vitals   11/23/2018 1530 12/03/2018 1545 11/26/2018 1600 11/29/2018 1615  BP: 110/73 109/76 104/77 108/66  Pulse:      Resp:      Temp:      TempSrc:      SpO2:      Weight:      Height:      PainSc:        Isolation Precautions No active isolations  Medications Medications  aspirin EC tablet 81 mg (has  no administration in time range)  carvedilol (COREG) tablet 6.25 mg (has no administration in time range)  ezetimibe (ZETIA) tablet 10 mg (has no administration in time range)  isosorbide mononitrate (IMDUR) 24 hr tablet 30 mg (has no administration in time range)  nitroGLYCERIN (NITROSTAT) SL tablet 0.4 mg (has no administration in time range)  loratadine (CLARITIN) tablet 10 mg (has no administration in time range)  heparin injection 5,000 Units (has no administration in time range)  sodium chloride flush (NS) 0.9 % injection 3 mL (has no administration in time range)  0.9 %  sodium chloride infusion (has no administration in time range)  acetaminophen (TYLENOL) tablet 650 mg (has no administration in time range)    Or  acetaminophen (TYLENOL) suppository 650 mg (has no administration in time range)  oxyCODONE (Oxy IR/ROXICODONE) immediate release tablet 5 mg (has no administration in time range)  traZODone (DESYREL) tablet 25 mg (has no administration in time range)  polyethylene glycol (MIRALAX / GLYCOLAX) packet 17 g (has no administration in time range)  ondansetron (ZOFRAN) tablet 4 mg (has no administration in time range)    Or  ondansetron (ZOFRAN) injection 4 mg (has no administration in time range)  ondansetron (ZOFRAN) injection 4 mg (4 mg Intravenous Given 12/02/2018 1155)    Mobility walks Moderate fall risk  Focused Assessments Cardiac Assessment Handoff:    Lab Results  Component Value Date   TROPONINI 0.03 ([place]) 06/20/2017   No results found for: DDIMER Does the Patient currently have chest pain? No     R Recommendations: See Admitting Provider Note  Report given to:   Additional Notes:

## 2018-11-15 NOTE — ED Triage Notes (Signed)
Pt here for evaluation of generalized abdominal pain onset today. Endorses nausea but no vomiting. Has spit up phlegm a couple of times per wife. Recent d/c and chance in Lasix rx.

## 2018-11-15 NOTE — ED Notes (Signed)
Pt states he is having L sided CP with radiates to right side chest with dyspnea.  Alert with slow responses, very HOH.

## 2018-11-15 NOTE — ED Notes (Signed)
Pt being transported to CT

## 2018-11-15 NOTE — ED Notes (Signed)
Wife [phone]

## 2018-11-15 NOTE — ED Notes (Signed)
Wife and daughter upset with the need for a covid test and are concerned about our policies.  Concerned that he was tested at last admit against his will, etc.   Phone number of daughter given to Lamar Sprinkles and Lionel December. Daughter Predipa- [phone]

## 2018-11-15 NOTE — ED Notes (Signed)
Update given to wife over the phone regarding lab tests and repeat Troponin.  Wife is sitting outside the ED and I went out to reassure her.  No concerns noted at this time.

## 2018-11-16 ENCOUNTER — Observation Stay (HOSPITAL_COMMUNITY): Payer: Medicare Other

## 2018-11-16 ENCOUNTER — Inpatient Hospital Stay (HOSPITAL_COMMUNITY): Payer: Medicare Other

## 2018-11-16 ENCOUNTER — Telehealth: Payer: Self-pay

## 2018-11-16 DIAGNOSIS — F0390 Unspecified dementia without behavioral disturbance: Secondary | ICD-10-CM | POA: Diagnosis present

## 2018-11-16 DIAGNOSIS — I25810 Atherosclerosis of coronary artery bypass graft(s) without angina pectoris: Secondary | ICD-10-CM | POA: Diagnosis present

## 2018-11-16 DIAGNOSIS — K819 Cholecystitis, unspecified: Secondary | ICD-10-CM | POA: Diagnosis present

## 2018-11-16 DIAGNOSIS — R079 Chest pain, unspecified: Secondary | ICD-10-CM

## 2018-11-16 DIAGNOSIS — E874 Mixed disorder of acid-base balance: Secondary | ICD-10-CM | POA: Diagnosis present

## 2018-11-16 DIAGNOSIS — I350 Nonrheumatic aortic (valve) stenosis: Secondary | ICD-10-CM

## 2018-11-16 DIAGNOSIS — I5042 Chronic combined systolic (congestive) and diastolic (congestive) heart failure: Secondary | ICD-10-CM

## 2018-11-16 DIAGNOSIS — Z20828 Contact with and (suspected) exposure to other viral communicable diseases: Secondary | ICD-10-CM | POA: Diagnosis present

## 2018-11-16 DIAGNOSIS — N170 Acute kidney failure with tubular necrosis: Secondary | ICD-10-CM

## 2018-11-16 DIAGNOSIS — R54 Age-related physical debility: Secondary | ICD-10-CM | POA: Diagnosis present

## 2018-11-16 DIAGNOSIS — I080 Rheumatic disorders of both mitral and aortic valves: Secondary | ICD-10-CM | POA: Diagnosis present

## 2018-11-16 DIAGNOSIS — Z7189 Other specified counseling: Secondary | ICD-10-CM | POA: Diagnosis not present

## 2018-11-16 DIAGNOSIS — Z515 Encounter for palliative care: Secondary | ICD-10-CM | POA: Diagnosis present

## 2018-11-16 DIAGNOSIS — I214 Non-ST elevation (NSTEMI) myocardial infarction: Secondary | ICD-10-CM | POA: Diagnosis present

## 2018-11-16 DIAGNOSIS — J9601 Acute respiratory failure with hypoxia: Secondary | ICD-10-CM | POA: Diagnosis present

## 2018-11-16 DIAGNOSIS — R7989 Other specified abnormal findings of blood chemistry: Secondary | ICD-10-CM

## 2018-11-16 DIAGNOSIS — I130 Hypertensive heart and chronic kidney disease with heart failure and stage 1 through stage 4 chronic kidney disease, or unspecified chronic kidney disease: Secondary | ICD-10-CM | POA: Diagnosis present

## 2018-11-16 DIAGNOSIS — E785 Hyperlipidemia, unspecified: Secondary | ICD-10-CM | POA: Diagnosis present

## 2018-11-16 DIAGNOSIS — Z7982 Long term (current) use of aspirin: Secondary | ICD-10-CM | POA: Diagnosis not present

## 2018-11-16 DIAGNOSIS — G9341 Metabolic encephalopathy: Secondary | ICD-10-CM | POA: Diagnosis present

## 2018-11-16 DIAGNOSIS — R0602 Shortness of breath: Secondary | ICD-10-CM

## 2018-11-16 DIAGNOSIS — N184 Chronic kidney disease, stage 4 (severe): Secondary | ICD-10-CM | POA: Diagnosis present

## 2018-11-16 DIAGNOSIS — F0150 Vascular dementia without behavioral disturbance: Secondary | ICD-10-CM | POA: Diagnosis not present

## 2018-11-16 DIAGNOSIS — R188 Other ascites: Secondary | ICD-10-CM | POA: Diagnosis present

## 2018-11-16 DIAGNOSIS — R627 Adult failure to thrive: Secondary | ICD-10-CM | POA: Diagnosis present

## 2018-11-16 DIAGNOSIS — I5043 Acute on chronic combined systolic (congestive) and diastolic (congestive) heart failure: Secondary | ICD-10-CM | POA: Diagnosis present

## 2018-11-16 DIAGNOSIS — N179 Acute kidney failure, unspecified: Secondary | ICD-10-CM | POA: Diagnosis present

## 2018-11-16 DIAGNOSIS — I222 Subsequent non-ST elevation (NSTEMI) myocardial infarction: Secondary | ICD-10-CM | POA: Diagnosis present

## 2018-11-16 DIAGNOSIS — I2510 Atherosclerotic heart disease of native coronary artery without angina pectoris: Secondary | ICD-10-CM

## 2018-11-16 DIAGNOSIS — I959 Hypotension, unspecified: Secondary | ICD-10-CM | POA: Diagnosis not present

## 2018-11-16 DIAGNOSIS — Z66 Do not resuscitate: Secondary | ICD-10-CM | POA: Diagnosis present

## 2018-11-16 DIAGNOSIS — I255 Ischemic cardiomyopathy: Secondary | ICD-10-CM | POA: Diagnosis present

## 2018-11-16 LAB — BLOOD GAS, ARTERIAL
Acid-base deficit: 8.2 mmol/L — ABNORMAL HIGH (ref 0.0–2.0)
Bicarbonate: 14 mmol/L — ABNORMAL LOW (ref 20.0–28.0)
Delivery systems: POSITIVE
Drawn by: 10006
Expiratory PAP: 5
FIO2: 100
Inspiratory PAP: 18
O2 Saturation: 100 %
Patient temperature: 97.4
RATE: 12 resp/min
pH, Arterial: 7.548 — ABNORMAL HIGH (ref 7.350–7.450)
pO2, Arterial: 530 mmHg — ABNORMAL HIGH (ref 83.0–108.0)

## 2018-11-16 LAB — CBC WITH DIFFERENTIAL/PLATELET
Abs Immature Granulocytes: 0.02 10*3/uL (ref 0.00–0.07)
Basophils Absolute: 0 10*3/uL (ref 0.0–0.1)
Basophils Relative: 0 %
Eosinophils Absolute: 0 10*3/uL (ref 0.0–0.5)
Eosinophils Relative: 0 %
HCT: 36.2 % — ABNORMAL LOW (ref 39.0–52.0)
Hemoglobin: 11.9 g/dL — ABNORMAL LOW (ref 13.0–17.0)
Immature Granulocytes: 0 %
Lymphocytes Relative: 21 %
Lymphs Abs: 1.4 10*3/uL (ref 0.7–4.0)
MCH: 28.7 pg (ref 26.0–34.0)
MCHC: 32.9 g/dL (ref 30.0–36.0)
MCV: 87.4 fL (ref 80.0–100.0)
Monocytes Absolute: 0.7 10*3/uL (ref 0.1–1.0)
Monocytes Relative: 11 %
Neutro Abs: 4.4 10*3/uL (ref 1.7–7.7)
Neutrophils Relative %: 68 %
Platelets: 159 10*3/uL (ref 150–400)
RBC: 4.14 MIL/uL — ABNORMAL LOW (ref 4.22–5.81)
RDW: 15.2 % (ref 11.5–15.5)
WBC: 6.5 10*3/uL (ref 4.0–10.5)
nRBC: 0.5 % — ABNORMAL HIGH (ref 0.0–0.2)

## 2018-11-16 LAB — MAGNESIUM: Magnesium: 2.6 mg/dL — ABNORMAL HIGH (ref 1.7–2.4)

## 2018-11-16 LAB — COMPREHENSIVE METABOLIC PANEL
ALT: 98 U/L — ABNORMAL HIGH (ref 0–44)
ALT: 206 U/L — ABNORMAL HIGH (ref 0–44)
AST: 84 U/L — ABNORMAL HIGH (ref 15–41)
AST: 222 U/L — ABNORMAL HIGH (ref 15–41)
Albumin: 3.2 g/dL — ABNORMAL LOW (ref 3.5–5.0)
Albumin: 3.1 g/dL — ABNORMAL LOW (ref 3.5–5.0)
Alkaline Phosphatase: 109 U/L (ref 38–126)
Alkaline Phosphatase: 90 U/L (ref 38–126)
Anion gap: 23 — ABNORMAL HIGH (ref 5–15)
Anion gap: 19 — ABNORMAL HIGH (ref 5–15)
BUN: 89 mg/dL — ABNORMAL HIGH (ref 8–23)
BUN: 98 mg/dL — ABNORMAL HIGH (ref 8–23)
CO2: 17 mmol/L — ABNORMAL LOW (ref 22–32)
CO2: 19 mmol/L — ABNORMAL LOW (ref 22–32)
Calcium: 8.9 mg/dL (ref 8.9–10.3)
Calcium: 8.6 mg/dL — ABNORMAL LOW (ref 8.9–10.3)
Chloride: 98 mmol/L (ref 98–111)
Chloride: 101 mmol/L (ref 98–111)
Creatinine, Ser: 3.83 mg/dL — ABNORMAL HIGH (ref 0.61–1.24)
Creatinine, Ser: 4.36 mg/dL — ABNORMAL HIGH (ref 0.61–1.24)
GFR calc Af Amer: 16 mL/min — ABNORMAL LOW (ref >60)
GFR calc Af Amer: 14 mL/min — ABNORMAL LOW (ref >60)
GFR calc non Af Amer: 14 mL/min — ABNORMAL LOW (ref >60)
GFR calc non Af Amer: 12 mL/min — ABNORMAL LOW (ref >60)
Glucose, Bld: 197 mg/dL — ABNORMAL HIGH (ref 70–99)
Glucose, Bld: 132 mg/dL — ABNORMAL HIGH (ref 70–99)
Potassium: 3.9 mmol/L (ref 3.5–5.1)
Potassium: 4.4 mmol/L (ref 3.5–5.1)
Sodium: 138 mmol/L (ref 135–145)
Sodium: 139 mmol/L (ref 135–145)
Total Bilirubin: 1.6 mg/dL — ABNORMAL HIGH (ref 0.3–1.2)
Total Bilirubin: 1.3 mg/dL — ABNORMAL HIGH (ref 0.3–1.2)
Total Protein: 6.9 g/dL (ref 6.5–8.1)
Total Protein: 6.6 g/dL (ref 6.5–8.1)

## 2018-11-16 LAB — CBC
HCT: 41.1 % (ref 39.0–52.0)
Hemoglobin: 13.2 g/dL (ref 13.0–17.0)
MCH: 28.4 pg (ref 26.0–34.0)
MCHC: 32.1 g/dL (ref 30.0–36.0)
MCV: 88.6 fL (ref 80.0–100.0)
Platelets: 140 10*3/uL — ABNORMAL LOW (ref 150–400)
RBC: 4.64 MIL/uL (ref 4.22–5.81)
RDW: 15.3 % (ref 11.5–15.5)
WBC: 8.6 10*3/uL (ref 4.0–10.5)
nRBC: 0.2 % (ref 0.0–0.2)

## 2018-11-16 LAB — LACTIC ACID, PLASMA: Lactic Acid, Venous: 3 mmol/L (ref 0.5–1.9)

## 2018-11-16 LAB — TROPONIN I (HIGH SENSITIVITY): Troponin I (High Sensitivity): 1931 ng/L (ref <18)

## 2018-11-16 MED ORDER — LORAZEPAM 2 MG/ML IJ SOLN
INTRAMUSCULAR | Status: AC
  Administered 2018-11-16: 0.5 mg
  Filled 2018-11-16: qty 1

## 2018-11-16 MED ORDER — ASPIRIN 300 MG RE SUPP
300.0000 mg | Freq: Every day | RECTAL | Status: DC
  Administered 2018-11-16 – 2018-11-19 (×4): 300 mg via RECTAL
  Filled 2018-11-16 (×6): qty 1

## 2018-11-16 MED ORDER — FENTANYL CITRATE (PF) 100 MCG/2ML IJ SOLN
12.5000 ug | INTRAMUSCULAR | Status: DC | PRN
  Administered 2018-11-17 (×2): 12.5 ug via INTRAVENOUS
  Filled 2018-11-16 (×2): qty 2

## 2018-11-16 MED ORDER — PIPERACILLIN-TAZOBACTAM 3.375 G IVPB
3.3750 g | Freq: Three times a day (TID) | INTRAVENOUS | Status: DC
  Administered 2018-11-16: 3.375 g via INTRAVENOUS
  Filled 2018-11-16 (×2): qty 50

## 2018-11-16 MED ORDER — PIPERACILLIN-TAZOBACTAM IN DEX 2-0.25 GM/50ML IV SOLN
2.2500 g | Freq: Three times a day (TID) | INTRAVENOUS | Status: DC
  Administered 2018-11-16 – 2018-11-19 (×10): 2.25 g via INTRAVENOUS
  Filled 2018-11-16 (×13): qty 50

## 2018-11-16 NOTE — Progress Notes (Signed)
Pt admitted to unit approx 0045 with respiratory, rapid response, and Schorr, NP at bedside. On Bipap with increased work of breathing, altered mentation and agitation. Attempted foley catheter with no success, met resistance. Condom cath applied, pt also incontinent of stool.  Critical Troponin results called to K. Schorr, Np Pt continues with increased agitation and intermittent episodes of apnea and solemnance. However pt will follow commands and stable sBP in the 90-100s. Spouse at bedside and provided with updates from nurse, respiratory and PCCM. Signed off to dayshift RN

## 2018-11-16 NOTE — Progress Notes (Signed)
Unable to collect urine sample this time, pt is not able to urinate since am, bladder scan shows 90 cc this time, will recheck again in few hours

## 2018-11-16 NOTE — Progress Notes (Addendum)
Shift event note: Notified just after midnight by bedside RN regarding pt c/o chest, abd pain and back pain w/ increased WOB. RR RN  and RRT were paged and responded to bedside. At bedside pt noted very dypsnic. RR varies from 30-50 w/ 02 sats WNL on NRB. Sats 80-100% proir to NRB. Pt appears to be dypsnic at times and then apnic at times. Skin cool and clammy. He responds to voice but is very lethargic w/ periods of restlessness. EKG appears to have some ST/T wave changes in lateral leads. Dr Hal Hope reviewed EKG as well. BBS diminished but otherwise CTA. Pt placed on BiPAP at bedside and transferred to SDU. SBP 80-90's, HR-70's, remains afebrile. ABG returned > 7.53/pC02 (undetectable)/530/14. CXR, Troponin, CMET, CBC, BC x 2 and urine culture ordered. Assessment/Plan: 1. Dypsnea: Etiology not clear. Concern for cardiac event given recent history. Discussed pt w/ Dr Emmit Alexanders w/ [facility] who agreed to have PCCM NP/MD come evaluate pt. Once at bedside PCCM providers made changes to BiPAP after ABG results. Initial plan was to transfer pt to ICU but decision made to leave in SDU for now as pt appears to be tolerating BiPAP. At the recommendation of PCCM pt started on IV Zosyn.Given pt's recent h/o NSTEMI, spoke w/ Dr Paticia Stack w/ cardiology service who reviewed note and noted that recent work-up for NSTEMI and CHF (EF 30%) cardiology team decided pt not a candidate for aggressive intervention and it was decided to treat pt medically. High sensitivity troponin had been trending down from an original 4,359 on 11/09/18. 799 on 11/14/2018. Follow CXR, Troponin, CBC, CMET and cultures. Spoke w/ pt's wife ([name]) by phone to update regarding pt's change in status. Wife does not seem to appreciate pt's poor prognosis and is unable to make decision regarding code status. She does plan to come see pt. Perhaps Palliative Care consult in order to help wife w/ [place] decision making. Appreciate PCCM input.  Jeryl Columbia,  NP-C [facility] Pager [phone]  CRITICAL CARE Performed by: Jeryl Columbia   Total critical care time: 120 minutes  Critical care time was exclusive of separately billable procedures and treating other patients.  Critical care was necessary to treat or prevent imminent or life-threatening deterioration.  Critical care was time spent personally by me on the following activities: development of treatment plan with patient and/or surrogate as well as nursing, discussions with consultants, evaluation of patient's response to treatment, examination of patient, obtaining history from patient or surrogate, ordering and performing treatments and interventions, ordering and review of laboratory studies, ordering and review of radiographic studies, pulse oximetry and re-evaluation of patient's condition.

## 2018-11-16 NOTE — Consult Note (Signed)
Consultation Note Date: 11/16/2018   Patient Name: [name]  DOB: 1935/02/13  MRN: [id number]  Age / Sex: 84 y.o., male  PCP: No primary care provider on file. Referring Physician: Lavina Hamman, MD  Reason for Consultation: Establishing goals of care  HPI/Patient Profile: 84 y.o. male  with past medical history of CAD, recent NSTEMI, CHF (EF 30%), CKD, PUD, HTN, HLD, prostate cancer, and dementia admitted on 11/24/2018 with abdominal pain and chest pain. Patient diagnosed with acute on chronic heart failure and is not a candidate for any interventions given his AKI and dementia. He is requiring bipap due to respiratory failure. Also has AKI with creatinine rising, no urine output today per RN. PMT consulted for [place].   Clinical Assessment and Goals of Care: I have reviewed medical records including EPIC notes, labs and imaging, received report from RN and Dr. Posey Pronto - they tell me patient becomes apneic when bipap is removed, he is lethargic, and has had no urine output today.   I then spoke with patient's wife, [name],  to discuss diagnosis prognosis, [place], EOL wishes, disposition and options.  Initially patient's wife was incredibly anxious - difficult to have conversation - I offered to call her back at a better time but she requested to continue the conversation. She became much more calm and we were able to have the following discussion.   We discussed a brief life review of the patient. She tells me they have been married for 54 years. She tells me about their faith in God.   Wife also shares significant amount of stress the family has been under for the past year - with the death of multiple loved ones (2 of them were young). She tells me all of this stress and now the illness of her husband is very difficult to cope with.   We discussed his current illness and what it means in the larger context of his on-going co-morbidities.  Natural  disease trajectory and expectations at EOL were discussed. We specifically discussed heart failure, kidney failure, and respiratory failure. She tells me many times it does not all make sense to her. Answered multiple questions. She expressed better understanding of situation.   I attempted to elicit values and goals of care important to the patient. She tells me her goal is for the patient to come home. She shares "someone mentioned hospice, and I am not interested in that".   We discussed that patient is very ill, discussed concern about poor prognosis. She expresses understanding.   She is not at a place where she is ready to transition to comfort care - she would like to continue current measures. I did make a recommendation to not attempt resuscitation if the patient experienced an arrest - she tells me she agrees with this. She tells me he would never want to be on a ventilator. She confirmed changing his code status to DNR. She also adds that patient would not want a feeding tube.   She is agreeable to follow up with palliative medicine tomorrow.  Questions and concerns were addressed. The family was encouraged to call with questions or concerns.   Primary Decision Maker NEXT OF KIN - [name] Fetterman    SUMMARY OF RECOMMENDATIONS   - code status changed to DNR, wife in agreement - poor prognosis discussed, wife remains hopeful patient will improve - continue current measures per wife's request - PMT will follow closely - planned to speak with wife tomorrow  Code  Status/Advance Care Planning:  DNR   Symptom Management:   Per primary  Palliative Prophylaxis:   Aspiration, Delirium Protocol, Frequent Pain Assessment and Turn Reposition  Additional Recommendations (Limitations, Scope, Preferences):  No Artificial Feeding  Psycho-social/Spiritual:   Desire for further Chaplaincy support:no  Additional Recommendations: Education on Hospice  Prognosis:   Unable to determine  - suspect days w/o significant improvement  Discharge Planning: To Be Determined - w/o sig improvement, anticipate hospital death      Primary Diagnoses: Present on Admission: . Acute kidney injury (AKI) with acute tubular necrosis (ATN) (HCC) . Acute on chronic combined systolic and diastolic CHF (congestive heart failure) ([place]) . Hypertensive heart disease with chronic combined systolic and diastolic congestive heart failure ([place]) . Hyperlipidemia, unspecified . Dementia ([place]) . 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 The following aspects are pertinent.  Past Medical History:  Diagnosis Date  . CAD (coronary artery disease)    a. CABG (2003)  b. 9/17/6: NSTEMI s/p overlapping DES x2 to LAD and DESx1 to SVG-->OM   . Carotid artery disease ([place])    a. 09/2013: carotid dopplers with stable 1-39% ICA stenosis bilaterally  //  b. Carotid US 3/01: RICA 31-43%; LICA 8-88% >> FU 1 year // c. Carotid US 09/2016: Bilateral ICA 1-39 >> follow-up 1 year  //  d. Carotid US 5/19: Bilateral ICA 1-39; decreased flow in right subclavian artery   . CHF (congestive heart failure) ([place])   . Chronic kidney disease (CKD), stage III (moderate) (HCC)   . Dementia ([place])   . History of echocardiogram    Echo 4/18: mild conc LVH, EF 35-40, diff HK, anteroseptal and inf-septal HK, Gr 2 DD, mild AS (mean 13), mod AI, mildly dilated asc Ao (43 mm), mod MAC, mod MR, severe LAE, mild TR, mod PI, PASP 43  . Hyperlipidemia   . Hypertension   . Internal hemorrhoids   . Ischemic cardiomyopathy    a. 2D ECHO 01/28/15 w/ EF 25-30%, severe HK of the basal-midinferoseptal myocardium, basal-midinferior myocardium and midanteroseptal myocardium. Akinesis of apical inferior and apicalanterior myocardium. G2DD. Mild AS, Asc aorta diameter 59m. Mild dilation of aortic  root. Mild MR, mild PR and PA peak pressure 36 mmg HG.  . Peptic ulcer disease   . Prostate CA ([facility]    a. s/p seed implants   Social History   Socioeconomic History  . Marital status: Married    Spouse name: Not on file  . Number of children: Not on file  . Years of education: Not on file  . Highest education level: Not on file  Occupational History  . Occupation: Retired  SScientific laboratory technician . Financial resource strain: Not on file  . Food insecurity    Worry: Not on file    Inability: Not on file  . Transportation needs    Medical: Not on file    Non-medical: Not on file  Tobacco Use  . Smoking status: Former SResearch scientist (life sciences) . Smokeless tobacco: Never Used  Substance and Sexual Activity  . Alcohol use: No  . Drug use: No  . Sexual activity: Not on file  Lifestyle  . Physical activity    Days per week: Not on file    Minutes per session: Not on file  . Stress: Not on file  Relationships  . Social connections  Talks on phone: Not on file    Gets together: Not on file    Attends religious service: Not on file    Active member of club or organization: Not on file    Attends meetings of clubs or organizations: Not on file    Relationship status: Not on file  Other Topics Concern  . Not on file  Social History Narrative  . Not on file   Family History  Problem Relation Age of Onset  . Stroke Father    Scheduled Meds: . aspirin  300 mg Rectal Daily  . heparin  5,000 Units Subcutaneous Q8H  . sodium chloride flush  3 mL Intravenous Q12H   Continuous Infusions: . piperacillin-tazobactam (ZOSYN)  IV 2.25 g (11/16/18 1305)   PRN Meds:.acetaminophen **OR** acetaminophen, fentaNYL (SUBLIMAZE) injection, nitroGLYCERIN, ondansetron **OR** ondansetron (ZOFRAN) IV Allergies  Allergen Reactions  . Ramipril Swelling and Other (See Comments)    Angioedema   . Statins Other (See Comments)    Elevated LFTs with statins in the past  . Latex Itching and Rash  . Coreg [Carvedilol]  Itching and Other (See Comments)    CANNOT TAKE MYLAN BRAND due to fillers (patient uses Auro[employer] Brand) Hallucinations Altered mental status    Vital Signs: BP 98/70   Pulse 81   Temp 97.7 F (36.5 C) (Axillary)   Resp (!) 23   Ht 5' 3.5" (1.613 m)   Wt 56.5 kg   SpO2 100%   BMI 21.71 kg/m  Pain Scale: 0-10   Pain Score: 0-No pain   SpO2: SpO2: 100 % O2 Device:SpO2: 100 % O2 Flow Rate: .O2 Flow Rate (L/min): 8 L/min  IO: Intake/output summary:   Intake/Output Summary (Last 24 hours) at 11/16/2018 1919 Last data filed at 11/16/2018 1616 Gross per 24 hour  Intake 0 ml  Output 0 ml  Net 0 ml    LBM: Last BM Date: 12/02/2018 Baseline Weight: Weight: 59 kg Most recent weight: Weight: 56.5 kg     Palliative Assessment/Data: PPS 10%    The above conversation was completed via telephone due to the visitor restrictions during the COVID-19 pandemic. Thorough chart review and discussion with necessary members of the care team was completed as part of assessment. All issues were discussed and addressed but no physical exam was performed.  Time Total: 70 minutes Greater than 50%  of this time was spent counseling and coordinating care related to the above assessment and plan.  Juel Burrow, DNP, AGNP-C Palliative Medicine Team [phone] Pager: [phone]

## 2018-11-16 NOTE — Consult Note (Addendum)
Cardiology Consultation:   Patient ID: [name] MRN: [id number]; DOB: November 21, 1934  Admit date: 11/20/2018 Date of Consult: 11/16/2018  Primary Care Provider: No primary care provider on file. Primary Cardiologist: Mertie Moores, MD Primary Electrophysiologist:  None   Patient Profile:   [name] is a 84 y.o. male with a hx of CAD s/p CABG in 2003 and PCI to native LAD and SVG-OM in 2016, chronic combined systolic and diastolic HF, aortic regurgitation and aortic stenosis, HLD intolerant to statins, carotid artery disease, HTN, stage IV CKD, advanced dementia, Prostate CA and hx med non-compliance who is being seen today for assistance managing his chronic systolic CHF at the request of Dr. Evangeline Gula,  Internal Medicine.   History of Present Illness:   [name] is a 84 y.o. male with a hx of CAD s/p CABG in 2003 and PCI to native LAD and SVG-OM in 7846, chronic systolic HF, aortic regurgitation and aortic stenosis, HLD intolerant to statins, carotid artery disease, HTN, stage IV CKD, Prostate CA, advanced dementia and hx med non-compliance who is being seen today for assistance managing his chronic systolic CHF at the request of Dr. Evangeline Gula,  Internal Medicine.   As outlined above, he has h/o CABG. His last cath was in 2016 and showed severe 3 vessel CAD with a chronically occluded LCx, RCA and severe subtotal occlusion of the native mid LAD, chronically occluded SVG-RCA and SVG-LAD with severe stenosis and SVG-OM. He underwent PCI with DES 2 in overlapping fashion to the native LAD as well as DES to the SVG-OM. He was recently admitted on 11/09/18 for acute on chronic combined systolic and diastolic CHF. Admit BPN was 12,000 and CXR showed pulmonary edema. He was treated with IV diuretics. Cardiology was consulted on 6/30 for CP and elevated troponin. He was seen by Dr. Meda Coffee in consultation. Per documentation, he was confused but some history was obtained from his wife. He apparently had  been doing fairly well until just prior to admit when he developed intermittent, nonexertional CP, described as retrosternal pressure-like sensation accompanied by exertional dyspnea. A time of examination, he was CP free and appeared comfortable. His Hs Troponin rose to 4,500 and showed downward trend to 4,300. His SCr was 1.8 and discussion was held with family and decision was made not to pursue cardiac cath due to resolved CP and abnormal renal function and family's desire to avoid HD. Medical therapy was recommended as well as continued diuresis for acute CHF. Of note, he also had a 2D echo that showed reduced EF, 30%, severe hypokinesis of the inferior and  inferolateral walls, akinesis of the apex and mild MR. He was also noted to have aortic valve disease w/ severe calcifcation of the aortic valve. Aortic valve regurgitation is mild by color flow Doppler. Visually, there appear to be at least moderate aortic stenosis but mean gradient only 9 mmHg. Possible low gradient  moderate or severe aortic stenosis could not be ruled out. He was discharged home on 11/11/18. His SCr day of discharge was 1.76. he was discharged home on 40 mg PO Lasix, Imdur, Coreg, ASA and Zetia.   Pt presented back to the [facility] ED on 11/12/2018 with generalized abdominal pain, but no nausea or vomiting. He was found to have worsening renal function, with SCr up from 1.76 previous admit to 2.82 in the course of 4 days. SCr has further increased to 3.83 today. K is WNL at 3.9. Initial CXR showed improved aeration of both lungs in  comparison to prior CXR on previous admit. Stable cardiomegaly w/o failure was noted by radiologist. COVID test was negative. Initial Hs Troponin on 7/6 was 850, felt to be down trending from prior admission, however troponins have been cycled and trending back upward, now at 1,931.  His EKGs this admit show NSR with lateral TW depressions in V4-V6, more pronounced from prior admit. His lasix was held due to AKI and  he was gently hydrated.   Early morning hours today, 7/7, he developed increased SOB and rapid response was called. He was hypoxic and started on BiPAP. He also developed more confusion and more abdominal pain. He had a repeat CXR that showed cardiomegaly with right pleural effusion and possible vascular congestion. Abdominal CT showed probable gallbladder sludge or small stones ([place] recommended), no bowel obstruction, normal appendix, mild diffuse mesenteric edema and trace fluid within the pelvis, cardiomegaly and moderate sized plueral effusion. PCCM was consulted. Based on assessment, they felt he had an acute metabolic encephalopathy and respiratory alkalosis. They initiated broad spectrum antibiotics and drew blood cultures and recommended continuation of BiPAP and to continue to hold diuresis and follow renal function. They also recommended palliative care consult (not yet completed).   On my examination today, he remains on BiPAP. Nurse tech is present at beside and has been with him most of the day. He is somnolent but opens eyes with sternal rub. Nurse tech reports he has been sleeping most of the day and did not get much restful sleep overnight. He has not complained of any pain today and has not appeared agitated or in any distress. He remains borderline hypotensive w/ systolic pressures in the low 90s. NSR on tele. No arrhthymias. No peripheral edema on exam.   Heart Pathway Score:     Past Medical History:  Diagnosis Date   CAD (coronary artery disease)    a. CABG (2003)  b. 9/17/6: NSTEMI s/p overlapping DES x2 to LAD and DESx1 to SVG-->OM    Carotid artery disease ([place])    a. 09/2013: carotid dopplers with stable 1-39% ICA stenosis bilaterally  //  b. Carotid US 3/54: RICA 56-25%; LICA 6-38% >> FU 1 year // c. Carotid US 09/2016: Bilateral ICA 1-39 >> follow-up 1 year  //  d. Carotid US 5/19: Bilateral ICA 1-39; decreased flow in right subclavian artery    CHF (congestive heart failure)  (HCC)    Chronic kidney disease (CKD), stage III (moderate) (HCC)    Dementia (HCC)    History of echocardiogram    Echo 4/18: mild conc LVH, EF 35-40, diff HK, anteroseptal and inf-septal HK, Gr 2 DD, mild AS (mean 13), mod AI, mildly dilated asc Ao (43 mm), mod MAC, mod MR, severe LAE, mild TR, mod PI, PASP 43   Hyperlipidemia    Hypertension    Internal hemorrhoids    Ischemic cardiomyopathy    a. 2D ECHO 01/28/15 w/ EF 25-30%, severe HK of the basal-midinferoseptal myocardium, basal-midinferior myocardium and midanteroseptal myocardium. Akinesis of apical inferior and apicalanterior myocardium. G2DD. Mild AS, Asc aorta diameter 4m. Mild dilation of aortic root. Mild MR, mild PR and PA peak pressure 36 mmg HG.   Peptic ulcer disease    Prostate CA ([place]    a. s/p seed implants    Past Surgical History:  Procedure Laterality Date   CARDIAC CATHETERIZATION N/A 01/27/2015   Procedure: Left Heart Cath and Coronary Angiography;  Surgeon: MWellington Hampshire MD;  [facility];  Service: Cardiovascular;  Laterality: N/A;   CARDIAC SURGERY  2003   SVG LAD, SVG to right coronary artery, SVG to marginal     Home Medications:  Prior to Admission medications   Medication Sig Start Date End Date Taking? Authorizing Provider  aspirin EC 81 MG tablet Take 1 tablet (81 mg total) by mouth daily. 08/03/17  Yes End, Harrell Gave, MD  carvedilol (COREG) 6.25 MG tablet Take 6.25 mg by mouth 2 (two) times daily with a meal.   Yes [provider]  ezetimibe (ZETIA) 10 MG tablet Take 1 tablet (10 mg total) by mouth daily. 10/26/18  Yes Weaver, Scott T, PA-C  furosemide (LASIX) 40 MG tablet Take 1 tablet (40 mg total) by mouth 2 (two) times daily. 11/11/18  Yes Kathie Dike, MD  isosorbide mononitrate (IMDUR) 30 MG 24 hr tablet Take 1 tablet (30 mg total) by mouth daily. 09/29/18  Yes Weaver, Scott T, PA-C  loratadine (CLARITIN) 10 MG tablet Take 10 mg by mouth daily.   Yes  [provider]  nitroGLYCERIN (NITROSTAT) 0.4 MG SL tablet place 1 tablet under the tongue if needed every 5 minutes for chest pain  UP TO 3 DOSES Patient taking differently: Place 0.4 mg under the tongue every 5 (five) minutes as needed for chest pain. UP TO 3 DOSES 04/19/18   [name] T, PA-C    Inpatient Medications: Scheduled Meds:  aspirin  300 mg Rectal Daily   heparin  5,000 Units Subcutaneous Q8H   sodium chloride flush  3 mL Intravenous Q12H   Continuous Infusions:  piperacillin-tazobactam (ZOSYN)  IV 2.25 g (11/16/18 1305)   PRN Meds: acetaminophen **OR** acetaminophen, fentaNYL (SUBLIMAZE) injection, nitroGLYCERIN, ondansetron **OR** ondansetron (ZOFRAN) IV  Allergies:    Allergies  Allergen Reactions   Ramipril Swelling and Other (See Comments)    Angioedema    Statins Other (See Comments)    Elevated LFTs with statins in the past   Latex Itching and Rash   Coreg [Carvedilol] Itching and Other (See Comments)    CANNOT TAKE MYLAN BRAND due to fillers (patient uses Aurobindo Brand) Hallucinations Altered mental status    Social History:   Social History   Socioeconomic History   Marital status: Married    Spouse name: Not on file   Number of children: Not on file   Years of education: Not on file   Highest education level: Not on file  Occupational History   Occupation: Retired  Scientist, product/process development strain: Not on file   Food insecurity    Worry: Not on file    Inability: Not on Lexicographer needs    Medical: Not on file    Non-medical: Not on file  Tobacco Use   Smoking status: Former Smoker   Smokeless tobacco: Never Used  Substance and Sexual Activity   Alcohol use: No   Drug use: No   Sexual activity: Not on file  Lifestyle   Physical activity    Days per week: Not on file    Minutes per session: Not on file   Stress: Not on file  Relationships   Social connections    Talks on  phone: Not on file    Gets together: Not on file    Attends religious service: Not on file    Active member of club or organization: Not on file    Attends meetings of clubs or organizations: Not on file    Relationship status:  Not on file   Intimate partner violence    Fear of current or ex partner: Not on file    Emotionally abused: Not on file    Physically abused: Not on file    Forced sexual activity: Not on file  Other Topics Concern   Not on file  Social History Narrative   Not on file    Family History:    Family History  Problem Relation Age of Onset   Stroke Father      ROS:  Please see the history of present illness.   All other ROS reviewed and negative.     Physical Exam/Data:   Vitals:   11/16/18 1048 11/16/18 1102 11/16/18 1200 11/16/18 1300  BP: (!) 97/35 (!) 91/24 (!) 100/58 99/64  Pulse: 85 84 83 76  Resp: (!) 26 11 (!) 33 (!) 22  Temp:  (!) 97.5 F (36.4 C)    TempSrc:  Axillary    SpO2: 100% 99% 100% 100%  Weight:      Height:        Intake/Output Summary (Last 24 hours) at 11/16/2018 1330 Last data filed at 11/16/2018 0800 Gross per 24 hour  Intake 240 ml  Output 0 ml  Net 240 ml   Last 3 Weights 12/06/2018 11/22/2018 11/11/2018  Weight (lbs) 124 lb 8 oz 130 lb 129 lb 3.2 oz  Weight (kg) 56.473 kg 58.968 kg 58.605 kg     Body mass index is 21.71 kg/m.  General:  Elderly AAM, thin appearing, somnolent but opens eyes with sternal rub, on Bipap HEENT: normal Lymph: no adenopathy Neck: no JVD Endocrine:  No thryomegaly Vascular: No carotid bruits; FA pulses 2+ bilaterally without bruits  Cardiac:  RRR 2/6 systolic murmur, best heard at LUSB Lungs:  Decreased BS on the right side Abd: soft, nontender, no hepatomegaly  Ext: no edema Musculoskeletal:  No deformities, BUE and BLE strength normal and equal Skin: warm and dry  Neuro:  CNs 2-12 intact, no focal abnormalities noted Psych:  Normal affect   EKG:  The EKG was personally reviewed  and demonstrates:  NSR with lateral TW inversions, more pronounced compared to previous Telemetry:  Telemetry was personally reviewed and demonstrates:  NSR. HR in the 90s. No adverse arrthymias   Relevant CV Studies: 2D Echo 11/10/2018 IMPRESSIONS    1. The left ventricle had a visually estimated ejection fraction of of 30%. The cavity size was mildly dilated. There is mildly increased left ventricular wall thickness. Indeterminant diastolic function. Severe hypokinesis of the inferior and  inferolateral walls, akinesis of the apex.  2. The mitral valve is degenerative. Mild calcification of the mitral valve leaflet. There is mild mitral annular calcification present. Mitral valve regurgitation is mild to moderate by color flow Doppler. No evidence of mitral valve stenosis.  3. The aortic valve is tricuspid Severe calcifcation of the aortic valve. Aortic valve regurgitation is mild by color flow Doppler. Visually, there appear to be at least moderate aortic stenosis but mean gradient only 9 mmHg. Possible low gradient  moderate or severe aortic stenosis. Low dose dobutamine stress echo may help sort this out.  4. The IVC was normal in size. No complete TR doppler jet so unable to estimate PA systolic pressure.  Laboratory Data:  High Sensitivity Troponin:   Recent Labs  Lab 11/09/18 1654 11/09/18 1854 12/10/2018 0958 11/12/2018 1153 11/16/18 0334  TROPONINIHS 4,554* 4,359* 850* 799* 1,931*     Cardiac EnzymesNo results for input(s): TROPONINI  in the last 168 hours. No results for input(s): TROPIPOC in the last 168 hours.  Chemistry Recent Labs  Lab 11/11/18 0350 11/22/2018 0958 11/16/18 0334  NA 140 139 138  K 3.8 3.7 3.9  CL 101 99 98  CO2 25 20* 17*  GLUCOSE 97 136* 197*  BUN 35* 65* 89*  CREATININE 1.76* 2.82* 3.83*  CALCIUM 9.4 9.2 8.9  GFRNONAA 35* 20* 14*  GFRAA 41* 23* 16*  ANIONGAP 14 20* 23*    Recent Labs  Lab 11/26/2018 0958 11/16/18 0334  PROT 7.1 6.9  ALBUMIN  3.4* 3.2*  AST 64* 84*  ALT 80* 98*  ALKPHOS 105 109  BILITOT 1.9* 1.6*   Hematology Recent Labs  Lab 11/11/18 0810 12/05/2018 0958 11/16/18 0131  WBC 6.4 7.2 8.6  RBC 4.94 4.39 4.64  HGB 14.0 12.5* 13.2  HCT 43.6 38.6* 41.1  MCV 88.3 87.9 88.6  MCH 28.3 28.5 28.4  MCHC 32.1 32.4 32.1  RDW 14.6 14.9 15.3  PLT 185 182 140*   BNP Recent Labs  Lab 11/09/18 1804  BNP 1,709.4*    DDimer No results for input(s): DDIMER in the last 168 hours.   Radiology/Studies:  Ct Abdomen Pelvis Wo Contrast  Result Date: 11/20/2018 CLINICAL DATA:  84 year old male with abdominal pain. EXAM: CT ABDOMEN AND PELVIS WITHOUT CONTRAST TECHNIQUE: Multidetector CT imaging of the abdomen and pelvis was performed following the standard protocol without IV contrast. COMPARISON:  CT of the abdomen pelvis dated 04/05/2010 FINDINGS: Evaluation of this exam is limited in the absence of intravenous contrast. Evaluation is also limited due to paucity of intra-abdominal fat as well as streak artifact caused by metallic overlying support device. Lower chest: Partially visualized moderate appearing right pleural effusion. There is associated partial compressive atelectasis of the visualized right lower lobe. Pneumonia is not excluded. There is mild cardiomegaly, new since the prior CT. No intra-abdominal free air. Trace free fluid within the pelvis. Hepatobiliary: The liver is grossly unremarkable. Faint high attenuating content within the gallbladder most compatible with sludge or small stones. Apparent haziness of the gallbladder wall. Further evaluation with ultrasound is recommended to exclude acute cholecystitis. Pancreas: The pancreas is suboptimally visualized but appears unremarkable as visualized. Spleen: Normal in size without focal abnormality. Adrenals/Urinary Tract: The adrenal glands are unremarkable. The kidneys, visualized ureters, and urinary bladder are unremarkable. Stomach/Bowel: Evaluation of the bowel is  limited in the absence of oral contrast. There is no bowel dilatation or definite evidence of obstruction. There is mild diffuse mesenteric edema and small fluid in the paracolic gutter. The appendix is normal. Vascular/Lymphatic: Advanced aortoiliac atherosclerotic disease. Evaluation of the vasculature is very limited in the absence of intravenous contrast. No portal venous gas. No adenopathy seen. Reproductive: Prostate brachytherapy seeds. Other: Interval decrease in the subcutaneous fat since the prior CT. Musculoskeletal: Degenerative changes of the spine. Grade 1 L2-L3 retrolisthesis. Multilevel disc desiccation and vacuum phenomena most prominent at L3-L3. No acute osseous pathology. IMPRESSION: 1. Probable gallbladder sludge or small stones. Ultrasound is recommended for better evaluation of the gallbladder. 2. No bowel obstruction.  Normal appendix. 3. Mild diffuse mesenteric edema and trace fluid within the pelvis. 4. Cardiomegaly, new since the prior CT. 5. Partially visualized moderate right pleural effusion with associated partial compressive atelectasis of the visualized right lower lobe. Pneumonia is not excluded. 6. Overall loss of intra-abdominal and subcutaneous fat since the prior CT. Underlying malignancy is not excluded. Clinical correlation is recommended. Electronically Signed   By:  Anner Crete M.D.   On: 12/08/2018 13:47   Dg Chest 2 View  Result Date: 11/12/2018 CLINICAL DATA:  Generalized chest pain and abdominal pain. EXAM: CHEST - 2 VIEW COMPARISON:  Two-view chest x-ray 11/09/2018 FINDINGS: The heart is enlarged. Previously noted pulmonary vascular congestion has resolved. There are no significant effusions. Aeration is improved at both bases. There is some residual medial right lower lobe opacity. IMPRESSION: 1. Improved aeration of both lungs. 2. Stable cardiomegaly without failure. Electronically Signed   By: San Morelle M.D.   On: 12/02/2018 10:25   Dg Chest Port  1 View  Result Date: 11/16/2018 CLINICAL DATA:  Shortness of breath and chest pain. EXAM: PORTABLE CHEST 1 VIEW COMPARISON:  Radiograph yesterday. FINDINGS: Patient had difficulty tolerating the exam, rotation despite being held for images. Post median sternotomy with grossly stable cardiomegaly. Possible vascular congestion. No evidence of focal airspace disease. Right pleural effusion with blunting of costophrenic angle no pneumothorax. Nodular density projecting over the right mid lung is likely nipple shadow. IMPRESSION: Technically limited study due to positioning. Cardiomegaly with right pleural effusion. Possible vascular congestion. Electronically Signed   By: Keith Rake M.D.   On: 11/16/2018 01:40   US Abdomen Limited Ruq  Result Date: 11/16/2018 CLINICAL DATA:  Elevated liver enzymes EXAM: ULTRASOUND ABDOMEN LIMITED RIGHT UPPER QUADRANT COMPARISON:  CT abdomen and pelvis November 15, 2018 FINDINGS: Gallbladder: No gallstones are evident. There is sludge in the gallbladder. The gallbladder wall is thickened and somewhat edematous in appearance. No pericholecystic fluid evident. No sonographic Murphy sign noted by sonographer. Common bile duct: Diameter: 4 mm. No intrahepatic or extrahepatic biliary duct dilatation evident. Liver: No focal lesion identified. Within normal limits in parenchymal echogenicity. Portal vein is patent on color Doppler imaging with normal direction of blood flow towards the liver. There is evidence of a right pleural effusion. There is mild ascites. IMPRESSION: 1. Gallbladder wall is thickened and edematous. There is sludge in the gallbladder but no gallstones. The appearance of the gallbladder does raise concern for potential acalculous cholecystitis. Given this circumstance, it may be prudent to consider nuclear medicine hepatobiliary imaging study to assess for cystic duct patency. Note that the patient does have mild ascites which potentially may cause gallbladder wall  thickening. 2.  Right pleural effusion. 3.  Mild ascites. Electronically Signed   By: Lowella Grip III M.D.   On: 11/16/2018 11:13    Assessment and Plan:   Laden Fieldhouse is a 84 y.o. male with a hx of CAD s/p CABG in 2003 and PCI to native LAD and SVG-OM in 2016, chronic combined systolic and diastolic HF, aortic regurgitation and aortic stenosis, HLD intolerant to statins, carotid artery disease, HTN, stage IV CKD, advanced dementia, Prostate CA and hx med non-compliance who is being seen today for assistance managing his chronic systolic CHF at the request of Dr. Evangeline Gula,  Internal Medicine.     1. Chronic Combined Systolic and Diastolic CHF: Known reduced EF at 30%. Recent admission last week for acute on chronic combined s/d CHF. Diuresed w/ IV Lasix and sent home w/ PO Lasix, 40 mg daily. No ARB due to CKD. Was on Coreg and Imdur. Presented back 7/6 w/ abdominal pain and nausea and worsening renal function/ AKI with SCr now up to 3. Initial CXR showed no pulmonary edema and no pleural effusion. Lasix held on admit and he was given IVFs w/ subsequent development of hypoxic respiratory failure and right sided pleural effusion, requiring  Bipap. HF meds now on hold due to hypotension. Systolic pressures in the low 90s. No LEE noted on exam but some abdominal edema was noted on CT scan. This may be cardiorenal syndrome. Given his advancedage and advanced dementia, he may not be a suitable candidate for AHF therapies. Can consider dose of IV Lasix to see response but this could potentially lead to worsening renal function. Consider palliative care consult, as previously advised by PCCM.   2. CAD w/ Elevated Troponin: hx of CAD s/p CABG in 2003 and PCI to native LAD and SVG-OM in 2016. He has had recent CP and elevated Hs Troponin, previous admit up to 4,500 (1,900 this admit). EKG shows lateral ST depressions and recent echo 7/1 showed WMAs w/ severe hypokinesis of the inferior and inferolateral walls,  akinesis of the apex. Based on history and objective findings, it is likely that he has suffered a recent Type I NSTEMI, however given his advanced dementia and worsening renal function w/ family's desire to avoid HD, we have recommended medical therapy. Will not plan to pursue cardiac cath. His cardiac meds are currently on hold due to hypotension and need for continuous BiPAP.   3. Acute on Chronic Renal Failure: renal function has worsened over the course of 1 week. His SCr on 7/1 was 1.7. This has increased to 2.82 on 7/6 and further increased to 3.83 today. He has been on PO Lasix, 40 mg daily, for management of chronic systolic and diastolic HF. This is now on hold.  He received gentle IV hydration on admit, but no longer  Getting continuous  fluids. Now/ with increased dyspnea and rt sided pleural effusion requiring BiPAP. He would benefit, from a HF standpoint, from more lasix for diuresis, but this could result in worsening renal function.   4. Aortic Stenosis: recent 2D echo 11/10/18 showed reduced EF at 30% and  severe calcifcation of the aortic valve. Aortic valve regurgitation is mild by color flow Doppler. Visually, there appears to be at least moderate aortic stenosis but mean gradient only 9 mmHg. Possible low gradient  moderate or severe aortic stenosis could not be ruled out. Given his other medical problems, including advanced dementia and worsening renal function and desire to avoid HD, he is not a candidate for further w/u to exclude severe AS. No plans for dobutamine stress test nor cath at this time.    For questions or updates, please contact [place] Please consult [email or web address] for contact info under    Signed, Lyda Jester, PA-C  11/16/2018 1:30 PM   History and all data above reviewed.  Patient examined.  I agree with the findings as above.   The patient is confused.  He does answer questions and indicates some substernal chest pain.  However, he cannot quantify or  qualify this.  He wants his BiPAP and mittens taken off.  His extensive recent and current history are documented as above.  He has respiratory failure complicated by hypotension and renal insufficiency.  He has multiple organs involved and his family has not wanted dialysis given advanced comorbid conditions such as dementia.  The patient exam reveals COR:RRR  ,  Lungs: Clear  ,  Abd: Positive bowel sounds, no rebound no guarding, Ext No edema  .  All available labs, radiology testing, previous records reviewed. Agree with documented assessment and plan.   Acute systolic and diastolic HF:  Volume is likely not treatable without need for dialysis.  Goals of care need to be  discussed with the family and Palliative Care.  He likely has demand ischemia but invasive evaluation is not an option without resultant renal failure.  It is possible that he could have new obstructive CAD although, looking at the last cath, I think repeat cath with be of limited value.  Medical management is very limited.  For tonight he is oxygenating well with BiPAP and his BP is relatively stable.  I will await the goals of care discussion.  Jeneen Rinks Trevaun Rendleman  5:10 PM  11/16/2018

## 2018-11-16 NOTE — Progress Notes (Signed)
RT called by RN to place pt back on Bi-PAP. Pt having multiple episodes of sleep apnea. Pt placed back on BiPAP 10/5. Will continue to monitor.

## 2018-11-16 NOTE — Progress Notes (Signed)
CRITICAL VALUE ALERT  Critical Value: Lactic Acid 3.0  Date & Time Notied:  11/16/2018 1945  Provider Notified: TRH Schorr  Orders Received/Actions taken: Awaiting

## 2018-11-16 NOTE — Consult Note (Addendum)
NAME:  [name], MRN:  [id number], DOB:  May 30, 1934, LOS: 0 ADMISSION DATE:  11/30/2018, CONSULTATION DATE:  7/7 REFERRING MD:  Dr. Evangeline Gula, CHIEF COMPLAINT:  Dyspnea, AMS   Brief History   84 year old male with multiple medical problems admitted with renal failure and proceeded to develop respiratory distress requiring BiPAP.  History of present illness   84 year old male with PMH as below, which is significant for CAD s/p CABG, CKD III, Dementia, HTN, HFrEF (LVEF 30%), and Prostate Ca. He was recently admitted to [facility] for NSTEMI and CHF exacerbation. Due to his CKD it was decided that he should not undergo cardiac cath. He was managed medically and diuresed. Discharged on lasix. He presented again to [facility] ED 7/6 with complaints of abdominal/chest pain. Troponin was up, but was felt to be trending down from his previous admission. CT abdomen concerning for perhaps gall bladder sludge, but no clear evidence of cholecystitis. He was admitted for observation due to elevated serum creatinine in the setting of increased diuresis. Lasix was held and he was gently hydrated.   Then in the early AM hours of 7/7 he developed nausea and abdominal pain, somewhat resolved with Zofran. He then developed increased SOB and rapid response was called. He was hypoxic and was started on BiPAP. He also began to develop some confusion. PCCM consulted.   Past Medical History   has a past medical history of CAD (coronary artery disease), Carotid artery disease ([place]), CHF (congestive heart failure) ([place]), Chronic kidney disease (CKD), stage III (moderate) ([place]), Dementia ([place]), History of echocardiogram, Hyperlipidemia, Hypertension, Internal hemorrhoids, Ischemic cardiomyopathy, Peptic ulcer disease, and Prostate CA ([place]).   Significant Hospital Events   7/6 admit for AKI on CKD 7/7 hypoxia, AMS > tx to SDU on BiPAP.   Consults:    Procedures:    Significant Diagnostic Tests:  CT abd/pelv 7/6 >  Probable gallbladder sludge or small stones. Ultrasound is recommended for better evaluation of the gallbladder. No bowel obstruction.  Normal appendix. Mild diffuse mesenteric edema and trace fluid within the pelvis. moderate right pleural effusion with associated partial compressive atelectasis of the visualized right lower lobe. Pneumonia is not excluded.  Micro Data:  Blood 7/7 > Urine 7/7  > Sputum 7/7 >  Antimicrobials:  Zosyn 7/7 > Vanco 7/7 >  Interim history/subjective:    Objective   Blood pressure (!) 87/59, pulse 87, temperature 97.7 F (36.5 C), temperature source Oral, resp. rate (!) 38, height 5' 3.5" (1.613 m), weight 56.5 kg, SpO2 100 %.        Intake/Output Summary (Last 24 hours) at 11/16/2018 0255 Last data filed at 11/13/2018 1847 Gross per 24 hour  Intake 240 ml  Output -  Net 240 ml   Filed Weights   11/21/2018 0915 12/06/2018 1831  Weight: 59 kg 56.5 kg    Examination: General: frail elderly male, agitated.  HENT: [place]/AT,PERRL, no JVD Lungs: Clear bilateral. Tachypneic. BiPAP.  Cardiovascular: Tachy, regular Abdomen: Soft, non-tender, non-distended Extremities: No acute deformity or ROM limitation.  Neuro: Disoriented, agitated, pulling at lines.   Resolved Hospital Problem list     Assessment & Plan:   Dyspnea: Etiology unclear. Seems to be oxygenating and ventilating well on ABG. IPAP decreased based on ABG. EKG with some ST/T changes. EKG was reviewed by Cardiology, who did not see an indication for any intervention given decisions made on prior admission.  Plan: - Trial off BiPAP - Follow CXR  Pleural effusion: R sided effusion with associated atelectasis vs PNA. Also has known EF of 30% so may be volume related.  Plan: - Monitor - If does not improve may benefit from thoracentesis - Empiric abx r/o PNA.   Acute metabolic encephalopathy seems to have gotten worse since BiPAP started. Respiratory alkalosis on ABG. History of dementia, but  wife reports this is unknown to her. He does not sundown at home. Plan:  - Close monitoring - Trial off BiPAP  - Consider low dose haldol.   AKI on CKD III:  - continue to hold diuresis given AKI and borderline hypotension - Follow BMP  Continue to monitor in ICU  Best practice:  Diet: NPO Pain/Anxiety/Delirium protocol (if indicated): NA VAP protocol (if indicated): NA DVT prophylaxis: per primary GI prophylaxis: Per primary Glucose control: Per primary Mobility: BR Code Status: FULL Family Communication: Wife updated. Not prepared to discuss [place].  Disposition: SDU for now. Low threshold for ICU transfer.   Labs   CBC: Recent Labs  Lab 11/09/18 1654 11/11/18 0810 11/10/2018 0958 11/16/18 0131  WBC 6.2 6.4 7.2 8.6  NEUTROABS  --   --  3.7  --   HGB 12.5* 14.0 12.5* 13.2  HCT 38.4* 43.6 38.6* 41.1  MCV 87.1 88.3 87.9 88.6  PLT 183 185 182 140*    Basic Metabolic Panel: Recent Labs  Lab 11/09/18 1654 11/09/18 1804 11/10/18 0141  11/10/18 1125 11/10/18 1444 11/10/18 1811 11/11/18 0350 11/29/2018 0958  NA 135 136 139  --   --   --   --  140 139  K 6.4* 5.3* 3.6   < > 3.9 3.9 3.9 3.8 3.7  CL 103 103 104  --   --   --   --  101 99  CO2 23 22 24   --   --   --   --  25 20*  GLUCOSE 108* 101* 138*  --   --   --   --  97 136*  BUN 33* 33* 33*  --   --   --   --  35* 65*  CREATININE 1.81* 1.80* 1.73*  --   --   --   --  1.76* 2.82*  CALCIUM 8.7* 8.7* 8.8*  --   --   --   --  9.4 9.2   < > = values in this interval not displayed.   GFR: Estimated Creatinine Clearance: 16.1 mL/min (A) (by C-G formula based on SCr of 2.82 mg/dL (H)). Recent Labs  Lab 11/09/18 1654 11/11/18 0810 11/22/2018 0958 11/16/18 0131  WBC 6.2 6.4 7.2 8.6    Liver Function Tests: Recent Labs  Lab 12/10/2018 0958  AST 64*  ALT 80*  ALKPHOS 105  BILITOT 1.9*  PROT 7.1  ALBUMIN 3.4*   Recent Labs  Lab 12/06/2018 0958  LIPASE 55*   No results for input(s): AMMONIA in the last 168  hours.  ABG    Component Value Date/Time   PHART 7.548 (H) 11/16/2018 0115   PCO2ART PENDING 11/16/2018 0115   PO2ART PENDING 11/16/2018 0115   HCO3 14.0 (L) 11/16/2018 0115   ACIDBASEDEF 8.2 (H) 11/16/2018 0115   O2SAT 100.0 11/16/2018 0115     Coagulation Profile: Recent Labs  Lab 11/09/18 1654  INR 1.2    Cardiac Enzymes: No results for input(s): CKTOTAL, CKMB, CKMBINDEX, TROPONINI in the last 168 hours.  HbA1C: Hgb A1c MFr Bld  Date/Time Value Ref Range Status  01/27/2015 09:34 AM 6.2 (  H) 4.8 - 5.6 % Final    Comment:    (NOTE)         Pre-diabetes: 5.7 - 6.4         Diabetes: >6.4         Glycemic control for adults with diabetes: <7.0     CBG: Recent Labs  Lab 12/03/2018 2321  GLUCAP 138*    Review of Systems:   Unable as patient is encephalopathic.   Past Medical History  He,  has a past medical history of CAD (coronary artery disease), Carotid artery disease ([place]), CHF (congestive heart failure) ([place]), Chronic kidney disease (CKD), stage III (moderate) ([place]), Dementia ([place]), History of echocardiogram, Hyperlipidemia, Hypertension, Internal hemorrhoids, Ischemic cardiomyopathy, Peptic ulcer disease, and Prostate CA ([place]).   Surgical History    Past Surgical History:  Procedure Laterality Date  . CARDIAC CATHETERIZATION N/A 01/27/2015   Procedure: Left Heart Cath and Coronary Angiography;  Surgeon: Wellington Hampshire, MD;  Location: [facility];  Service: Cardiovascular;  Laterality: N/A;  . CARDIAC SURGERY  2003   SVG LAD, SVG to right coronary artery, SVG to marginal     Social History   reports that he has quit smoking. He has never used smokeless tobacco. He reports that he does not drink alcohol or use drugs.   Family History   His family history includes Stroke in his father.   Allergies Allergies  Allergen Reactions  . Ramipril Swelling and Other (See Comments)    Angioedema   . Statins Other (See Comments)    Elevated LFTs with  statins in the past  . Latex Itching and Rash  . Coreg [Carvedilol] Itching and Other (See Comments)    CANNOT TAKE MYLAN BRAND due to fillers (patient uses Aurobindo Brand) Hallucinations Altered mental status     Home Medications  Prior to Admission medications   Medication Sig Start Date End Date Taking? Authorizing Provider  aspirin EC 81 MG tablet Take 1 tablet (81 mg total) by mouth daily. 08/03/17  Yes End, Harrell Gave, MD  carvedilol (COREG) 6.25 MG tablet Take 6.25 mg by mouth 2 (two) times daily with a meal.   Yes [provider]  ezetimibe (ZETIA) 10 MG tablet Take 1 tablet (10 mg total) by mouth daily. 10/26/18  Yes Weaver, Scott T, PA-C  furosemide (LASIX) 40 MG tablet Take 1 tablet (40 mg total) by mouth 2 (two) times daily. 11/11/18  Yes Kathie Dike, MD  isosorbide mononitrate (IMDUR) 30 MG 24 hr tablet Take 1 tablet (30 mg total) by mouth daily. 09/29/18  Yes Weaver, Scott T, PA-C  loratadine (CLARITIN) 10 MG tablet Take 10 mg by mouth daily.   Yes [provider]  nitroGLYCERIN (NITROSTAT) 0.4 MG SL tablet place 1 tablet under the tongue if needed every 5 minutes for chest pain  UP TO 3 DOSES Patient taking differently: Place 0.4 mg under the tongue every 5 (five) minutes as needed for chest pain. UP TO 3 DOSES 04/19/18   Liliane Shi, PA-C     Critical care time:     Georgann Housekeeper, [facility] [place] Pager [phone] or [phone]  11/16/2018 3:53 AM

## 2018-11-16 NOTE — Telephone Encounter (Signed)
Left message for pt to call back about covid prescreening for in office appt.

## 2018-11-16 NOTE — Progress Notes (Signed)
Pt returned to unit from CT. Currently on 3L [place]. Sats 100 percent. Will continue to monitor.

## 2018-11-16 NOTE — Progress Notes (Signed)
Patient called wife from room phone and RN spoke with patient wife.  She was given patient room phone number and the number to [facility].

## 2018-11-16 NOTE — Progress Notes (Signed)
Pt taken off BIPAP and placed on [place] for transfer to CT. Pt off unit accompanied by Respiratory.

## 2018-11-16 NOTE — Progress Notes (Addendum)
Pharmacy Antibiotic Note  [name] is a 84 y.o. male admitted on 12/05/2018 with abdominal pain.  Pharmacy has been consulted for Zosyn dosing. WBC WNL. Noted renal dysfunction. Pt decompensated last night, renal fxn worsen overnight 2.82>>3.83.  Plan: Reduce Zosyn to 2.25g Q8H Trend WBC, temp, renal function  F/U infectious work-up  Height: 5' 3.5" (161.3 cm) Weight: 124 lb 8 oz (56.5 kg) IBW/kg (Calculated) : 58.05  Temp (24hrs), Avg:97.5 F (36.4 C), Min:97.4 F (36.3 C), Max:97.7 F (36.5 C)  Recent Labs  Lab 11/09/18 1654 11/09/18 1804 11/10/18 0141 11/11/18 0350 11/11/18 0810 12/07/2018 0958 11/16/18 0131 11/16/18 0334  WBC 6.2  --   --   --  6.4 7.2 8.6  --   CREATININE 1.81* 1.80* 1.73* 1.76*  --  2.82*  --  3.83*    Estimated Creatinine Clearance: 11.9 mL/min (A) (by C-G formula based on SCr of 3.83 mg/dL (H)).    Allergies  Allergen Reactions  . Ramipril Swelling and Other (See Comments)    Angioedema   . Statins Other (See Comments)    Elevated LFTs with statins in the past  . Latex Itching and Rash  . Coreg [Carvedilol] Itching and Other (See Comments)    CANNOT TAKE MYLAN BRAND due to fillers (patient uses Aurobindo Brand) Hallucinations Altered mental status   Narda Bonds, PharmD, BCPS Clinical Pharmacist Phone: [phone]

## 2018-11-16 NOTE — Progress Notes (Signed)
Patient called out for assistance.  Patient stated that he did not feel right.  Upon further evaluation patient stated he felt as though he needed to have a bowel movement and was having some nausea.  Patient helped to bedside commode by NT and RN got zofran for patient.  After zofran patient still did not feel well and was becoming very restless stating he felt like he was not breathing fast enough.  Patient was tachypneic.  VS were stable with BP being 91/59.  RN notified rapid response nurse and respiratory therapist who came to look at patient.  Patient transferred to [facility] and wife notified.

## 2018-11-16 NOTE — Progress Notes (Signed)
[facility] Progress Note  Patient: [name] HDQ:[id number]   PCP: No primary care provider on file. DOB: 12/08/1934   DOA: 11/22/2018   DOS: 11/16/2018   Date of Service: the patient was seen and examined on 11/16/2018  Brief hospital course: Pt. with PMH of CAD, recent non-STEMI medically managed, chronic systolic CHF, CKD 3, GERD/PUD, HTN; admitted on 11/17/2018, presented with complaint of shortness of breath abdominal pain nausea, was found to have acute on chronic systolic CHF, acute hypoxic respiratory failure, recurrent non-STEMI, AKI, elevated LFT. Currently further plan is continue current care with BiPAP, IV antibiotics and supportive measures and continue to engage with family to discuss goals of care.  Ideally patient will be served better by being on comfort measures only.  Subjective: Minimally responsive, not following any commands.  Incoherent speech.  Hard of hearing.  No vomiting reported.  Urine output significantly less.  Unable to tolerate being off of BiPAP and becomes apneic.  Assessment and Plan: 1.  Acute on chronic combined systolic and diastolic CHF. Severe aortic stenosis. CAD with elevated troponin. Recent non-STEMI. Patient presents with complaints of shortness of breath abdominal pain or nausea. Troponins trending up from [phone]. Known reduced 30% EF.  Echocardiogram this admission continues to show 35% EF. Patient discharged with higher dose of Lasix which wife was continuing but is concerned that the Lasix is actually causing more shortness of breath. Cardiology consulted for further assistance. Not a candidate for any intervention given his acute kidney injury and advanced dementia. Prognosis remains poor and guarded. Unable to tolerate anything p.o. due to being on BiPAP. Aspirin suppository ordered. Anticoagulation and diuresis will be deferred to cardiology. Family's choice is to continue current care.  2.  Acute kidney injury on chronic kidney  disease stage III. Anion gap metabolic acidosis Baseline serum creatinine around 1.7. Currently serum creatinine is 3.8. Urine output is less than 400 cc in the whole day.  Bladder scan is less than 90 cc. No evidence of obstruction based on CT scan. Discussed with nephrology on the phone.  Patient not a candidate for dialysis.  Monitor for now. ABG shows respiratory alkalosis and metabolic acidosis.  CO2 worsening anion gap of 20 through.  Check lactic acid but suspect this is all secondary to acute kidney injury. No formal consultation.  3.  Acute hypoxic respiratory failure. Recurrent episodes of apnea. Right-sided pleural effusion Possible pneumonia Continue with IV antibiotics for now. May require thoracentesis of the patient and family continues to pursue aggressive care although this appears to be secondary to heart failure and therefore will recur for sure. Patient does have episodes of apnea which is likely secondary to his encephalopathy. Continue BiPAP support for now. Unable to tolerate high flow nasal cannula. Prognosis very poor.  4.  Acute metabolic encephalopathy. Advance dementia Patient does have baseline dementia and appears to have worsened further lately. Difficult examination but no focal deficit identified Monitor for now. Prognosis remains guarded. Currently n.p.o. with concern for aspiration as well as unable to hold off the BiPAP.Marland Kitchen  5.  Elevated LFT. Abdominal pain Acalculous cholecystitis. Mesenteric edema mild ascites Right upper quadrant ultrasound shows evidence of possible acalculous cholecystitis. Not a candidate for HIDA scan for now. On IV Zosyn.  6.  Goals of care discussion. Difficult situation. Patient has multiple organ failure right now with acute on chronic kidney injury, acute elevation of LFT, acute elevation of troponin with acute on chronic combined CHF, acute metabolic encephalopathy with baseline history of  dementia and acute  hypoxic respiratory failure requiring BiPAP for survival. I have had extensive discussion with patient's wife who is at bedside and patient's daughter on the phone on 11/16/2018. Explained patient's current condition and future poor prognosis and dependence on BiPAP ventilation. I anticipate that the patient will not survive this hospitalization. Wife is still hopeful for patient's recovery and is surprised with patient's rapid decline.  I explained that acute MI last admission can certainly cause a rapid decline in patient's condition. Palliative care consulted. Appreciate their assistance. Wife has selected DNR/DNI for the patient. I recommended comfort approach at the time of my meeting with patient's wife and daughter. Continue palliative care support  Diet: NPO secondary to being on BiPAP DVT Prophylaxis: Subcutaneous Heparin   Advance goals of care discussion: DNR/DNI-see above  Family Communication: family was present at bedside, at the time of interview. The pt provided permission to discuss medical plan with the family. Opportunity was given to ask question and all questions were answered satisfactorily.   Disposition:  Discharge to To be determined .  Consultants: Palliative care PCCM, cardiology. Procedures: Echocardiogram   Scheduled Meds:  aspirin  300 mg Rectal Daily   heparin  5,000 Units Subcutaneous Q8H   sodium chloride flush  3 mL Intravenous Q12H   Continuous Infusions:  piperacillin-tazobactam (ZOSYN)  IV 2.25 g (11/16/18 1305)   PRN Meds: acetaminophen **OR** acetaminophen, fentaNYL (SUBLIMAZE) injection, nitroGLYCERIN, ondansetron **OR** ondansetron (ZOFRAN) IV Antibiotics: Anti-infectives (From admission, onward)   Start     Dose/Rate Route Frequency Ordered Stop   11/16/18 1400  piperacillin-tazobactam (ZOSYN) IVPB 2.25 g     2.25 g 100 mL/hr over 30 Minutes Intravenous Every 8 hours 11/16/18 1042     11/16/18 0200  piperacillin-tazobactam (ZOSYN)  IVPB 3.375 g  Status:  Discontinued     3.375 g 12.5 mL/hr over 240 Minutes Intravenous Every 8 hours 11/16/18 0156 11/16/18 1042      Objective: Physical Exam: Vitals:   11/16/18 1300 11/16/18 1400 11/16/18 1532 11/16/18 1616  BP: 99/64 112/60 101/67 98/70  Pulse: 76 77 77 72  Resp: (!) 22 16 (!) 22 (!) 28  Temp:   97.7 F (36.5 C)   TempSrc:   Axillary   SpO2: 100% 100% 100% 100%  Weight:      Height:        Intake/Output Summary (Last 24 hours) at 11/16/2018 1712 Last data filed at 11/16/2018 0800 Gross per 24 hour  Intake 240 ml  Output 0 ml  Net 240 ml   Filed Weights   11/10/2018 0915 11/17/2018 1831  Weight: 59 kg 56.5 kg   General: obtunded and not oriented to time, place, and person. Appear in severe distress, affect unresponsive Eyes: PERRL, Conjunctiva normal ENT: Oral Mucosa Clear, dry  Neck: difficult to assess  JVD, no Abnormal Mass Or lumps Cardiovascular: S1 and S2 Present, aortic systolic  Murmur, peripheral pulses symmetrical Respiratory: increased respiratory effort, Bilateral Air entry equal and Decreased, positive use of accessory muscle, bilateral  Crackles, Occasional  wheezes Abdomen: Bowel Sound present, Soft and mild tenderness, no hernia Skin: no rashes  Extremities: no Pedal edema, no calf tenderness Neurologic: Difficult to assess complete examination secondary to patient's poor participation Gait not checked due to patient safety concerns  Data Reviewed: CBC: Recent Labs  Lab 11/11/18 0810 11/16/2018 0958 11/16/18 0131  WBC 6.4 7.2 8.6  NEUTROABS  --  3.7  --   HGB 14.0 12.5* 13.2  HCT 43.6  38.6* 41.1  MCV 88.3 87.9 88.6  PLT 185 182 631*   Basic Metabolic Panel: Recent Labs  Lab 11/09/18 1804 11/10/18 0141  11/10/18 1444 11/10/18 1811 11/11/18 0350 12/06/2018 0958 11/16/18 0334  NA 136 139  --   --   --  140 139 138  K 5.3* 3.6   < > 3.9 3.9 3.8 3.7 3.9  CL 103 104  --   --   --  101 99 98  CO2 22 24  --   --   --  25 20* 17*    GLUCOSE 101* 138*  --   --   --  97 136* 197*  BUN 33* 33*  --   --   --  35* 65* 89*  CREATININE 1.80* 1.73*  --   --   --  1.76* 2.82* 3.83*  CALCIUM 8.7* 8.8*  --   --   --  9.4 9.2 8.9   < > = values in this interval not displayed.    Liver Function Tests: Recent Labs  Lab 11/29/2018 0958 11/16/18 0334  AST 64* 84*  ALT 80* 98*  ALKPHOS 105 109  BILITOT 1.9* 1.6*  PROT 7.1 6.9  ALBUMIN 3.4* 3.2*   Recent Labs  Lab 11/14/2018 0958  LIPASE 55*   No results for input(s): AMMONIA in the last 168 hours. Coagulation Profile: No results for input(s): INR, PROTIME in the last 168 hours. Cardiac Enzymes: No results for input(s): CKTOTAL, CKMB, CKMBINDEX, TROPONINI in the last 168 hours. BNP (last 3 results) Recent Labs    10/05/18 1555  PROBNP 12,132*   CBG: Recent Labs  Lab 11/18/2018 2321  GLUCAP 138*   Studies: Dg Chest Port 1 View  Result Date: 11/16/2018 CLINICAL DATA:  Shortness of breath and chest pain. EXAM: PORTABLE CHEST 1 VIEW COMPARISON:  Radiograph yesterday. FINDINGS: Patient had difficulty tolerating the exam, rotation despite being held for images. Post median sternotomy with grossly stable cardiomegaly. Possible vascular congestion. No evidence of focal airspace disease. Right pleural effusion with blunting of costophrenic angle no pneumothorax. Nodular density projecting over the right mid lung is likely nipple shadow. IMPRESSION: Technically limited study due to positioning. Cardiomegaly with right pleural effusion. Possible vascular congestion. Electronically Signed   By: Keith Rake M.D.   On: 11/16/2018 01:40   US Abdomen Limited Ruq  Result Date: 11/16/2018 CLINICAL DATA:  Elevated liver enzymes EXAM: ULTRASOUND ABDOMEN LIMITED RIGHT UPPER QUADRANT COMPARISON:  CT abdomen and pelvis November 15, 2018 FINDINGS: Gallbladder: No gallstones are evident. There is sludge in the gallbladder. The gallbladder wall is thickened and somewhat edematous in appearance.  No pericholecystic fluid evident. No sonographic Murphy sign noted by sonographer. Common bile duct: Diameter: 4 mm. No intrahepatic or extrahepatic biliary duct dilatation evident. Liver: No focal lesion identified. Within normal limits in parenchymal echogenicity. Portal vein is patent on color Doppler imaging with normal direction of blood flow towards the liver. There is evidence of a right pleural effusion. There is mild ascites. IMPRESSION: 1. Gallbladder wall is thickened and edematous. There is sludge in the gallbladder but no gallstones. The appearance of the gallbladder does raise concern for potential acalculous cholecystitis. Given this circumstance, it may be prudent to consider nuclear medicine hepatobiliary imaging study to assess for cystic duct patency. Note that the patient does have mild ascites which potentially may cause gallbladder wall thickening. 2.  Right pleural effusion. 3.  Mild ascites. Electronically Signed   By: Lowella Grip  III M.D.   On: 11/16/2018 11:13     Time spent: The patient is critically ill with multiple organ systems failure and requires high complexity decision making for assessment and support, frequent evaluation and titration of therapies. Critical Care Time devoted to patient care services described in this note is 35 minutes   Author: Berle Mull, MD [facility] 11/16/2018 5:12 PM  To reach On-call, see care teams to locate the attending and reach out to them via [email or web address]. If 7PM-7AM, please contact night-coverage If you still have difficulty reaching the attending provider, please page the [facility] (Director on Call) for [facility] on amion for assistance.

## 2018-11-16 NOTE — Progress Notes (Signed)
Received call regarding patient when he was on 3E10 from RN.  Nikki, RN with [facility] was also in room when I arrived.  Patient appeared lethargic at times.  BS were clear but very diminished, no crackles heard.  Patient would be breathing fine one minute, but then have periods apnea.  Lexine Baton called MD, Belenda Cruise, NP with [facility] responded and suggested placing patient on Bipap and getting an ABG.  Patient was also moved to 2C15 at that time.  ABG was difficult to get, this RT tried Prudencio Pair, RRT tried X3, John Day, RRT finally was able to get ABG which showed pH 7.53, CO2 was so low it was undetectable, PaO2 was 530, Bicarb at 14.  These were given to Georgann Housekeeper, CCM, and Belenda Cruise, NP with [facility].  Changed FIO2 to 30% after ABG results.  Plan is to keep patient on Bipap at this time and monitor.  Georgann Housekeeper, NP with CCM changed Bipap settings to 12/5.  The FIO2 still at 30%, rate of 12.  Will continue to monitor.

## 2018-11-16 NOTE — Progress Notes (Signed)
Pharmacy Antibiotic Note  [name] is a 84 y.o. male admitted on 11/17/2018 with abdominal pain.  Pharmacy has been consulted for Zosyn dosing. WBC WNL. Noted renal dysfunction. Pt decompensated tonight. Per [facility], CCM recommends Zosyn monotherapy for now.   Plan: Zosyn 3.375G IV q8h to be infused over 4 hours Trend WBC, temp, renal function  F/U infectious work-up  Height: 5' 3.5" (161.3 cm) Weight: 124 lb 8 oz (56.5 kg) IBW/kg (Calculated) : 58.05  Temp (24hrs), Avg:97.7 F (36.5 C), Min:97.5 F (36.4 C), Max:97.9 F (36.6 C)  Recent Labs  Lab 11/09/18 1654 11/09/18 1804 11/10/18 0141 11/11/18 0350 11/11/18 0810 12/01/2018 0958  WBC 6.2  --   --   --  6.4 7.2  CREATININE 1.81* 1.80* 1.73* 1.76*  --  2.82*    Estimated Creatinine Clearance: 16.1 mL/min (A) (by C-G formula based on SCr of 2.82 mg/dL (H)).    Allergies  Allergen Reactions  . Ramipril Swelling and Other (See Comments)    Angioedema   . Statins Other (See Comments)    Elevated LFTs with statins in the past  . Latex Itching and Rash  . Coreg [Carvedilol] Itching and Other (See Comments)    CANNOT TAKE MYLAN BRAND due to fillers (patient uses Aurobindo Brand) Hallucinations Altered mental status   Narda Bonds, PharmD, BCPS Clinical Pharmacist Phone: [phone]

## 2018-11-16 NOTE — Significant Event (Signed)
[facility] Event Note  Overview:  Called by RN stating pt with increased SOB and labored breathing. Pt c/o pain to stomach and chest as well and nausea. Zofran given at 2245.  Instructed to call RT and I was on my way.  Initial Focused Assessment: On arrival, pt restless in the bed moaning in pain. Alert and oriented with periods of somnolence noted. HR 78, BP 91/59, RR 30s, spO2 ranging between 82-100%. Lungs sound clear/diminished. CBG 138.   Interventions: EKG, called [facility] NP to come look at pt. Bipap, 0.5 Ativan given, Abg (7.5/undetectable/500's/14) CXR, Transferred to 2C15. STAT Labs, blood cultures, abx, CCM consulted, safety sitter ordered. [facility] called wife and updated on pt status.   Pt lethargic with periods of restlessness. BP soft at times, RR ranging between 30-50. Hx dementia, possible sundowning? Order to transfer to ICU cancelled for the moment d/t bed/staffing shortages and since pt does not need ICU care/interventions at this time. Plan of Care (if not transferred): Continue to monitor pt on Bipap (FiO2 decreased to 30%), safety sitter at bedside to prevent pulling lines and getting OOB. Pt cool to touch, instructed RN to place warm blankets and decrease stimulation to let pt rest. Bedside RN and charge RN encouraged to call with any changes or concerns as well as calling e-link (pt room is e-link monitored). Will follow up on pending labs and pts status.  Event Summary:  Called at  2314    Event ended at  [facility]

## 2018-11-16 NOTE — Progress Notes (Signed)
Removed from Bi-PAP to HFNC at 8L per MD.

## 2018-11-17 DIAGNOSIS — R945 Abnormal results of liver function studies: Secondary | ICD-10-CM

## 2018-11-17 DIAGNOSIS — J90 Pleural effusion, not elsewhere classified: Secondary | ICD-10-CM

## 2018-11-17 DIAGNOSIS — N179 Acute kidney failure, unspecified: Secondary | ICD-10-CM

## 2018-11-17 DIAGNOSIS — N183 Chronic kidney disease, stage 3 (moderate): Secondary | ICD-10-CM

## 2018-11-17 DIAGNOSIS — E7800 Pure hypercholesterolemia, unspecified: Secondary | ICD-10-CM

## 2018-11-17 LAB — CBC WITH DIFFERENTIAL/PLATELET
Abs Immature Granulocytes: 0.02 10*3/uL (ref 0.00–0.07)
Basophils Absolute: 0 10*3/uL (ref 0.0–0.1)
Basophils Relative: 0 %
Eosinophils Absolute: 0 10*3/uL (ref 0.0–0.5)
Eosinophils Relative: 1 %
HCT: 36.4 % — ABNORMAL LOW (ref 39.0–52.0)
Hemoglobin: 12.3 g/dL — ABNORMAL LOW (ref 13.0–17.0)
Immature Granulocytes: 0 %
Lymphocytes Relative: 22 %
Lymphs Abs: 1.7 10*3/uL (ref 0.7–4.0)
MCH: 29.1 pg (ref 26.0–34.0)
MCHC: 33.8 g/dL (ref 30.0–36.0)
MCV: 86.1 fL (ref 80.0–100.0)
Monocytes Absolute: 1 10*3/uL (ref 0.1–1.0)
Monocytes Relative: 13 %
Neutro Abs: 5 10*3/uL (ref 1.7–7.7)
Neutrophils Relative %: 64 %
Platelets: 178 10*3/uL (ref 150–400)
RBC: 4.23 MIL/uL (ref 4.22–5.81)
RDW: 15.1 % (ref 11.5–15.5)
WBC: 7.8 10*3/uL (ref 4.0–10.5)
nRBC: 0.4 % — ABNORMAL HIGH (ref 0.0–0.2)

## 2018-11-17 LAB — COMPREHENSIVE METABOLIC PANEL
ALT: 212 U/L — ABNORMAL HIGH (ref 0–44)
AST: 191 U/L — ABNORMAL HIGH (ref 15–41)
Albumin: 3.1 g/dL — ABNORMAL LOW (ref 3.5–5.0)
Alkaline Phosphatase: 94 U/L (ref 38–126)
Anion gap: 19 — ABNORMAL HIGH (ref 5–15)
BUN: 103 mg/dL — ABNORMAL HIGH (ref 8–23)
CO2: 19 mmol/L — ABNORMAL LOW (ref 22–32)
Calcium: 8.5 mg/dL — ABNORMAL LOW (ref 8.9–10.3)
Chloride: 101 mmol/L (ref 98–111)
Creatinine, Ser: 4.54 mg/dL — ABNORMAL HIGH (ref 0.61–1.24)
GFR calc Af Amer: 13 mL/min — ABNORMAL LOW (ref >60)
GFR calc non Af Amer: 11 mL/min — ABNORMAL LOW (ref >60)
Glucose, Bld: 124 mg/dL — ABNORMAL HIGH (ref 70–99)
Potassium: 4.1 mmol/L (ref 3.5–5.1)
Sodium: 139 mmol/L (ref 135–145)
Total Bilirubin: 1.4 mg/dL — ABNORMAL HIGH (ref 0.3–1.2)
Total Protein: 6.5 g/dL (ref 6.5–8.1)

## 2018-11-17 LAB — LACTIC ACID, PLASMA: Lactic Acid, Venous: 2.5 mmol/L (ref 0.5–1.9)

## 2018-11-17 LAB — MAGNESIUM: Magnesium: 2.7 mg/dL — ABNORMAL HIGH (ref 1.7–2.4)

## 2018-11-17 MED ORDER — MORPHINE 100MG IN NS 100ML (1MG/ML) PREMIX INFUSION: 0.5000 mg/h | INTRAVENOUS | Status: DC

## 2018-11-17 MED ORDER — FENTANYL CITRATE (PF) 100 MCG/2ML IJ SOLN
25.0000 ug | INTRAMUSCULAR | Status: DC | PRN
  Administered 2018-11-17 – 2018-11-19 (×10): 25 ug via INTRAVENOUS
  Filled 2018-11-17 (×10): qty 2

## 2018-11-17 MED ORDER — DEXTROSE-NACL 5-0.9 % IV SOLN
INTRAVENOUS | Status: DC
  Administered 2018-11-17: 21:00:00 via INTRAVENOUS

## 2018-11-17 NOTE — Progress Notes (Signed)
Patient tried to get out of the bed while changing shift. Safety sitter was at the bedside at that time. He wanted to go to bathroom, explained patient has condom cath. Patient had orientation x 2 ( self, place). Continuously SpO2 down to 60's -70's with good wave form. Paging RT regarding putting on the BIPAP. SpO2 went up. Patient makes urine, but not much. Patient pulled out BIPAP and applied high flow. Patient wanted to have water, but pt's NPO status due to BIPAP. Gave mouth care, patient was okay with it.  SpO2 was desaturated again and applied BIPAP. Patient's wife called for asking pt's condition. Explained patient's conditions and need to set up the goal of care with palliative team. She was willing to talk to palliative team. Called Palliative team regarding this matter and talking about comfort care. She talked wife then called back this nurse. Wife didn't want to be comfort care. Talked this matter with Dr. Sherral Hammers since wife was in the pt's room. She misunderstood Dr. Sherral Hammers saying and got upset. Dr. Sherral Hammers talked with pt's daughter regarding situations and pt's condition. She understood well because she talked with doctor yesterday long time regarding this situation. Made wife to be calm in the room.  Asked assistance of director Dawn regarding patient wife is staying in the patient's room. It is okay this case because he is dementia and confused without sitter. Patient is resting now and much calmer than in the morning. Continue to monitor SpO2 and still NPO status. HS [employer]

## 2018-11-17 NOTE — Progress Notes (Addendum)
CRITICAL VALUE ALERT  Critical Value:  Lactic Acid 2.5  Date & Time Notied:  11/17/2018 0411  Provider Notified: [facility]   Schorr  Orders Received/Actions taken: Awaiting

## 2018-11-17 NOTE — Progress Notes (Signed)
PROGRESS NOTE    [name]  WYO:[id number] DOB: 05/26/1934 DOA: 11/29/2018 PCP: No primary care provider on file.   Brief Narrative:  84 y.o. BM PMHx NSTEMI discharge 11/11/2018, CHF, HTN, CKD stage III, peptic ulcer disease, sensitivity [name] requiring a special preparation,   Presents the emergency department initially complaining of left sided upper abdominal and lower chest pain that began this morning.  His pain is sharp and intermittent and radiates to his left upper back.  He had associated nausea but no vomiting.  Symptoms feel a little bit similar to his previous ED visit when he was diagnosed with a myocardial infarction.  He has no associated shortness of breath, cough, diarrhea, constipation, urinary symptoms, or fever.  Patient discharged from our facility on seven 06/2018 after admission for non-STEMI and congestive heart failure exacerbation.  Cardiac catheterization was not done due to risk of renal injury.  Consultation with cardiology was obtained and they recommended discharge home on Lasix twice daily.     Subjective: Patient on BiPAP, sleepy but arousable, does not follow commands.  Wife stated that she had no questions and was just speaking to the [place].  Wife stated that patient had an appointment on Thursday, at which point I stated to Mrs. Salman that the patient would make it to that appointment. Wife became  EXTREMELY AGITATED, and refused to speak with me after that point.  Began to call her children's state that I stated her husband was going to die overnight.  At that point I asked Mrs. Zietz which she wanted me to speak with 1 of her children and she handed me the phone and I spoke with her daughter Rachel Samples.    Assessment & Plan:   Principal Problem:   Acute kidney injury (AKI) with acute tubular necrosis (ATN) (HCC) Active Problems:   Hypertensive heart disease with chronic combined systolic and diastolic congestive heart failure (HCC)   Hyperlipidemia,  unspecified   History of non-ST elevation myocardial infarction (NSTEMI)   Dementia (HCC)   Acute on chronic combined systolic and diastolic CHF (congestive heart failure) (HCC)   Hypertensive heart and renal disease with both (congestive) heart failure and renal failure (HCC)   Acute metabolic encephalopathy   SOB (shortness of breath)   Chest pain   Goals of care, counseling/discussion   Palliative care by specialist  Acute on chronic systolic and diastolic CHF/severe aortic stenosis - EF 30% - Per cardiology not a candidate for any intervention secondary to increased failure of kidneys.  Per cardiology note they have explained to family that any intervention on their part would result in patient being on dialysis and family does not want dialysis. - Per cardiology NOT a candidate for valve surgery or TAVR -Strict in and out -Daily weight  CAD with elevated troponin -See CHF  Recent NSTEMI  - Patient just discharged on 7/2 for NSTEMI   Acute on CKD stage III (Cr at discharge on 7/2 was 1.76) - Hold all nephrotoxic medication - Patient's kidney function continues to worsen secondary to hypotension, hypoxia. Recent Labs  Lab 11/11/18 0350 12/07/2018 0958 11/16/18 0334 11/16/18 1902 11/17/18 0314  CREATININE 1.76* 2.82* 3.83* 4.36* 4.54*  - Gently hydrate D5W-0.9% saline 7ml/hr - Given patient's deteriorating renal status for completeness we will formally consult nephrology in a.m. if creatinine continues to climb given wife interaction with staff.  Acute respiratory failure with hypoxia - Patient unable to tolerate being off of BiPAP, as he desats into the  high 60s to low 70% immediately. - Attempted to explain this to wife however secondary to her extreme agitation doubt she heard this explanation.  However did speak with daughter who is aware of father's deteriorating condition. -PCXR pending for the a.m. - ABG pending for the a.m. -Titrate O2 to maintain SPO2> 92% to  include the use of BiPAP/HFNC  RIGHT pleural effusion - Patient with minimal pleural effusion by [facility] on 7/7.  Effusion so small not even amenable to drainage.  HCAP? - Did not appreciate pneumonia on PCXR 7/7 - Negative fever, negative leukocytosis, positive lactic acid.  Therefore will continue antibiotics for now.  Acalculous cholecystitis/mesenteric edema mild ascites - RIGHT upper quadrant ultrasound shows evidence of possible acalculous cholecystitis, unfortunately patient not a candidate for HIDA scan. - Continue current antibiotics   Elevated LFT -See cholecystitis   Acute metabolic encephalopathy -Multifactorial acute on chronic systolic and diastolic CHF/severe aortic stenosis, acute respiratory failure with hypoxia, infection - Unfortunately patient has multisystem organ failure.  See above problems  Advanced dementia -Unsure of baseline given that patient on BiPAP and somnolent.  Wife unable to articulate today patient's baseline.  .     Goals of care - Palliative care on board.  Wife with UNREASONABLE expectation does not want to hear that patient has multisystem organ failure and the likelihood of surviving this hospitalization is extremely low. - 7/8 spoke at length with daughter,Perdita Dyckman who had spoken with a physician on 7/ 7 extensively concerning her father's condition and she seemed very realistic concerning the fact that her father was not going to survive this hospitalization.  Since mother was extremely agitated requested that she be allowed to try to calm her mother down and speak further with her today on her father's condition.  Per palliative care's note wife would like to take patient home, this may be a good choice if we can actually arrange for a safe transfer to home with understanding that patient is not going to survive long.      DVT prophylaxis: Subcu heparin Code Status: DNR Family Communication: Wife and daughter Disposition Plan:  TBD   Consultants:  Cardiology PCCM   Procedures/Significant Events:     I have personally reviewed and interpreted all radiology studies and my findings are as above.  VENTILATOR SETTINGS: BiPAP   Cultures   Antimicrobials: Anti-infectives (From admission, onward)   Start     Ordered Stop   11/16/18 1400  piperacillin-tazobactam (ZOSYN) IVPB 2.25 g     11/16/18 1042     11/16/18 0200  piperacillin-tazobactam (ZOSYN) IVPB 3.375 g  Status:  Discontinued     11/16/18 0156 11/16/18 1042       Devices    LINES / TUBES:      Continuous Infusions:  piperacillin-tazobactam (ZOSYN)  IV 2.25 g (11/17/18 0547)     Objective: Vitals:   11/16/18 2300 11/17/18 0351 11/17/18 0417 11/17/18 0551  BP: 98/70  104/72   Pulse: 86 84 83 84  Resp: (!) 25 (!) 0 (!) 31 14  Temp:   97.9 F (36.6 C)   TempSrc:   Oral   SpO2: 95% 97%  97%  Weight:      Height:        Intake/Output Summary (Last 24 hours) at 11/17/2018 0808 Last data filed at 11/17/2018 0547 Gross per 24 hour  Intake 103 ml  Output 0 ml  Net 103 ml   Filed Weights   12/09/2018 0915 11/20/2018 1831  Weight: 59 kg 56.5 kg    Examination:  General: Sleepy, on BiPAP arousable, positive acute respiratory distress, cachectic Eyes: negative scleral hemorrhage, negative anisocoria, negative icterus ENT: Negative Runny nose, negative gingival bleeding, Neck:  Negative scars, masses, torticollis, lymphadenopathy, JVD Lungs: Tachypneic clear to auscultation bilaterally without wheezes or crackles Cardiovascular: Tachycardic, without murmur gallop or rub normal S1 and S2 Abdomen: negative abdominal pain, nondistended, positive soft, bowel sounds, no rebound, no ascites, no appreciable mass Extremities: No significant cyanosis, clubbing, or edema bilateral lower extremities Skin: Negative rashes, lesions, ulcers Psychiatric: Unable to assess secondary to patient being on BiPAP  Central nervous system: Spontaneously  moves all extremities opens eyes when you call his name unable to assess further secondary to being on BiPAP  .     Data Reviewed: Care during the described time interval was provided by me .  I have reviewed this patient's available data, including medical history, events of note, physical examination, and all test results as part of my evaluation.   CBC: Recent Labs  Lab 11/11/18 0810 11/22/2018 0958 11/16/18 0131 11/16/18 1902 11/17/18 0314  WBC 6.4 7.2 8.6 6.5 7.8  NEUTROABS  --  3.7  --  4.4 5.0  HGB 14.0 12.5* 13.2 11.9* 12.3*  HCT 43.6 38.6* 41.1 36.2* 36.4*  MCV 88.3 87.9 88.6 87.4 86.1  PLT 185 182 140* 159 248   Basic Metabolic Panel: Recent Labs  Lab 11/11/18 0350 12/05/2018 0958 11/16/18 0334 11/16/18 1902 11/17/18 0314  NA 140 139 138 139 139  K 3.8 3.7 3.9 4.4 4.1  CL 101 99 98 101 101  CO2 25 20* 17* 19* 19*  GLUCOSE 97 136* 197* 132* 124*  BUN 35* 65* 89* 98* 103*  CREATININE 1.76* 2.82* 3.83* 4.36* 4.54*  CALCIUM 9.4 9.2 8.9 8.6* 8.5*  MG  --   --   --  2.6* 2.7*   GFR: Estimated Creatinine Clearance: 10 mL/min (A) (by C-G formula based on SCr of 4.54 mg/dL (H)). Liver Function Tests: Recent Labs  Lab 11/16/2018 0958 11/16/18 0334 11/16/18 1902 11/17/18 0314  AST 64* 84* 222* 191*  ALT 80* 98* 206* 212*  ALKPHOS 105 109 90 94  BILITOT 1.9* 1.6* 1.3* 1.4*  PROT 7.1 6.9 6.6 6.5  ALBUMIN 3.4* 3.2* 3.1* 3.1*   Recent Labs  Lab 11/14/2018 0958  LIPASE 55*   No results for input(s): AMMONIA in the last 168 hours. Coagulation Profile: No results for input(s): INR, PROTIME in the last 168 hours. Cardiac Enzymes: No results for input(s): CKTOTAL, CKMB, CKMBINDEX, TROPONINI in the last 168 hours. BNP (last 3 results) Recent Labs    10/05/18 1555  PROBNP 12,132*   HbA1C: No results for input(s): HGBA1C in the last 72 hours. CBG: Recent Labs  Lab 11/23/2018 2321  GLUCAP 138*   Lipid Profile: No results for input(s): CHOL, HDL, LDLCALC, TRIG,  CHOLHDL, LDLDIRECT in the last 72 hours. Thyroid Function Tests: No results for input(s): TSH, T4TOTAL, FREET4, T3FREE, THYROIDAB in the last 72 hours. Anemia Panel: No results for input(s): VITAMINB12, FOLATE, FERRITIN, TIBC, IRON, RETICCTPCT in the last 72 hours. Urine analysis:    Component Value Date/Time   COLORURINE YELLOW 01/27/2015 1007   APPEARANCEUR CLOUDY (A) 01/27/2015 1007   LABSPEC 1.023 01/27/2015 1007   PHURINE 5.5 01/27/2015 1007   GLUCOSEU NEGATIVE 01/27/2015 1007   HGBUR MODERATE (A) 01/27/2015 1007   BILIRUBINUR NEGATIVE 01/27/2015 1007   KETONESUR 40 (A) 01/27/2015 1007   PROTEINUR 30 (  A) 01/27/2015 1007   UROBILINOGEN 0.2 01/27/2015 1007   NITRITE NEGATIVE 01/27/2015 1007   LEUKOCYTESUR NEGATIVE 01/27/2015 1007   Sepsis Labs: @LABRCNTIP (procalcitonin:4,lacticidven:4)  ) Recent Results (from the past 240 hour(s))  SARS Coronavirus 2 (CEPHEID - Performed in [place] hospital lab), Hosp Order     Status: None   Collection Time: 11/09/18  6:32 PM   Specimen: Nasopharyngeal Swab  Result Value Ref Range Status   SARS Coronavirus 2 NEGATIVE NEGATIVE Final    Comment: (NOTE) If result is NEGATIVE SARS-CoV-2 target nucleic acids are NOT DETECTED. The SARS-CoV-2 RNA is generally detectable in upper and lower  respiratory specimens during the acute phase of infection. The lowest  concentration of SARS-CoV-2 viral copies this assay can detect is 250  copies / mL. A negative result does not preclude SARS-CoV-2 infection  and should not be used as the sole basis for treatment or other  patient management decisions.  A negative result may occur with  improper specimen collection / handling, submission of specimen other  than nasopharyngeal swab, presence of viral mutation(s) within the  areas targeted by this assay, and inadequate number of viral copies  (<250 copies / mL). A negative result must be combined with clinical  observations, patient history, and  epidemiological information. If result is POSITIVE SARS-CoV-2 target nucleic acids are DETECTED. The SARS-CoV-2 RNA is generally detectable in upper and lower  respiratory specimens dur ing the acute phase of infection.  Positive  results are indicative of active infection with SARS-CoV-2.  Clinical  correlation with patient history and other diagnostic information is  necessary to determine patient infection status.  Positive results do  not rule out bacterial infection or co-infection with other viruses. If result is PRESUMPTIVE POSTIVE SARS-CoV-2 nucleic acids MAY BE PRESENT.   A presumptive positive result was obtained on the submitted specimen  and confirmed on repeat testing.  While 2019 novel coronavirus  (SARS-CoV-2) nucleic acids may be present in the submitted sample  additional confirmatory testing may be necessary for epidemiological  and / or clinical management purposes  to differentiate between  SARS-CoV-2 and other Sarbecovirus currently known to infect humans.  If clinically indicated additional testing with an alternate test  methodology [phone]) is advised. The SARS-CoV-2 RNA is generally  detectable in upper and lower respiratory sp ecimens during the acute  phase of infection. The expected result is Negative. Fact Sheet for Patients:  StrictlyIdeas.no Fact Sheet for Healthcare Providers: BankingDealers.co.za This test is not yet approved or cleared by the [place] FDA and has been authorized for detection and/or diagnosis of SARS-CoV-2 by FDA under an Emergency Use Authorization (EUA).  This EUA will remain in effect (meaning this test can be used) for the duration of the COVID-19 declaration under Section 564(b)(1) of the Act, 21 U.S.C. section 360bbb-3(b)(1), unless the authorization is terminated or revoked sooner. Performed at [facility], [place] [address], [place], [place] [address]   SARS  Coronavirus 2 (Performed in [facility] hospital lab)     Status: None   Collection Time: 12/10/2018  4:35 PM   Specimen: Nasal Swab  Result Value Ref Range Status   SARS Coronavirus 2 NEGATIVE NEGATIVE Final    Comment: (NOTE) SARS-CoV-2 target nucleic acids are NOT DETECTED. The SARS-CoV-2 RNA is generally detectable in upper and lower respiratory specimens during the acute phase of infection. Negative results do not preclude SARS-CoV-2 infection, do not rule out co-infections with other pathogens, and should not be  used as the sole basis for treatment or other patient management decisions. Negative results must be combined with clinical observations, patient history, and epidemiological information. The expected result is Negative. Fact Sheet for Patients: SugarRoll.be Fact Sheet for Healthcare Providers: https://www.-mathews.com/ This test is not yet approved or cleared by the [place] FDA and  has been authorized for detection and/or diagnosis of SARS-CoV-2 by FDA under an Emergency Use Authorization (EUA). This EUA will remain  in effect (meaning this test can be used) for the duration of the COVID-19 declaration under Section 56 4(b)(1) of the Act, 21 U.S.C. section 360bbb-3(b)(1), unless the authorization is terminated or revoked sooner. Performed at [facility], [place] [address] [place], [place] [address]          Radiology Studies: Ct Abdomen Pelvis Wo Contrast  Result Date: 11/27/2018 CLINICAL DATA:  84 year old male with abdominal pain. EXAM: CT ABDOMEN AND PELVIS WITHOUT CONTRAST TECHNIQUE: Multidetector CT imaging of the abdomen and pelvis was performed following the standard protocol without IV contrast. COMPARISON:  CT of the abdomen pelvis dated 04/05/2010 FINDINGS: Evaluation of this exam is limited in the absence of intravenous contrast. Evaluation is also limited due to paucity of intra-abdominal fat as  well as streak artifact caused by metallic overlying support device. Lower chest: Partially visualized moderate appearing right pleural effusion. There is associated partial compressive atelectasis of the visualized right lower lobe. Pneumonia is not excluded. There is mild cardiomegaly, new since the prior CT. No intra-abdominal free air. Trace free fluid within the pelvis. Hepatobiliary: The liver is grossly unremarkable. Faint high attenuating content within the gallbladder most compatible with sludge or small stones. Apparent haziness of the gallbladder wall. Further evaluation with ultrasound is recommended to exclude acute cholecystitis. Pancreas: The pancreas is suboptimally visualized but appears unremarkable as visualized. Spleen: Normal in size without focal abnormality. Adrenals/Urinary Tract: The adrenal glands are unremarkable. The kidneys, visualized ureters, and urinary bladder are unremarkable. Stomach/Bowel: Evaluation of the bowel is limited in the absence of oral contrast. There is no bowel dilatation or definite evidence of obstruction. There is mild diffuse mesenteric edema and small fluid in the paracolic gutter. The appendix is normal. Vascular/Lymphatic: Advanced aortoiliac atherosclerotic disease. Evaluation of the vasculature is very limited in the absence of intravenous contrast. No portal venous gas. No adenopathy seen. Reproductive: Prostate brachytherapy seeds. Other: Interval decrease in the subcutaneous fat since the prior CT. Musculoskeletal: Degenerative changes of the spine. Grade 1 L2-L3 retrolisthesis. Multilevel disc desiccation and vacuum phenomena most prominent at L3-L3. No acute osseous pathology. IMPRESSION: 1. Probable gallbladder sludge or small stones. Ultrasound is recommended for better evaluation of the gallbladder. 2. No bowel obstruction.  Normal appendix. 3. Mild diffuse mesenteric edema and trace fluid within the pelvis. 4. Cardiomegaly, new since the prior CT. 5.  Partially visualized moderate right pleural effusion with associated partial compressive atelectasis of the visualized right lower lobe. Pneumonia is not excluded. 6. Overall loss of intra-abdominal and subcutaneous fat since the prior CT. Underlying malignancy is not excluded. Clinical correlation is recommended. Electronically Signed   By: Anner Crete M.D.   On: 11/30/2018 13:47   Dg Chest 2 View  Result Date: 11/13/2018 CLINICAL DATA:  Generalized chest pain and abdominal pain. EXAM: CHEST - 2 VIEW COMPARISON:  Two-view chest x-ray 11/09/2018 FINDINGS: The heart is enlarged. Previously noted pulmonary vascular congestion has resolved. There are no significant effusions. Aeration is improved at both bases. There is some residual medial right lower  lobe opacity. IMPRESSION: 1. Improved aeration of both lungs. 2. Stable cardiomegaly without failure. Electronically Signed   By: San Morelle M.D.   On: 11/18/2018 10:25   Ct Head Wo Contrast  Result Date: 11/16/2018 CLINICAL DATA:  Altered level of consciousness. EXAM: CT HEAD WITHOUT CONTRAST TECHNIQUE: Contiguous axial images were obtained from the base of the skull through the vertex without intravenous contrast. COMPARISON:  None. FINDINGS: Brain: No evidence of acute infarction, hemorrhage, hydrocephalus, extra-axial collection or mass lesion/mass effect. Moderate cerebral atrophy. Moderate chronic small vessel ischemia. Remote lacunar infarcts in the bilateral basal ganglia and right cerebellum. Vascular: Atherosclerosis of skull base vasculature without hyperdense vessel. Skull: No fracture or focal lesion. Sinuses/Orbits: Partial non pneumatization of posterior left ethmoid air cells. No sinus fluid levels. Mastoid air cells are clear. Orbits are unremarkable. Other: None. IMPRESSION: 1. No acute intracranial abnormality. 2. Atrophy, chronic small vessel ischemia, and remote lacunar infarcts in the basal ganglia and right cerebellum.  Electronically Signed   By: Keith Rake M.D.   On: 11/16/2018 21:21   Dg Chest Port 1 View  Result Date: 11/16/2018 CLINICAL DATA:  Shortness of breath and chest pain. EXAM: PORTABLE CHEST 1 VIEW COMPARISON:  Radiograph yesterday. FINDINGS: Patient had difficulty tolerating the exam, rotation despite being held for images. Post median sternotomy with grossly stable cardiomegaly. Possible vascular congestion. No evidence of focal airspace disease. Right pleural effusion with blunting of costophrenic angle no pneumothorax. Nodular density projecting over the right mid lung is likely nipple shadow. IMPRESSION: Technically limited study due to positioning. Cardiomegaly with right pleural effusion. Possible vascular congestion. Electronically Signed   By: Keith Rake M.D.   On: 11/16/2018 01:40   US Abdomen Limited Ruq  Result Date: 11/16/2018 CLINICAL DATA:  Elevated liver enzymes EXAM: ULTRASOUND ABDOMEN LIMITED RIGHT UPPER QUADRANT COMPARISON:  CT abdomen and pelvis November 15, 2018 FINDINGS: Gallbladder: No gallstones are evident. There is sludge in the gallbladder. The gallbladder wall is thickened and somewhat edematous in appearance. No pericholecystic fluid evident. No sonographic Murphy sign noted by sonographer. Common bile duct: Diameter: 4 mm. No intrahepatic or extrahepatic biliary duct dilatation evident. Liver: No focal lesion identified. Within normal limits in parenchymal echogenicity. Portal vein is patent on color Doppler imaging with normal direction of blood flow towards the liver. There is evidence of a right pleural effusion. There is mild ascites. IMPRESSION: 1. Gallbladder wall is thickened and edematous. There is sludge in the gallbladder but no gallstones. The appearance of the gallbladder does raise concern for potential acalculous cholecystitis. Given this circumstance, it may be prudent to consider nuclear medicine hepatobiliary imaging study to assess for cystic duct patency.  Note that the patient does have mild ascites which potentially may cause gallbladder wall thickening. 2.  Right pleural effusion. 3.  Mild ascites. Electronically Signed   By: Lowella Grip III M.D.   On: 11/16/2018 11:13        Scheduled Meds:  aspirin  300 mg Rectal Daily   heparin  5,000 Units Subcutaneous Q8H   sodium chloride flush  3 mL Intravenous Q12H   Continuous Infusions:  piperacillin-tazobactam (ZOSYN)  IV 2.25 g (11/17/18 0547)     LOS: 1 day   The patient is critically ill with multiple organ systems failure and requires high complexity decision making for assessment and support, frequent evaluation and titration of therapies, application of advanced monitoring technologies and extensive interpretation of multiple databases. Critical Care Time devoted to patient care services  described in this note  Time spent: 40 minutes     Alvis Pulcini, Geraldo Docker, MD [facility] Pager [phone]  If 7PM-7AM, please contact night-coverage [email or web address] Password [email or web address] 11/17/2018, 8:08 AM

## 2018-11-17 NOTE — Progress Notes (Signed)
Daily Progress Note   Patient Name: [name]       Date: 11/17/2018 DOB: 01-May-1935  Age: 84 y.o. MRN#: [id number] Attending Physician: Allie Bossier, MD Primary Care Physician: No primary care provider on file. Admit Date: 11/16/2018  Reason for Consultation/Follow-up: Establishing goals of care  Subjective: Spoke with RN - she tells me patient appears very uncomfortable. Still requiring bipap, lethargic. Creatinine worse today. No urine output documented.   Length of Stay: 1  Current Medications: Scheduled Meds:  . aspirin  300 mg Rectal Daily  . heparin  5,000 Units Subcutaneous Q8H  . sodium chloride flush  3 mL Intravenous Q12H    Continuous Infusions: . piperacillin-tazobactam (ZOSYN)  IV 2.25 g (11/17/18 0547)    PRN Meds: acetaminophen **OR** acetaminophen, fentaNYL (SUBLIMAZE) injection, nitroGLYCERIN, ondansetron **OR** ondansetron (ZOFRAN) IV     Vital Signs: BP 97/67   Pulse 79   Temp 97.6 F (36.4 C) (Axillary)   Resp 16   Ht 5' 3.5" (1.613 m)   Wt 56.5 kg   SpO2 100%   BMI 21.71 kg/m  SpO2: SpO2: 100 % O2 Device: O2 Device: Bi-PAP O2 Flow Rate: O2 Flow Rate (L/min): 12 L/min  Intake/output summary:   Intake/Output Summary (Last 24 hours) at 11/17/2018 1102 Last data filed at 11/17/2018 0547 Gross per 24 hour  Intake 103 ml  Output 0 ml  Net 103 ml   LBM: Last BM Date: 11/16/18 Baseline Weight: Weight: 59 kg Most recent weight: Weight: 56.5 kg       Palliative Assessment/Data: PPS 10%    Flowsheet Rows     Most Recent Value  Intake Tab  Referral Department  Hospitalist  Unit at Time of Referral  Intermediate Care Unit  Palliative Care Primary Diagnosis  Nephrology  Date Notified  11/16/18  Palliative Care Type  New Palliative care  Reason for  referral  Clarify Goals of Care  Date of Admission  11/25/2018  Date first seen by Palliative Care  11/16/18  # of days Palliative referral response time  0 Day(s)  # of days IP prior to Palliative referral  1  Clinical Assessment  Palliative Performance Scale Score  10%  Psychosocial & Spiritual Assessment  Palliative Care Outcomes  Patient/Family meeting held?  Yes  Who was at the meeting?  wife  Palliative Care Outcomes  Clarified goals of care, Provided advance care planning, Provided psychosocial or spiritual support, Changed CPR status      Patient Active Problem List   Diagnosis Date Noted  . Acute metabolic encephalopathy   . SOB (shortness of breath)   . Chest pain   . Goals of care, counseling/discussion   . Palliative care by specialist   . Acute kidney injury (AKI) with acute tubular necrosis (ATN) ([place]) 11/26/2018  . 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 Hyperkalemia 11/09/2018  . Rectal bleeding 08/03/2017  . Penile bleeding 08/03/2017  . Acute on chronic combined systolic and diastolic CHF (congestive heart failure) ([place]) 09/04/2016  . Chronic kidney disease, stage 3 ([place]) 08/04/2016  . Carotid artery disease ([place])   . Dementia ([place])   .  Coronary artery disease of native heart with stable angina pectoris ([place])   . Ischemic cardiomyopathy   . NSTEMI (non-ST elevated myocardial infarction) ([place]) 01/27/2015  . Essential hypertension 01/27/2015  . Hyperlipidemia, unspecified 01/27/2015  . History of non-ST elevation myocardial infarction (NSTEMI) 01/27/2015  . Abdominal bruit 09/08/2013  . Mitral valvular insufficiency and aortic valvular insufficiency 03/23/2009  . PROSTATE CANCER 03/22/2009  . Hypertensive heart disease with chronic combined systolic and diastolic congestive heart failure ([place]) 03/22/2009    Palliative Care Assessment & Plan   HPI: 84 y.o. male  with past medical history  of CAD, recent NSTEMI, CHF (EF 30%), CKD, PUD, HTN, HLD, prostate cancer, and dementia admitted on 11/30/2018 with abdominal pain and chest pain. Patient diagnosed with acute on chronic heart failure and is not a candidate for any interventions given his AKI and dementia. He is requiring bipap due to respiratory failure. Also has AKI with creatinine rising, no urine output today per RN. PMT consulted for [place].   Assessment: Follow up call with patient's wife, [name]. She tells me she visited the patient last night and tells me he was in "great spirits" - he was singing. She tells me this gave her a lot of hope.   I shared clinical updates with her - worsening creatinine, still requiring bipap, no urine output. I shared my concern that this may be a situation that is not reversible.   I detailed the differences between an aggressive care path and a comfort path - she fluctuated throughout our conversation on how she wanted to proceed but ultimately stated she would like to continue aggressive care - mentioning she wants him to continue to use bipap if he needs it.   I expressed my concern again that this may not be a situation Mr. Tatar can survive despite aggressive care measures. She expresses her hope that he will recover and remains hopeful that he can come home.   It does not seem that patient's wife grasps reality of situation - she tells me today she did not realize patient's kidneys were not doing well - this was discussed in detail yesterday with her by me and Dr. Posey Pronto. It should be noted that patient's wife is under a lot of stress - she shared with me yesterday about multiple family members who have recently passed away (2 of them very young) - she is having difficulty processing the reality that her husband's prognosis is very poor.    Discussed conversation with RN - wife is coming to visit patient today - RN will call if wife has any further questions or concerns or any changes in patient's  status. PMT will plan to follow up again tomorrow if we do not speak again today.   Recommendations/Plan: -Again poor prognosis discussed in detail, wife remains hopeful for improvement - not yet ready for full comfort care - code status remains DNR per conversation with wife 7/7 - continue current measures - PMT continue to follow closely - may speak with wife again this PM per her request, if not, will call back tomorrow  Goals of Care and Additional Recommendations:  Limitations on Scope of Treatment: No Artificial Feeding  Code Status:  DNR  Prognosis:   Unable to determine - w/o significant improvement, suspect days  Discharge Planning:  To Be Determined - w/o significant improvement suspect hospital death  Care plan was discussed with RN and patient's wife  Thank you for allowing the Palliative Medicine Team to assist in  the care of this patient.   Total Time 35 minutes Prolonged Time Billed  no    The above conversation was completed via telephone due to the visitor restrictions during the COVID-19 pandemic. Thorough chart review and discussion with necessary members of the care team was completed as part of assessment. All issues were discussed and addressed but no physical exam was performed.    Greater than 50%  of this time was spent counseling and coordinating care related to the above assessment and plan.  Juel Burrow, DNP, [facility] Palliative Medicine Team Team Phone # [phone]  Pager [phone]

## 2018-11-17 NOTE — Progress Notes (Signed)
Progress Note  Patient Name: [name] Date of Encounter: 11/17/2018  Primary Cardiologist:   Mertie Moores, MD   Subjective   The patient is asleep and not wake up for this examination.  His wife said he did well today.  She was at the bedside.  He still requiring BiPAP.  Inpatient Medications    Scheduled Meds:  aspirin  300 mg Rectal Daily   heparin  5,000 Units Subcutaneous Q8H   sodium chloride flush  3 mL Intravenous Q12H   Continuous Infusions:  piperacillin-tazobactam (ZOSYN)  IV 2.25 g (11/17/18 0547)   PRN Meds: acetaminophen **OR** acetaminophen, fentaNYL (SUBLIMAZE) injection, nitroGLYCERIN, ondansetron **OR** ondansetron (ZOFRAN) IV   Vital Signs    Vitals:   11/16/18 2300 11/17/18 0351 11/17/18 0417 11/17/18 0551  BP: 98/70  104/72   Pulse: 86 84 83 84  Resp: (!) 25 (!) 0 (!) 31 14  Temp:   97.9 F (36.6 C)   TempSrc:   Oral   SpO2: 95% 97%  97%  Weight:      Height:        Intake/Output Summary (Last 24 hours) at 11/17/2018 0754 Last data filed at 11/17/2018 0547 Gross per 24 hour  Intake 103 ml  Output 0 ml  Net 103 ml   Filed Weights   11/26/2018 0915 11/27/2018 1831  Weight: 59 kg 56.5 kg     ECG     NA - Personally Reviewed  Physical Exam   GEN: No acute distress.   Neck: No  JVD Cardiac: RRR, 3/6 apical systolic murmur, no diastolic murmurs, rubs, or gallops.  Respiratory: Clear  to auscultation bilaterally. GI: Soft, nontender, non-distended  MS: No  edema; No deformity.   Labs    Chemistry Recent Labs  Lab 11/16/18 0334 11/16/18 1902 11/17/18 0314  NA 138 139 139  K 3.9 4.4 4.1  CL 98 101 101  CO2 17* 19* 19*  GLUCOSE 197* 132* 124*  BUN 89* 98* 103*  CREATININE 3.83* 4.36* 4.54*  CALCIUM 8.9 8.6* 8.5*  PROT 6.9 6.6 6.5  ALBUMIN 3.2* 3.1* 3.1*  AST 84* 222* 191*  ALT 98* 206* 212*  ALKPHOS 109 90 94  BILITOT 1.6* 1.3* 1.4*  GFRNONAA 14* 12* 11*  GFRAA 16* 14* 13*  ANIONGAP 23* 19* 19*      Hematology Recent Labs  Lab 11/16/18 0131 11/16/18 1902 11/17/18 0314  WBC 8.6 6.5 7.8  RBC 4.64 4.14* 4.23  HGB 13.2 11.9* 12.3*  HCT 41.1 36.2* 36.4*  MCV 88.6 87.4 86.1  MCH 28.4 28.7 29.1  MCHC 32.1 32.9 33.8  RDW 15.3 15.2 15.1  PLT 140* 159 178    Cardiac EnzymesNo results for input(s): TROPONINI in the last 168 hours. No results for input(s): TROPIPOC in the last 168 hours.   BNPNo results for input(s): BNP, PROBNP in the last 168 hours.   DDimer No results for input(s): DDIMER in the last 168 hours.   Radiology    Ct Abdomen Pelvis Wo Contrast  Result Date: 11/14/2018 CLINICAL DATA:  84 year old male with abdominal pain. EXAM: CT ABDOMEN AND PELVIS WITHOUT CONTRAST TECHNIQUE: Multidetector CT imaging of the abdomen and pelvis was performed following the standard protocol without IV contrast. COMPARISON:  CT of the abdomen pelvis dated 04/05/2010 FINDINGS: Evaluation of this exam is limited in the absence of intravenous contrast. Evaluation is also limited due to paucity of intra-abdominal fat as well as streak artifact caused by metallic overlying support device.  Lower chest: Partially visualized moderate appearing right pleural effusion. There is associated partial compressive atelectasis of the visualized right lower lobe. Pneumonia is not excluded. There is mild cardiomegaly, new since the prior CT. No intra-abdominal free air. Trace free fluid within the pelvis. Hepatobiliary: The liver is grossly unremarkable. Faint high attenuating content within the gallbladder most compatible with sludge or small stones. Apparent haziness of the gallbladder wall. Further evaluation with ultrasound is recommended to exclude acute cholecystitis. Pancreas: The pancreas is suboptimally visualized but appears unremarkable as visualized. Spleen: Normal in size without focal abnormality. Adrenals/Urinary Tract: The adrenal glands are unremarkable. The kidneys, visualized ureters, and urinary  bladder are unremarkable. Stomach/Bowel: Evaluation of the bowel is limited in the absence of oral contrast. There is no bowel dilatation or definite evidence of obstruction. There is mild diffuse mesenteric edema and small fluid in the paracolic gutter. The appendix is normal. Vascular/Lymphatic: Advanced aortoiliac atherosclerotic disease. Evaluation of the vasculature is very limited in the absence of intravenous contrast. No portal venous gas. No adenopathy seen. Reproductive: Prostate brachytherapy seeds. Other: Interval decrease in the subcutaneous fat since the prior CT. Musculoskeletal: Degenerative changes of the spine. Grade 1 L2-L3 retrolisthesis. Multilevel disc desiccation and vacuum phenomena most prominent at L3-L3. No acute osseous pathology. IMPRESSION: 1. Probable gallbladder sludge or small stones. Ultrasound is recommended for better evaluation of the gallbladder. 2. No bowel obstruction.  Normal appendix. 3. Mild diffuse mesenteric edema and trace fluid within the pelvis. 4. Cardiomegaly, new since the prior CT. 5. Partially visualized moderate right pleural effusion with associated partial compressive atelectasis of the visualized right lower lobe. Pneumonia is not excluded. 6. Overall loss of intra-abdominal and subcutaneous fat since the prior CT. Underlying malignancy is not excluded. Clinical correlation is recommended. Electronically Signed   By: Anner Crete M.D.   On: 11/18/2018 13:47   Dg Chest 2 View  Result Date: 12/05/2018 CLINICAL DATA:  Generalized chest pain and abdominal pain. EXAM: CHEST - 2 VIEW COMPARISON:  Two-view chest x-ray 11/09/2018 FINDINGS: The heart is enlarged. Previously noted pulmonary vascular congestion has resolved. There are no significant effusions. Aeration is improved at both bases. There is some residual medial right lower lobe opacity. IMPRESSION: 1. Improved aeration of both lungs. 2. Stable cardiomegaly without failure. Electronically Signed   By:  San Morelle M.D.   On: 12/06/2018 10:25   Ct Head Wo Contrast  Result Date: 11/16/2018 CLINICAL DATA:  Altered level of consciousness. EXAM: CT HEAD WITHOUT CONTRAST TECHNIQUE: Contiguous axial images were obtained from the base of the skull through the vertex without intravenous contrast. COMPARISON:  None. FINDINGS: Brain: No evidence of acute infarction, hemorrhage, hydrocephalus, extra-axial collection or mass lesion/mass effect. Moderate cerebral atrophy. Moderate chronic small vessel ischemia. Remote lacunar infarcts in the bilateral basal ganglia and right cerebellum. Vascular: Atherosclerosis of skull base vasculature without hyperdense vessel. Skull: No fracture or focal lesion. Sinuses/Orbits: Partial non pneumatization of posterior left ethmoid air cells. No sinus fluid levels. Mastoid air cells are clear. Orbits are unremarkable. Other: None. IMPRESSION: 1. No acute intracranial abnormality. 2. Atrophy, chronic small vessel ischemia, and remote lacunar infarcts in the basal ganglia and right cerebellum. Electronically Signed   By: Keith Rake M.D.   On: 11/16/2018 21:21   Dg Chest Port 1 View  Result Date: 11/16/2018 CLINICAL DATA:  Shortness of breath and chest pain. EXAM: PORTABLE CHEST 1 VIEW COMPARISON:  Radiograph yesterday. FINDINGS: Patient had difficulty tolerating the exam, rotation despite being held  for images. Post median sternotomy with grossly stable cardiomegaly. Possible vascular congestion. No evidence of focal airspace disease. Right pleural effusion with blunting of costophrenic angle no pneumothorax. Nodular density projecting over the right mid lung is likely nipple shadow. IMPRESSION: Technically limited study due to positioning. Cardiomegaly with right pleural effusion. Possible vascular congestion. Electronically Signed   By: Keith Rake M.D.   On: 11/16/2018 01:40   US Abdomen Limited Ruq  Result Date: 11/16/2018 CLINICAL DATA:  Elevated liver enzymes  EXAM: ULTRASOUND ABDOMEN LIMITED RIGHT UPPER QUADRANT COMPARISON:  CT abdomen and pelvis November 15, 2018 FINDINGS: Gallbladder: No gallstones are evident. There is sludge in the gallbladder. The gallbladder wall is thickened and somewhat edematous in appearance. No pericholecystic fluid evident. No sonographic Murphy sign noted by sonographer. Common bile duct: Diameter: 4 mm. No intrahepatic or extrahepatic biliary duct dilatation evident. Liver: No focal lesion identified. Within normal limits in parenchymal echogenicity. Portal vein is patent on color Doppler imaging with normal direction of blood flow towards the liver. There is evidence of a right pleural effusion. There is mild ascites. IMPRESSION: 1. Gallbladder wall is thickened and edematous. There is sludge in the gallbladder but no gallstones. The appearance of the gallbladder does raise concern for potential acalculous cholecystitis. Given this circumstance, it may be prudent to consider nuclear medicine hepatobiliary imaging study to assess for cystic duct patency. Note that the patient does have mild ascites which potentially may cause gallbladder wall thickening. 2.  Right pleural effusion. 3.  Mild ascites. Electronically Signed   By: Lowella Grip III M.D.   On: 11/16/2018 11:13    Cardiac Studies   ECHO:   1. The left ventricle had a visually estimated ejection fraction of of 30%. The cavity size was mildly dilated. There is mildly increased left ventricular wall thickness. Indeterminant diastolic function. Severe hypokinesis of the inferior and  inferolateral walls, akinesis of the apex.  2. The mitral valve is degenerative. Mild calcification of the mitral valve leaflet. There is mild mitral annular calcification present. Mitral valve regurgitation is mild to moderate by color flow Doppler. No evidence of mitral valve stenosis.  3. The aortic valve is tricuspid Severe calcifcation of the aortic valve. Aortic valve regurgitation is mild  by color flow Doppler. Visually, there appear to be at least moderate aortic stenosis but mean gradient only 9 mmHg. Possible low gradient  moderate or severe aortic stenosis. Low dose dobutamine stress echo may help sort this out.  4. The IVC was normal in size. No complete TR doppler jet so unable to estimate PA systolic pressure.  Patient Profile     84 y.o. male  with a hx of CAD s/p CABG in 2003 and PCI to native LAD and SVG-OM in 2016, chronic combined systolic and diastolic HF, aortic regurgitation and aortic stenosis, HLD intolerant to statins, carotid artery disease, HTN, stage IV CKD, advanced dementia, Prostate CA and hx med non-compliance who is being seen for assistance managing his chronic systolic CHF at the request of Dr. Evangeline Gula,  Internal Medicine  Assessment & Plan    CHRONIC SYSTOLIC AND DIASTOLIC HF:   This is a very difficult situation.  I reviewed the palliative care notes.  Patient is currently n.p.o.  He is not been weaned off of BiPAP.  His wife wants to take him home.  She firmly believes the miracle will happen.  He is to have progressive renal insufficiency and will need dialysis for volume management.  They currently  do not want dialysis.  NSTEMI: He has severe diffuse disease as previously described.  However, he is not a candidate for catheterization particularly given the fact the family does not want dialysis.  He has been given aspirin and we will switch meds to p.o. when able.  ACUTE ON CHRONIC RENAL FAILURE: Has progressive renal insufficiency and oliguria.  AORTIC STENOSIS: This is probably more severe than his gradient measures given his reduced ejection fraction.  Is not a candidate for valve surgery or TAVR.    For questions or updates, please contact [place] Please consult [email or web address] for contact info under Cardiology/STEMI.   Signed, [name], MD  11/17/2018, 7:54 AM

## 2018-11-18 ENCOUNTER — Encounter (HOSPITAL_COMMUNITY): Payer: Self-pay

## 2018-11-18 ENCOUNTER — Inpatient Hospital Stay (HOSPITAL_COMMUNITY): Payer: Medicare Other

## 2018-11-18 DIAGNOSIS — I252 Old myocardial infarction: Secondary | ICD-10-CM

## 2018-11-18 DIAGNOSIS — R1011 Right upper quadrant pain: Secondary | ICD-10-CM

## 2018-11-18 LAB — BASIC METABOLIC PANEL
Anion gap: 22 — ABNORMAL HIGH (ref 5–15)
BUN: 126 mg/dL — ABNORMAL HIGH (ref 8–23)
CO2: 17 mmol/L — ABNORMAL LOW (ref 22–32)
Calcium: 8.4 mg/dL — ABNORMAL LOW (ref 8.9–10.3)
Chloride: 103 mmol/L (ref 98–111)
Creatinine, Ser: 4.93 mg/dL — ABNORMAL HIGH (ref 0.61–1.24)
GFR calc Af Amer: 12 mL/min — ABNORMAL LOW (ref >60)
GFR calc non Af Amer: 10 mL/min — ABNORMAL LOW (ref >60)
Glucose, Bld: 147 mg/dL — ABNORMAL HIGH (ref 70–99)
Potassium: 4.6 mmol/L (ref 3.5–5.1)
Sodium: 142 mmol/L (ref 135–145)

## 2018-11-18 LAB — URINALYSIS, ROUTINE W REFLEX MICROSCOPIC
Bilirubin Urine: NEGATIVE
Glucose, UA: NEGATIVE mg/dL
Ketones, ur: NEGATIVE mg/dL
Leukocytes,Ua: NEGATIVE
Nitrite: NEGATIVE
Protein, ur: 30 mg/dL — AB
Specific Gravity, Urine: 1.02 (ref 1.005–1.030)
pH: 5 (ref 5.0–8.0)

## 2018-11-18 LAB — BLOOD GAS, ARTERIAL
Acid-Base Excess: 9.7 mmol/L — ABNORMAL HIGH (ref 0.0–2.0)
Bicarbonate: 13.1 mmol/L — ABNORMAL LOW (ref 20.0–28.0)
Drawn by: 550621
O2 Content: 5 L/min
O2 Saturation: 99.3 %
Patient temperature: 97.5
pH, Arterial: 7.501 — ABNORMAL HIGH (ref 7.350–7.450)
pO2, Arterial: 171 mmHg — ABNORMAL HIGH (ref 83.0–108.0)

## 2018-11-18 LAB — CBC
HCT: 37.9 % — ABNORMAL LOW (ref 39.0–52.0)
Hemoglobin: 12.6 g/dL — ABNORMAL LOW (ref 13.0–17.0)
MCH: 29 pg (ref 26.0–34.0)
MCHC: 33.2 g/dL (ref 30.0–36.0)
MCV: 87.1 fL (ref 80.0–100.0)
Platelets: 155 10*3/uL (ref 150–400)
RBC: 4.35 MIL/uL (ref 4.22–5.81)
RDW: 15.5 % (ref 11.5–15.5)
WBC: 9.6 10*3/uL (ref 4.0–10.5)
nRBC: 0.9 % — ABNORMAL HIGH (ref 0.0–0.2)

## 2018-11-18 LAB — MRSA PCR SCREENING: MRSA by PCR: NEGATIVE

## 2018-11-18 LAB — ALBUMIN: Albumin: 3 g/dL — ABNORMAL LOW (ref 3.5–5.0)

## 2018-11-18 LAB — LACTIC ACID, PLASMA: Lactic Acid, Venous: 5.1 mmol/L (ref 0.5–1.9)

## 2018-11-18 LAB — MAGNESIUM: Magnesium: 2.9 mg/dL — ABNORMAL HIGH (ref 1.7–2.4)

## 2018-11-18 MED ORDER — SODIUM BICARBONATE 8.4 % IV SOLN
30.0000 mL/h | INTRAVENOUS | Status: DC
  Filled 2018-11-18: qty 50

## 2018-11-18 MED ORDER — SODIUM BICARBONATE 8.4 % IV SOLN
50.0000 meq | Freq: Once | INTRAVENOUS | Status: DC
  Filled 2018-11-18: qty 50

## 2018-11-18 MED ORDER — DEXTROSE-NACL 5-0.9 % IV SOLN
INTRAVENOUS | Status: DC
  Administered 2018-11-18 (×2): via INTRAVENOUS

## 2018-11-18 NOTE — Consult Note (Addendum)
[facility] Renal Consultation Note  Requesting MD:  Indication for Consultation:  AKI  Chief complaint: shortness of breath and abdominal pain  HPI:  [name] is a 84 y.o. male with a history of chronic systolic and diastolic CHF, CAD s/p CABG 2003, aortic stenosis, and CKD stage III who presented to the hospital with shortness of breath and abdominal pain.  Patient was initiated on BiPAP and this was discontinued yesterday per nurse report.  Still reports some shortness of breath.  He was also found to have AKI with a creatinine of 2.82 compared with baseline of 1.7-1.8.  His creatinine has continued to worsen and is 4.93 on consult.  Nephrology is consulted regarding the same.  He had 475 mL uop over 7/8.  Note he had a recent admission for an NSTEMI and cardiology felt that he was not a candidate for intervention at that time; he had been discharged on lasix to optimize his CHF.  Here his lasix has been held and he was gently hydrated.  He has had recent deterioration in mental status.  The patient is charted as having a history of dementia (note as early as 06/2017 documentation) however his wife states that she is not aware of a formal diagnosis of dementia.  Outpatient charting notes poor historians.  She is at bedside and is understandably overwhelmed with everything going on with his health right now.  She calls to consult with her daughter as to whether or not my recommendation of a Foley catheter is appropriate and her daughter is ok with going ahead.  Note that palliative care was consulted and they indicated in their note that his wife was hoping for a miracle and improvement in his kidney failure.  Per charting she had discussed with cardiology that they would not ever want dialysis.  Previous outpatient charting has indicated difficulty with urine flow.  Abdominal imaging was notable for gallbladder wall thickened and edematous with sludge in the gallbladder without overt  gallstones.  Also with mild ascites and right pleural effusion.  Visualized kidneys and ureters and bladder unremarkable.    PMHx:   Past Medical History:  Diagnosis Date  . CAD (coronary artery disease)    a. CABG (2003)  b. 9/17/6: NSTEMI s/p overlapping DES x2 to LAD and DESx1 to SVG-->OM   . Carotid artery disease ([place])    a. 09/2013: carotid dopplers with stable 1-39% ICA stenosis bilaterally  //  b. Carotid US 0/73: RICA 71-06%; LICA 2-69% >> FU 1 year // c. Carotid US 09/2016: Bilateral ICA 1-39 >> follow-up 1 year  //  d. Carotid US 5/19: Bilateral ICA 1-39; decreased flow in right subclavian artery   . CHF (congestive heart failure) ([place])   . Chronic kidney disease (CKD), stage III (moderate) (HCC)   . Dementia ([place])   . History of echocardiogram    Echo 4/18: mild conc LVH, EF 35-40, diff HK, anteroseptal and inf-septal HK, Gr 2 DD, mild AS (mean 13), mod AI, mildly dilated asc Ao (43 mm), mod MAC, mod MR, severe LAE, mild TR, mod PI, PASP 43  . Hyperlipidemia   . Hypertension   . Internal hemorrhoids   . Ischemic cardiomyopathy    a. 2D ECHO 01/28/15 w/ EF 25-30%, severe HK of the basal-midinferoseptal myocardium, basal-midinferior myocardium and midanteroseptal myocardium. Akinesis of apical inferior and apicalanterior myocardium. G2DD. Mild AS, Asc aorta diameter 95m. Mild dilation of aortic root. Mild MR, mild PR and PA peak pressure 36  mmg HG.  . Peptic ulcer disease   . Prostate CA [facility])    a. s/p seed implants    Past Surgical History:  Procedure Laterality Date  . CARDIAC CATHETERIZATION N/A 01/27/2015   Procedure: Left Heart Cath and Coronary Angiography;  Surgeon: Wellington Hampshire, MD;  Location: [facility];  Service: Cardiovascular;  Laterality: N/A;  . CARDIAC SURGERY  2003   SVG LAD, SVG to right coronary artery, SVG to marginal    Family Hx:  Family History  Problem Relation Age of Onset  . Stroke Father     Social History: per chart review    reports that he has quit smoking. He has never used smokeless tobacco. He reports that he does not drink alcohol or use drugs.  Allergies:  Allergies  Allergen Reactions  . Ramipril Swelling and Other (See Comments)    Angioedema   . Statins Other (See Comments)    Elevated LFTs with statins in the past  . Latex Itching and Rash  . Coreg [Carvedilol] Itching and Other (See Comments)    CANNOT TAKE MYLAN BRAND due to fillers (patient uses [employer]uro[employer] Brand) Hallucinations Altered mental status    Medications: Prior to Admission medications   Medication Sig Start Date End Date Taking? Authorizing Provider  aspirin EC 81 MG tablet Take 1 tablet (81 mg total) by mouth daily. 08/03/17  Yes End, Harrell Gave, MD  carvedilol (COREG) 6.25 MG tablet Take 6.25 mg by mouth 2 (two) times daily with a meal.   Yes [provider]  ezetimibe (ZETIA) 10 MG tablet Take 1 tablet (10 mg total) by mouth daily. 10/26/18  Yes Weaver, Scott T, PA-C  furosemide (LASIX) 40 MG tablet Take 1 tablet (40 mg total) by mouth 2 (two) times daily. 11/11/18  Yes Kathie Dike, MD  isosorbide mononitrate (IMDUR) 30 MG 24 hr tablet Take 1 tablet (30 mg total) by mouth daily. 09/29/18  Yes Weaver, Scott T, PA-C  loratadine (CLARITIN) 10 MG tablet Take 10 mg by mouth daily.   Yes [provider]  nitroGLYCERIN (NITROSTAT) 0.4 MG SL tablet place 1 tablet under the tongue if needed every 5 minutes for chest pain  UP TO 3 DOSES Patient taking differently: Place 0.4 mg under the tongue every 5 (five) minutes as needed for chest pain. UP TO 3 DOSES 04/19/18   [name] T, PA-C    I have reviewed the patient's current medications.  Labs:  BMP Latest Ref Rng & Units 11/18/2018 11/17/2018 11/16/2018  Glucose 70 - 99 mg/dL 147(H) 124(H) 132(H)  BUN 8 - 23 mg/dL 126(H) 103(H) 98(H)  Creatinine 0.61 - 1.24 mg/dL 4.93(H) 4.54(H) 4.36(H)  BUN/Creat Ratio 10 - 24 - - -  Sodium 135 - 145 mmol/L 142 139 139  Potassium 3.5  - 5.1 mmol/L 4.6 4.1 4.4  Chloride 98 - 111 mmol/L 103 101 101  CO2 22 - 32 mmol/L 17(L) 19(L) 19(L)  Calcium 8.9 - 10.3 mg/dL 8.4(L) 8.5(L) 8.6(L)    Urinalysis    Component Value Date/Time   COLORURINE YELLOW 11/18/2018 0858   APPEARANCEUR HAZY (A) 11/18/2018 0858   LABSPEC 1.020 11/18/2018 0858   PHURINE 5.0 11/18/2018 0858   GLUCOSEU NEGATIVE 11/18/2018 0858   HGBUR SMALL (A) 11/18/2018 0858   BILIRUBINUR NEGATIVE 11/18/2018 0858   KETONESUR NEGATIVE 11/18/2018 0858   PROTEINUR 30 (A) 11/18/2018 0858   UROBILINOGEN 0.2 01/27/2015 1007   NITRITE NEGATIVE 11/18/2018 0858   LEUKOCYTESUR NEGATIVE 11/18/2018 1610  ROS:  Unable to obtain secondary to altered mental status   Physical Exam: Vitals:   11/18/18 0842 11/18/18 1313  BP: (!) 98/59 100/82  Pulse: 74 71  Resp: 20   Temp:    SpO2: 100% 97%     General: elderly male in bed uncomfortable appearing HEENT: thin NCAT Neck: trachea midline; supple  Heart: S1S2; no rub  Lungs: clear to auscultation but increased work of breathing with exertion  Abdomen: thin; does have some tenderness to palpation  Extremities: there is no extremity edema  Skin: no lesions on extremities exposed Neuro: patient is oriented to name but not year or location GU: condom catheter is in place; not much urine in foley   Assessment/Plan:  # AKI  - Secondary to ischemic and pre-renal insults.  Cannot r/o any component of obstruction and he has had a condom catheter - With declining renal function would recommend foley catheter placement if patient's family is agreeable to this.  Note some reported prior difficulty with urination per distant outpatient charting  - Scheduled lasix is on hold for now - Agree with goals of care discussions.  Unfortunately he is a poor candidate for dialysis  # CKD stage III  - noted that his baseline Cr 1.7-1.8  # CAD with NSTEMI - Per cardiology; patient with severe diffuse disease and not a candidate  for intervention  # Chronic combined systolic and diastolic CHF  - Scheduled lasix is on hold for now. No overt edema on exam but do note mild ascites and right pleural effusion - Acceptable for lasix as needed to optimize cardiopulmonary status and for patient comfort    # Encephalopathy - Reported history of dementia - charted per 06/2017 notes   # Respiratory alkalosis  - Pulmonology previously consulted and felt that his status worsened on BIPAP  - Spoke with primary team; patient is DNR/DNI  # Abdominal pain - Per primary team   # Aortic stenosis  - Per cardiology patient is not a candidate for valve surgery or TAVR  Appreciate teams and palliative care.   Claudia Desanctis 11/18/2018, 2:58 PM

## 2018-11-18 NOTE — Progress Notes (Signed)
Daily Progress Note   Patient Name: [name]       Date: 11/18/2018 DOB: 06/10/1934  Age: 84 y.o. MRN#: [id number] Attending Physician: Allie Bossier, MD Primary Care Physician: No primary care provider on file. Admit Date: 11/16/2018  Reason for Consultation/Follow-up: Establishing goals of care  Subjective: Discussed with Dr. Sherral Hammers - he provided update on patient condition and we discussed conversations with family yesterday.   Length of Stay: 2  Current Medications: Scheduled Meds:  . aspirin  300 mg Rectal Daily  . heparin  5,000 Units Subcutaneous Q8H  . sodium chloride flush  3 mL Intravenous Q12H    Continuous Infusions: . dextrose 5 % and 0.9% NaCl    . piperacillin-tazobactam (ZOSYN)  IV 2.25 g (11/18/18 0517)    PRN Meds: acetaminophen **OR** acetaminophen, fentaNYL (SUBLIMAZE) injection, nitroGLYCERIN, ondansetron **OR** ondansetron (ZOFRAN) IV     Vital Signs: BP (!) 98/59   Pulse 74   Temp 97.7 F (36.5 C) (Axillary)   Resp 20   Ht 5' 3.5" (1.613 m)   Wt 56.5 kg   SpO2 100%   BMI 21.71 kg/m  SpO2: SpO2: 100 % O2 Device: O2 Device: Room Air O2 Flow Rate: O2 Flow Rate (L/min): 0.5 L/min  Intake/output summary:   Intake/Output Summary (Last 24 hours) at 11/18/2018 1102 Last data filed at 11/18/2018 0400 Gross per 24 hour  Intake 486.33 ml  Output 475 ml  Net 11.33 ml   LBM: Last BM Date: 11/17/18 Baseline Weight: Weight: 59 kg Most recent weight: Weight: 56.5 kg       Palliative Assessment/Data: PPS 10%    Flowsheet Rows     Most Recent Value  Intake Tab  Referral Department  Hospitalist  Unit at Time of Referral  Intermediate Care Unit  Palliative Care Primary Diagnosis  Nephrology  Date Notified  11/16/18  Palliative Care Type  New Palliative care   Reason for referral  Clarify Goals of Care  Date of Admission  11/30/2018  Date first seen by Palliative Care  11/16/18  # of days Palliative referral response time  0 Day(s)  # of days IP prior to Palliative referral  1  Clinical Assessment  Palliative Performance Scale Score  10%  Psychosocial & Spiritual Assessment  Palliative Care Outcomes  Patient/Family meeting held?  Yes  Who was at the meeting?  wife  Palliative Care Outcomes  Clarified goals of care, Provided advance care planning, Provided psychosocial or spiritual support, Changed CPR status      Patient Active Problem List   Diagnosis Date Noted  . Acute metabolic encephalopathy   . SOB (shortness of breath)   . Chest pain   . Goals of care, counseling/discussion   . Palliative care by specialist   . Acute kidney injury (AKI) with acute tubular necrosis (ATN) ([place]) 11/21/2018  . Hypertensive heart and renal disease with both (congestive) heart failure and renal failure ([place]) 11/14/2018  . Hyperkalemia 11/09/2018  . Rectal bleeding 08/03/2017  . Penile bleeding 08/03/2017  . Acute on chronic combined systolic and diastolic CHF (congestive heart failure) ([place]) 09/04/2016  . Chronic kidney disease, stage 3 ([place]) 08/04/2016  . Carotid artery disease ([place])   .  Dementia ([place])   . Coronary artery disease of native heart with stable angina pectoris ([place])   . Ischemic cardiomyopathy   . NSTEMI (non-ST elevated myocardial infarction) ([place]) 01/27/2015  . Essential hypertension 01/27/2015  . Hyperlipidemia, unspecified 01/27/2015  . History of non-ST elevation myocardial infarction (NSTEMI) 01/27/2015  . Abdominal bruit 09/08/2013  . Mitral valvular insufficiency and aortic valvular insufficiency 03/23/2009  . PROSTATE CANCER 03/22/2009  . Hypertensive heart disease with chronic combined systolic and diastolic congestive heart failure ([place]) 03/22/2009    Palliative Care Assessment & Plan   HPI: 84 y.o. male  with past  medical history of CAD, recent NSTEMI, CHF (EF 30%), CKD, PUD, HTN, HLD, prostate cancer, and dementia admitted on 11/24/2018 with abdominal pain and chest pain. Patient diagnosed with acute on chronic heart failure and is not a candidate for any interventions given his AKI and dementia. He is requiring bipap due to respiratory failure. Also has AKI with creatinine rising, no urine output today per RN. PMT consulted for [name].   Assessment: Follow up call again today with patient's wife. She was at patient's bedside. She tells me her concerns that patient is suffering. She tells me about conversations with Dr. Sherral Hammers and conversations with her daughter.   Wife is fixated on patient eating and walking. We discussed the reasons he is unable to eat - still needing bipap intermittently and extreme lethargy; discussed risk of aspiration. She seems to accept this.  She tells me again that she just wants the patient to come home because she thinks he will get better at home - we discussed that his kidneys are not improving and [name] appears to be approaching the end of his life. She does not seem to accept this and tells me she has a good feeling that he will get better.  We discussed that we could work on a plan to get [name] home, but it would involve focusing on his comfort as he approaches end of life. We discussed that hospice could help her care for him at home. She immediately tells me she is not interested in hospice care.   We also discussed transition to comfort care while he is here in this hospital - detailed the differences between comfort care and the care he is receiving now. She would like to continue on an aggressive care plan for now.   At this time, patient's wife still firmly believes patient is going to get better despite multiple discussions about his poor prognosis.   **It should be noted that patient's wife is under a lot of stress - she shared with me yesterday about multiple family  members who have recently passed away (2 of them very young) - she is having difficulty processing the reality that her husband's prognosis is very poor.    Recommendations/Plan: -Lengthy conversation again with wife discussing poor prognosis and goals of care - she remains hopeful for improvement and would like to continue aggressive care measures - code status remains DNR per conversation with wife 7/7 - continue current measures - PMT continue to follow closely - will call again 7/10  Goals of Care and Additional Recommendations:  Limitations on Scope of Treatment: No Artificial Feeding  Code Status:  DNR  Prognosis:   Unable to determine - w/o significant improvement, suspect days  Discharge Planning:  To Be Determined - w/o significant improvement suspect hospital death  Care plan was discussed with RN and patient's wife  Thank you for allowing  the Palliative Medicine Team to assist in the care of this patient.   Total Time 35 minutes Prolonged Time Billed  no    The above conversation was completed via telephone due to the visitor restrictions during the COVID-19 pandemic. Thorough chart review and discussion with necessary members of the care team was completed as part of assessment. All issues were discussed and addressed but no physical exam was performed.    Greater than 50%  of this time was spent counseling and coordinating care related to the above assessment and plan.  Juel Burrow, DNP, [facility] Palliative Medicine Team Team Phone # [phone]  Pager [phone]

## 2018-11-18 NOTE — Progress Notes (Signed)
Critical ABGs called by respiratory PH 7.50 CO2 <15 O2 171 Bicarb 13 Hospitalist paged. Patient remained on BiPap for less than 2 hours overnight and then decided not to wear it anymore. Education provided to both patient and wife at bedside.

## 2018-11-18 NOTE — Progress Notes (Signed)
Patient noted to be mouth breathing with periods of apnea noted. Reminded patient to breathe out of nose and blow out of mouth. 8L HFNC on, but with mouth breathing he is beginning to sat at 74% without recovery. Nonrebreather mask applied. Attempts to take mask off several times. Patient sat up in high fowlers position. Wife at bedside. Patient appears to be uncomfortable. Fentanyl and zofran given for patient's discomfort. Resp notified of patient's difficulty breathing. Asked to place bipap on patient. Patient sat at 94-100% on bipap. Educated wife to allow him to keep on bipap due to his periods of apnea. Wife verbalizes understanding. Mittens placed on patient for patient safety as wife says "he was attempting to pull out the tube in his penis."

## 2018-11-18 NOTE — Progress Notes (Signed)
RT NOTE: ABG attempted by two RT's. Unable to obtain ABG sample. Patient is writhing in bed asking for someone to help him. RT has paged MD to make aware unable to obtain ABG. Vitals are stable. RT will continue to monitor.

## 2018-11-18 NOTE — Progress Notes (Addendum)
MD called this nurse saying "its ok to remove catheter if it is agitating patient." Will continue to observe patient. Resting with eyes closed at this time. Bipap intact.   2344- Urinary cath removed due to continued patient agitation.  0026- Patient continues to pull at and remove bipap mask. 8L HFNC replaced. Sats 100% at this time.

## 2018-11-18 NOTE — Progress Notes (Signed)
PROGRESS NOTE    [name]  MWU:[id number] DOB: 06-03-1934 DOA: 11/18/2018 PCP: No primary care provider on file.   Brief Narrative:  84 y.o. BM PMHx NSTEMI discharge 11/11/2018, CHF, HTN, CKD stage III, peptic ulcer disease, sensitivity [name] requiring a special preparation,   Presents the emergency department initially complaining of left sided upper abdominal and lower chest pain that began this morning.  His pain is sharp and intermittent and radiates to his left upper back.  He had associated nausea but no vomiting.  Symptoms feel a little bit similar to his previous ED visit when he was diagnosed with a myocardial infarction.  He has no associated shortness of breath, cough, diarrhea, constipation, urinary symptoms, or fever.  Patient discharged from our facility on seven 06/2018 after admission for non-STEMI and congestive heart failure exacerbation.  Cardiac catheterization was not done due to risk of renal injury.  Consultation with cardiology was obtained and they recommended discharge home on Lasix twice daily.     Subjective: 7/9 somnolent but arousable.  A/O x1 (does not know where, when, why)    Assessment & Plan:   Principal Problem:   Acute kidney injury (AKI) with acute tubular necrosis (ATN) ([place]) Active Problems:   Hypertensive heart disease with chronic combined systolic and diastolic congestive heart failure (HCC)   Hyperlipidemia, unspecified   History of non-ST elevation myocardial infarction (NSTEMI)   Dementia (HCC)   Acute on chronic combined systolic and diastolic CHF (congestive heart failure) (HCC)   Hypertensive heart and renal disease with both (congestive) heart failure and renal failure (HCC)   Acute metabolic encephalopathy   SOB (shortness of breath)   Chest pain   Goals of care, counseling/discussion   Palliative care by specialist  Acute on chronic systolic and diastolic CHF/severe aortic stenosis - EF 30% - Per cardiology not a  candidate for any intervention secondary to increased failure of kidneys.  Per cardiology note they have explained to family that any intervention on their part would result in patient being on dialysis and family does not want dialysis. - Per cardiology NOT a candidate for valve surgery or TAVR -Strict in and out +354.3 -Daily weight Filed Weights   12/02/2018 0915 11/11/2018 1831  Weight: 59 kg 56.5 kg    CAD with elevated troponin -See CHF  Recent NSTEMI  - Patient just discharged on 7/2 for NSTEMI   Acute on CKD stage III (Cr at discharge on 7/2 was 1.76) - Hold all nephrotoxic medication - Patient's kidney function continues to worsen secondary to hypotension, hypoxia. Recent Labs  Lab 11/22/2018 0958 11/16/18 0334 11/16/18 1902 11/17/18 0314 11/18/18 0252  CREATININE 2.82* 3.83* 4.36* 4.54* 4.93*  - 7/9 D5W-0.9% saline 83ml/hr.  Not improving with hydration, but will continue secondary to patient not having any nutrition since admission.. - Patient's renal status continues to deteriorate secondary to hypoperfusion. - Nephrology consulted Dr. Madelon Lips will await further recommendations  Acute respiratory failure with hypoxia - Patient unable to tolerate being off of BiPAP, as he desats into the high 60s to low 70% immediately. - 7/9 will allow palliative care to discuss goals of care with wife -PCXR: Nondiagnostic for patient's respiratory problems see results below - 7/9 ABG;  respiratory alkalosis. -Titrate O2 to maintain SPO2> 92% to including the use of BiPAP/HFNC  Anion gap respiratory alkalosis - Secondary to patient's cardiac function and worsening renal function, cholecystitis may be playing into this picture as well. -7/9 under further consideration appears  to be more of a anion gap respiratory alkalosis with compensation.  DC bicarb drip - D5W-0.9% saline 10ml/hr - ABG 1400  RIGHT pleural effusion - Patient with minimal pleural effusion by PCXR on 7/7.   Effusion so small not even amenable to drainage. -Resolved on PCXR 7/9  HCAP? - Did not appreciate pneumonia on PCXR 7/7 - Negative fever, negative leukocytosis, positive lactic acid.  Therefore will continue antibiotics for now. - Lactic acid @1400   Acalculous cholecystitis/mesenteric edema mild ascites - RIGHT upper quadrant ultrasound shows evidence of possible acalculous cholecystitis, unfortunately patient not a candidate for HIDA scan. - Continue current antibiotics   Elevated LFT -See cholecystitis   Acute metabolic encephalopathy -Multifactorial acute on chronic systolic and diastolic CHF/severe aortic stenosis, acute respiratory failure with hypoxia, infection - Unfortunately patient has multisystem organ failure.  See above problems  Advanced dementia -Unsure of baseline given that patient on BiPAP and somnolent.  Wife unable to articulate today patient's baseline.  .     Goals of care - Palliative care on board.  Wife with UNREASONABLE expectation does not want to hear that patient has multisystem organ failure and the likelihood of surviving this hospitalization is extremely low. - 7/8 spoke at length with daughter,Perdita Guderian who had spoken with a physician on 7/ 7 extensively concerning her father's condition and she seemed very realistic concerning the fact that her father was not going to survive this hospitalization.  Since mother was extremely agitated requested that she be allowed to try to calm her mother down and speak further with her today on her father's condition.  Per palliative care's note wife would like to take patient home, this may be a good choice if we can actually arrange for a safe transfer to home with understanding that patient is not going to survive long.      DVT prophylaxis: Subcu heparin Code Status: DNR Family Communication: Wife and daughter Disposition Plan: TBD   Consultants:  Cardiology PCCM   Procedures/Significant Events:   7/9 PCXR cardiomegaly, negative venous congestion, negative pleural effusion   I have personally reviewed and interpreted all radiology studies and my findings are as above.  VENTILATOR SETTINGS: BiPAP   Cultures   Antimicrobials: Anti-infectives (From admission, onward)   Start     Ordered Stop   11/16/18 1400  piperacillin-tazobactam (ZOSYN) IVPB 2.25 g     11/16/18 1042     11/16/18 0200  piperacillin-tazobactam (ZOSYN) IVPB 3.375 g  Status:  Discontinued     11/16/18 0156 11/16/18 1042       Devices    LINES / TUBES:      Continuous Infusions:  dextrose 5 % and 0.9% NaCl 50 mL/hr at 11/17/18 2115   piperacillin-tazobactam (ZOSYN)  IV 2.25 g (11/18/18 0517)     Objective: Vitals:   11/18/18 0000 11/18/18 0400 11/18/18 0523 11/18/18 0754  BP: 98/79 111/73 102/69 (!) 98/59  Pulse: 76 79 78 84  Resp:      Temp:    97.7 F (36.5 C)  TempSrc:    Axillary  SpO2: 100% 100% 99% 100%  Weight:      Height:        Intake/Output Summary (Last 24 hours) at 11/18/2018 0819 Last data filed at 11/18/2018 0400 Gross per 24 hour  Intake 486.33 ml  Output 475 ml  Net 11.33 ml   Filed Weights   12/03/2018 0915 12/04/2018 1831  Weight: 59 kg 56.5 kg   Physical Exam:  General:,  Somnolent, arousable A/O x1 (does not know where, when, why), positive  acute respiratory distress (patient now off BiPAP), cachectic Eyes: negative scleral hemorrhage, negative anisocoria, negative icterus ENT: Negative Runny nose, negative gingival bleeding, Neck:  Negative scars, masses, torticollis, lymphadenopathy, JVD Lungs: Clear to auscultation bilaterally without wheezes or crackles Cardiovascular: Regular rate and rhythm without murmur gallop or rub normal S1 and S2 Abdomen: Positive RUQ abdominal pain, nondistended, positive soft, bowel sounds, no rebound, no ascites, no appreciable mass Extremities: No significant cyanosis, clubbing, or edema bilateral lower extremities Skin:  Negative rashes, lesions, ulcers Psychiatric:  Negative depression, negative anxiety, negative fatigue, negative mania  Central nervous system:  Cranial nerves II through XII intact, tongue/uvula midline, all extremities muscle strength 5/5, sensation intact throughout, negative dysarthria, negative expressive aphasia, negative receptive aphasia.      Data Reviewed: Care during the described time interval was provided by me .  I have reviewed this patient's available data, including medical history, events of note, physical examination, and all test results as part of my evaluation.   CBC: Recent Labs  Lab 11/24/2018 0958 11/16/18 0131 11/16/18 1902 11/17/18 0314 11/18/18 0252  WBC 7.2 8.6 6.5 7.8 9.6  NEUTROABS 3.7  --  4.4 5.0  --   HGB 12.5* 13.2 11.9* 12.3* 12.6*  HCT 38.6* 41.1 36.2* 36.4* 37.9*  MCV 87.9 88.6 87.4 86.1 87.1  PLT 182 140* 159 178 161   Basic Metabolic Panel: Recent Labs  Lab 11/20/2018 0958 11/16/18 0334 11/16/18 1902 11/17/18 0314 11/18/18 0252  NA 139 138 139 139 142  K 3.7 3.9 4.4 4.1 4.6  CL 99 98 101 101 103  CO2 20* 17* 19* 19* 17*  GLUCOSE 136* 197* 132* 124* 147*  BUN 65* 89* 98* 103* 126*  CREATININE 2.82* 3.83* 4.36* 4.54* 4.93*  CALCIUM 9.2 8.9 8.6* 8.5* 8.4*  MG  --   --  2.6* 2.7* 2.9*   GFR: Estimated Creatinine Clearance: 9.2 mL/min (A) (by C-G formula based on SCr of 4.93 mg/dL (H)). Liver Function Tests: Recent Labs  Lab 11/14/2018 0958 11/16/18 0334 11/16/18 1902 11/17/18 0314 11/18/18 0252  AST 64* 84* 222* 191*  --   ALT 80* 98* 206* 212*  --   ALKPHOS 105 109 90 94  --   BILITOT 1.9* 1.6* 1.3* 1.4*  --   PROT 7.1 6.9 6.6 6.5  --   ALBUMIN 3.4* 3.2* 3.1* 3.1* 3.0*   Recent Labs  Lab 12/07/2018 0958  LIPASE 55*   No results for input(s): AMMONIA in the last 168 hours. Coagulation Profile: No results for input(s): INR, PROTIME in the last 168 hours. Cardiac Enzymes: No results for input(s): CKTOTAL, CKMB, CKMBINDEX,  TROPONINI in the last 168 hours. BNP (last 3 results) Recent Labs    10/05/18 1555  PROBNP 12,132*   HbA1C: No results for input(s): HGBA1C in the last 72 hours. CBG: Recent Labs  Lab 12/10/2018 2321  GLUCAP 138*   Lipid Profile: No results for input(s): CHOL, HDL, LDLCALC, TRIG, CHOLHDL, LDLDIRECT in the last 72 hours. Thyroid Function Tests: No results for input(s): TSH, T4TOTAL, FREET4, T3FREE, THYROIDAB in the last 72 hours. Anemia Panel: No results for input(s): VITAMINB12, FOLATE, FERRITIN, TIBC, IRON, RETICCTPCT in the last 72 hours. Urine analysis:    Component Value Date/Time   COLORURINE YELLOW 01/27/2015 1007   APPEARANCEUR CLOUDY (A) 01/27/2015 1007   LABSPEC 1.023 01/27/2015 1007   PHURINE 5.5 01/27/2015 1007   GLUCOSEU NEGATIVE 01/27/2015 1007  HGBUR MODERATE (A) 01/27/2015 1007   BILIRUBINUR NEGATIVE 01/27/2015 1007   KETONESUR 40 (A) 01/27/2015 1007   PROTEINUR 30 (A) 01/27/2015 1007   UROBILINOGEN 0.2 01/27/2015 1007   NITRITE NEGATIVE 01/27/2015 1007   LEUKOCYTESUR NEGATIVE 01/27/2015 1007   Sepsis Labs: @LABRCNTIP (procalcitonin:4,lacticidven:4)  ) Recent Results (from the past 240 hour(s))  SARS Coronavirus 2 (CEPHEID - Performed in [place] hospital lab), Hosp Order     Status: None   Collection Time: 11/09/18  6:32 PM   Specimen: Nasopharyngeal Swab  Result Value Ref Range Status   SARS Coronavirus 2 NEGATIVE NEGATIVE Final    Comment: (NOTE) If result is NEGATIVE SARS-CoV-2 target nucleic acids are NOT DETECTED. The SARS-CoV-2 RNA is generally detectable in upper and lower  respiratory specimens during the acute phase of infection. The lowest  concentration of SARS-CoV-2 viral copies this assay can detect is 250  copies / mL. A negative result does not preclude SARS-CoV-2 infection  and should not be used as the sole basis for treatment or other  patient management decisions.  A negative result may occur with  improper specimen  collection / handling, submission of specimen other  than nasopharyngeal swab, presence of viral mutation(s) within the  areas targeted by this assay, and inadequate number of viral copies  (<250 copies / mL). A negative result must be combined with clinical  observations, patient history, and epidemiological information. If result is POSITIVE SARS-CoV-2 target nucleic acids are DETECTED. The SARS-CoV-2 RNA is generally detectable in upper and lower  respiratory specimens dur ing the acute phase of infection.  Positive  results are indicative of active infection with SARS-CoV-2.  Clinical  correlation with patient history and other diagnostic information is  necessary to determine patient infection status.  Positive results do  not rule out bacterial infection or co-infection with other viruses. If result is PRESUMPTIVE POSTIVE SARS-CoV-2 nucleic acids MAY BE PRESENT.   A presumptive positive result was obtained on the submitted specimen  and confirmed on repeat testing.  While 2019 novel coronavirus  (SARS-CoV-2) nucleic acids may be present in the submitted sample  additional confirmatory testing may be necessary for epidemiological  and / or clinical management purposes  to differentiate between  SARS-CoV-2 and other Sarbecovirus currently known to infect humans.  If clinically indicated additional testing with an alternate test  methodology [phone]) is advised. The SARS-CoV-2 RNA is generally  detectable in upper and lower respiratory sp ecimens during the acute  phase of infection. The expected result is Negative. Fact Sheet for Patients:  StrictlyIdeas.no Fact Sheet for Healthcare Providers: BankingDealers.co.za This test is not yet approved or cleared by the [place] FDA and has been authorized for detection and/or diagnosis of SARS-CoV-2 by FDA under an Emergency Use Authorization (EUA).  This EUA will remain in effect  (meaning this test can be used) for the duration of the COVID-19 declaration under Section 564(b)(1) of the Act, 21 U.S.C. section 360bbb-3(b)(1), unless the authorization is terminated or revoked sooner. Performed at [facility], [place] [address] [place], [place] [address]   SARS Coronavirus 2 (Performed in [facility] hospital lab)     Status: None   Collection Time: 11/14/2018  4:35 PM   Specimen: Nasal Swab  Result Value Ref Range Status   SARS Coronavirus 2 NEGATIVE NEGATIVE Final    Comment: (NOTE) SARS-CoV-2 target nucleic acids are NOT DETECTED. The SARS-CoV-2 RNA is generally detectable in upper and lower respiratory specimens during the acute  phase of infection. Negative results do not preclude SARS-CoV-2 infection, do not rule out co-infections with other pathogens, and should not be used as the sole basis for treatment or other patient management decisions. Negative results must be combined with clinical observations, patient history, and epidemiological information. The expected result is Negative. Fact Sheet for Patients: SugarRoll.be Fact Sheet for Healthcare Providers: https://www.-mathews.com/ This test is not yet approved or cleared by the [place] FDA and  has been authorized for detection and/or diagnosis of SARS-CoV-2 by FDA under an Emergency Use Authorization (EUA). This EUA will remain  in effect (meaning this test can be used) for the duration of the COVID-19 declaration under Section 56 4(b)(1) of the Act, 21 U.S.C. section 360bbb-3(b)(1), unless the authorization is terminated or revoked sooner. Performed at [facility], [place] [address] [place], [place] [address]   Culture, blood (routine x 2)     Status: None (Preliminary result)   Collection Time: 11/16/18  1:48 AM   Specimen: BLOOD  Result Value Ref Range Status   Specimen Description BLOOD RIGHT ANTECUBITAL  Final   Special Requests    Final    BOTTLES DRAWN AEROBIC ONLY Blood Culture adequate volume   Culture   Final    NO GROWTH 1 DAY Performed at [facility], [place] [address] [place], [place] [address]    Report Status PENDING  Incomplete  Culture, blood (routine x 2)     Status: None (Preliminary result)   Collection Time: 11/16/18  1:55 AM   Specimen: BLOOD  Result Value Ref Range Status   Specimen Description BLOOD RIGHT ANTECUBITAL  Final   Special Requests   Final    BOTTLES DRAWN AEROBIC ONLY Blood Culture results may not be optimal due to an inadequate volume of blood received in culture bottles   Culture   Final    NO GROWTH 1 DAY Performed at [facility], [place] [address] [place], [place] [address]    Report Status PENDING  Incomplete         Radiology Studies: Ct Head Wo Contrast  Result Date: 11/16/2018 CLINICAL DATA:  Altered level of consciousness. EXAM: CT HEAD WITHOUT CONTRAST TECHNIQUE: Contiguous axial images were obtained from the base of the skull through the vertex without intravenous contrast. COMPARISON:  None. FINDINGS: Brain: No evidence of acute infarction, hemorrhage, hydrocephalus, extra-axial collection or mass lesion/mass effect. Moderate cerebral atrophy. Moderate chronic small vessel ischemia. Remote lacunar infarcts in the bilateral basal ganglia and right cerebellum. Vascular: Atherosclerosis of skull base vasculature without hyperdense vessel. Skull: No fracture or focal lesion. Sinuses/Orbits: Partial non pneumatization of posterior left ethmoid air cells. No sinus fluid levels. Mastoid air cells are clear. Orbits are unremarkable. Other: None. IMPRESSION: 1. No acute intracranial abnormality. 2. Atrophy, chronic small vessel ischemia, and remote lacunar infarcts in the basal ganglia and right cerebellum. Electronically Signed   By: Keith Rake M.D.   On: 11/16/2018 21:21   Dg Chest Port 1 View  Result Date: 11/18/2018 CLINICAL DATA:  Chest pain.  Pleural  effusion. EXAM: PORTABLE CHEST 1 VIEW COMPARISON:  11/16/2018. FINDINGS: Prior CABG. Stable cardiomegaly with normal pulmonary vascularity. Lungs are clear. No pleural effusion or pneumothorax. No acute bony abnormality. IMPRESSION: 1. Prior CABG. Stable cardiomegaly. No pulmonary venous congestion. 2.  No pleural effusion noted on today's exam. Electronically Signed   By: [place]   On: 11/18/2018 07:40   US Abdomen Limited Ruq  Result Date:  11/16/2018 CLINICAL DATA:  Elevated liver enzymes EXAM: ULTRASOUND ABDOMEN LIMITED RIGHT UPPER QUADRANT COMPARISON:  CT abdomen and pelvis November 15, 2018 FINDINGS: Gallbladder: No gallstones are evident. There is sludge in the gallbladder. The gallbladder wall is thickened and somewhat edematous in appearance. No pericholecystic fluid evident. No sonographic Murphy sign noted by sonographer. Common bile duct: Diameter: 4 mm. No intrahepatic or extrahepatic biliary duct dilatation evident. Liver: No focal lesion identified. Within normal limits in parenchymal echogenicity. Portal vein is patent on color Doppler imaging with normal direction of blood flow towards the liver. There is evidence of a right pleural effusion. There is mild ascites. IMPRESSION: 1. Gallbladder wall is thickened and edematous. There is sludge in the gallbladder but no gallstones. The appearance of the gallbladder does raise concern for potential acalculous cholecystitis. Given this circumstance, it may be prudent to consider nuclear medicine hepatobiliary imaging study to assess for cystic duct patency. Note that the patient does have mild ascites which potentially may cause gallbladder wall thickening. 2.  Right pleural effusion. 3.  Mild ascites. Electronically Signed   By: Lowella Grip III M.D.   On: 11/16/2018 11:13        Scheduled Meds:  aspirin  300 mg Rectal Daily   heparin  5,000 Units Subcutaneous Q8H   sodium chloride flush  3 mL Intravenous Q12H   Continuous  Infusions:  dextrose 5 % and 0.9% NaCl 50 mL/hr at 11/17/18 2115   piperacillin-tazobactam (ZOSYN)  IV 2.25 g (11/18/18 0517)     LOS: 2 days   The patient is critically ill with multiple organ systems failure and requires high complexity decision making for assessment and support, frequent evaluation and titration of therapies, application of advanced monitoring technologies and extensive interpretation of multiple databases. Critical Care Time devoted to patient care services described in this note  Time spent: 40 minutes     Mariza Bourget, Geraldo Docker, MD [facility] Pager [phone]  If 7PM-7AM, please contact night-coverage [email or web address] Password [email or web address] 11/18/2018, 8:19 AM

## 2018-11-18 NOTE — Progress Notes (Signed)
Patient's wife called for the help, because he was not right. Patient was lethargy, checked v/s  100/82- 71- SpO2 97% with RA. She was on the phone with her daughter and told her nurse gave him something 20 min ago then he changed his condition. Talked to pt's daughter on the phone and explained that it was D5 N/S fluid, but PIV access didn't work, consulted the IV team again. Wife was so upset for his condition down. Patient open his eyes and answered few question now and pt's wife is calm now. Continue to monitor pt's condition. HS [employer]

## 2018-11-19 ENCOUNTER — Ambulatory Visit: Payer: Medicare Other | Admitting: Cardiovascular Disease

## 2018-11-19 DIAGNOSIS — I5043 Acute on chronic combined systolic (congestive) and diastolic (congestive) heart failure: Secondary | ICD-10-CM

## 2018-11-19 DIAGNOSIS — J9601 Acute respiratory failure with hypoxia: Secondary | ICD-10-CM

## 2018-11-19 DIAGNOSIS — F0150 Vascular dementia without behavioral disturbance: Secondary | ICD-10-CM

## 2018-11-19 DIAGNOSIS — R627 Adult failure to thrive: Secondary | ICD-10-CM

## 2018-11-19 LAB — BASIC METABOLIC PANEL
Anion gap: 27 — ABNORMAL HIGH (ref 5–15)
BUN: 143 mg/dL — ABNORMAL HIGH (ref 8–23)
CO2: 13 mmol/L — ABNORMAL LOW (ref 22–32)
Calcium: 8.4 mg/dL — ABNORMAL LOW (ref 8.9–10.3)
Chloride: 103 mmol/L (ref 98–111)
Creatinine, Ser: 6.03 mg/dL — ABNORMAL HIGH (ref 0.61–1.24)
GFR calc Af Amer: 9 mL/min — ABNORMAL LOW (ref >60)
GFR calc non Af Amer: 8 mL/min — ABNORMAL LOW (ref >60)
Glucose, Bld: 132 mg/dL — ABNORMAL HIGH (ref 70–99)
Potassium: 5.3 mmol/L — ABNORMAL HIGH (ref 3.5–5.1)
Sodium: 143 mmol/L (ref 135–145)

## 2018-11-19 LAB — CBC
HCT: 40 % (ref 39.0–52.0)
Hemoglobin: 12.8 g/dL — ABNORMAL LOW (ref 13.0–17.0)
MCH: 28.5 pg (ref 26.0–34.0)
MCHC: 32 g/dL (ref 30.0–36.0)
MCV: 89.1 fL (ref 80.0–100.0)
Platelets: 143 10*3/uL — ABNORMAL LOW (ref 150–400)
RBC: 4.49 MIL/uL (ref 4.22–5.81)
RDW: 16.4 % — ABNORMAL HIGH (ref 11.5–15.5)
WBC: 8.4 10*3/uL (ref 4.0–10.5)
nRBC: 1.8 % — ABNORMAL HIGH (ref 0.0–0.2)

## 2018-11-19 LAB — MAGNESIUM: Magnesium: 3.3 mg/dL — ABNORMAL HIGH (ref 1.7–2.4)

## 2018-11-19 MED ORDER — HALOPERIDOL LACTATE 5 MG/ML IJ SOLN
2.0000 mg | Freq: Four times a day (QID) | INTRAMUSCULAR | Status: DC | PRN
  Administered 2018-11-19: 2 mg via INTRAVENOUS
  Filled 2018-11-19: qty 1

## 2018-11-19 MED ORDER — QUETIAPINE FUMARATE 50 MG PO TABS
25.0000 mg | ORAL_TABLET | Freq: Two times a day (BID) | ORAL | Status: DC
  Administered 2018-11-19: 25 mg via ORAL
  Filled 2018-11-19: qty 1

## 2018-11-21 LAB — CULTURE, BLOOD (ROUTINE X 2)
Culture: NO GROWTH
Culture: NO GROWTH
Special Requests: ADEQUATE

## 2018-12-02 ENCOUNTER — Ambulatory Visit: Payer: Self-pay | Admitting: Neurology

## 2018-12-06 ENCOUNTER — Other Ambulatory Visit: Payer: Self-pay | Admitting: Cardiovascular Disease

## 2018-12-06 DIAGNOSIS — R55 Syncope and collapse: Secondary | ICD-10-CM

## 2018-12-11 NOTE — Progress Notes (Signed)
Patients wife left bedside with daughter at 2230. Wife and daughter stated that they did not have a funeral home lined up at this time and will need a few days to work out the details for funeral home pick up.

## 2018-12-11 NOTE — Evaluation (Signed)
Clinical/Bedside Swallow Evaluation Patient Details  Name: [name] MRN: [id number] Date of Birth: 1934/08/28  Today's Date: Dec 03, 2018 Time: SLP Start Time (ACUTE ONLY): 1108 SLP Stop Time (ACUTE ONLY): 1125 SLP Time Calculation (min) (ACUTE ONLY): 17 min  Past Medical History:  Past Medical History:  Diagnosis Date  . CAD (coronary artery disease)    a. CABG (2003)  b. 9/17/6: NSTEMI s/p overlapping DES x2 to LAD and DESx1 to SVG-->OM   . Carotid artery disease ([place])    a. 09/2013: carotid dopplers with stable 1-39% ICA stenosis bilaterally  //  b. Carotid US 4/48: RICA 18-56%; LICA 3-14% >> FU 1 year // c. Carotid US 09/2016: Bilateral ICA 1-39 >> follow-up 1 year  //  d. Carotid US 5/19: Bilateral ICA 1-39; decreased flow in right subclavian artery   . CHF (congestive heart failure) ([place])   . Chronic kidney disease (CKD), stage III (moderate) (HCC)   . Dementia ([place])   . History of echocardiogram    Echo 4/18: mild conc LVH, EF 35-40, diff HK, anteroseptal and inf-septal HK, Gr 2 DD, mild AS (mean 13), mod AI, mildly dilated asc Ao (43 mm), mod MAC, mod MR, severe LAE, mild TR, mod PI, PASP 43  . Hyperlipidemia   . Hypertension   . Internal hemorrhoids   . Ischemic cardiomyopathy    a. 2D ECHO 01/28/15 w/ EF 25-30%, severe HK of the basal-midinferoseptal myocardium, basal-midinferior myocardium and midanteroseptal myocardium. Akinesis of apical inferior and apicalanterior myocardium. G2DD. Mild AS, Asc aorta diameter 81m. Mild dilation of aortic root. Mild MR, mild PR and PA peak pressure 36 mmg HG.  . Peptic ulcer disease   . Prostate CA ([facility]    a. s/p seed implants   Past Surgical History:  Past Surgical History:  Procedure Laterality Date  . CARDIAC CATHETERIZATION N/A 01/27/2015   Procedure: Left Heart Cath and Coronary Angiography;  Surgeon: MWellington Hampshire MD;  [facility];  Service: Cardiovascular;  Laterality: N/A;  . CARDIAC SURGERY  2003   SVG LAD,  SVG to right coronary artery, SVG to marginal   HPI:  EMabel Rivers a 84y.o. male with medical history significant of non-ST segment myocardial infarction discharge 11/11/2018, congestive heart failure, chronic kidney disease stage III, peptic ulcer disease, hypertension, who presents the emergency department initially complaining of left sided upper abdominal and lower chest pain that began this morning.  His pain is sharp and intermittent and radiates to his left upper back.  He had associated nausea but no vomiting.  Symptoms feel a little bit similar to his previous ED visit when he was diagnosed with a myocardial infarction.  He has no associated shortness of breath, cough, diarrhea, constipation, urinary symptoms, or fever.  Patient discharged from our facility on 11/11/2018 after admission for non-STEMI and congestive heart failure exacerbation.  Cardiac catheterization was not done due to risk of renal injury.  Consultation with cardiology was obtained and they recommended discharge home on Lasix twice daily.  CT head was showing no acute intracranial abnormality.  Chest xray was showing prior CABG, stable cardiomegaly, no pulmonary venous congestion and no pleural effusion.     Assessment / Plan / Recommendation Clinical Impression  Clinical swallowing evaluation was completed using thin liquids via spoon, cup and straw and pureed material.  The patient was confused and unable to fully follow all commands.  Nursing reported no obvious issues given his clear liquid diet.  Obvious oral motor  deficits were not seen, however, full exam was unable to be completed.  He presented with a cognitive based dysphagia charactirized by oral holding seen particularly with pureed material.  Patient required multiple verbal cues to trigger pharyngeal swallow.  Overt s/s of aspiration were not seen, however, given his mentation there is risk for aspiration.  Recommend advance diet to purees with thin liquids.  He  will require full assistance for all intake.  ST will follow during acute stay for therapeutic diet tolerance.   SLP Visit Diagnosis: Dysphagia, unspecified (R13.10)    Aspiration Risk  Mild aspiration risk;Moderate aspiration risk    Diet Recommendation   Puree with thin liquids  Medication Administration: Crushed with puree    Other  Recommendations Oral Care Recommendations: Oral care BID   Follow up Recommendations Other (comment)(TBD)      Frequency and Duration min 2x/week  2 weeks       Prognosis Prognosis for Safe Diet Advancement: Fair Barriers to Reach Goals: Cognitive deficits      Swallow Study   General Date of Onset: 11/27/2018 HPI: [name] is a 84 y.o. male with medical history significant of non-ST segment myocardial infarction discharge 11/11/2018, congestive heart failure, chronic kidney disease stage III, peptic ulcer disease, hypertension, who presents the emergency department initially complaining of left sided upper abdominal and lower chest pain that began this morning.  His pain is sharp and intermittent and radiates to his left upper back.  He had associated nausea but no vomiting.  Symptoms feel a little bit similar to his previous ED visit when he was diagnosed with a myocardial infarction.  He has no associated shortness of breath, cough, diarrhea, constipation, urinary symptoms, or fever.  Patient discharged from our facility on 11/11/2018 after admission for non-STEMI and congestive heart failure exacerbation.  Cardiac catheterization was not done due to risk of renal injury.  Consultation with cardiology was obtained and they recommended discharge home on Lasix twice daily.  CT head was showing no acute intracranial abnormality.  Chest xray was showing prior CABG, stable cardiomegaly, no pulmonary venous congestion and no pleural effusion.   Type of Study: Bedside Swallow Evaluation Previous Swallow Assessment: None noted at [facility]. Diet Prior to this Study:  Other (Comment)(clears) Temperature Spikes Noted: No Respiratory Status: Room air History of Recent Intubation: No Behavior/Cognition: Alert;Doesn't follow directions Oral Cavity Assessment: Within Functional Limits Oral Care Completed by SLP: No Patient Positioning: Upright in bed Baseline Vocal Quality: Low vocal intensity Volitional Swallow: Able to elicit    Oral/Motor/Sensory Function Overall Oral Motor/Sensory Function: Other (comment)(unable to assess)   Ice Chips Ice chips: Not tested   Thin Liquid Thin Liquid: Impaired Presentation: Spoon;Straw;Cup Oral Phase Impairments: Impaired mastication Oral Phase Functional Implications: Prolonged oral transit    Nectar Thick Nectar Thick Liquid: Not tested   Honey Thick Honey Thick Liquid: Not tested   Puree Puree: Impaired Presentation: Spoon Oral Phase Impairments: Poor awareness of bolus;Impaired mastication Oral Phase Functional Implications: Oral holding;Prolonged oral transit   Solid     Solid: Not tested     Shelly Flatten, MA, CCC-SLP Acute Rehab SLP [phone]  Lamar Sprinkles Dec 10, 2018,11:38 AM

## 2018-12-11 NOTE — Progress Notes (Signed)
   No new suggestions.  I did talk during this admission with his wife and she reiterated that they did not want dialysis.  Unfortunately, his volume cannot be controlled without this and he is not a candidate for invasive cardiovascular procedures.  Comfort care is reasonable unless other decisions around dialysis are possible and allowed by the family.  It looks like renal suggests he would be a poor dialysis candidate.  Cardiology has no new suggestions at this point.  Please calll [place] back if there are further questions.

## 2018-12-11 NOTE — Progress Notes (Signed)
[facility] Progress Note    Assessment/ Plan:    # AKI on CKD Stage III:  Secondary to ischemic and pre-renal insults.  Foley in place.  Anuric.  Not a candidate for dialysis.  Recommend comfort care.     # CAD with NSTEMI:  Per cardiology; patient with severe diffuse disease and not a candidate for intervention.  Cardiology has signed off.    # Chronic combined systolic and diastolic CHF:  Acceptable for lasix as needed to optimize cardiopulmonary status and for patient comfort    # Encephalopathy: Reported history of dementia - charted per 06/2017 notes   # Respiratory alkalosis: per primary, DNR/DNI: Spoke with primary team; patient is DNR/DNI  # Abdominal pain: Per primary team   # Aortic stenosis: Per cardiology patient is not a candidate for valve surgery or TAVR  # Dispo: we will sign off.  Please let us know if we can be of service.  Subjective:    Seen in room.  Essentially anuric.  BUN/Cr worsening.  Appears comfortable this AM.         Objective:   BP (!) 88/42   Pulse 68   Temp (!) 97.3 F (36.3 C) (Oral)   Resp (!) 38   Ht 5' 3.5" (1.613 m)   Wt 59.8 kg   SpO2 100%   BMI 22.99 kg/m   Intake/Output Summary (Last 24 hours) at 2018/11/27 8295 Last data filed at 2018-11-27 6213 Gross per 24 hour  Intake 827.22 ml  Output 175 ml  Net 652.22 ml   Weight change:   Physical Exam: General: elderly male in bed, appears more comfortable this AM HEENT: + temporal wasting Neck: + JVD to angle of mandible Heart: S1S2; no rub, blowing murmur heard RUSB Lungs: clear to auscultation but increased work of breathing with exertion  Abdomen: thin; does have some tenderness to palpation  Extremities: there is no extremity edema  Skin: no lesions on extremities exposed GU: condom catheter is in place; not much urine in foley   Imaging: Dg Chest Port 1 View  Result Date: 11/18/2018 CLINICAL DATA:  Chest pain.  Pleural effusion. EXAM: PORTABLE  CHEST 1 VIEW COMPARISON:  11/16/2018. FINDINGS: Prior CABG. Stable cardiomegaly with normal pulmonary vascularity. Lungs are clear. No pleural effusion or pneumothorax. No acute bony abnormality. IMPRESSION: 1. Prior CABG. Stable cardiomegaly. No pulmonary venous congestion. 2.  No pleural effusion noted on today's exam. Electronically Signed   By: Marcello Moores  Register   On: 11/18/2018 07:40    Labs: BMET Recent Labs  Lab 11/21/2018 0865 11/16/18 0334 11/16/18 1902 11/17/18 0314 11/18/18 0252 2018-11-27 0234  NA 139 138 139 139 142 143  K 3.7 3.9 4.4 4.1 4.6 5.3*  CL 99 98 101 101 103 103  CO2 20* 17* 19* 19* 17* 13*  GLUCOSE 136* 197* 132* 124* 147* 132*  BUN 65* 89* 98* 103* 126* 143*  CREATININE 2.82* 3.83* 4.36* 4.54* 4.93* 6.03*  CALCIUM 9.2 8.9 8.6* 8.5* 8.4* 8.4*   CBC Recent Labs  Lab 11/10/2018 0958  11/16/18 1902 11/17/18 0314 11/18/18 0252 11-27-2018 0234  WBC 7.2   < > 6.5 7.8 9.6 8.4  NEUTROABS 3.7  --  4.4 5.0  --   --   HGB 12.5*   < > 11.9* 12.3* 12.6* 12.8*  HCT 38.6*   < > 36.2* 36.4* 37.9* 40.0  MCV 87.9   < > 87.4 86.1 87.1 89.1  PLT 182   < >  159 178 155 143*   < > = values in this interval not displayed.    Medications:    . aspirin  300 mg Rectal Daily  . heparin  5,000 Units Subcutaneous Q8H  . sodium chloride flush  3 mL Intravenous Q12H      Madelon Lips, MD [facility] pgr [phone] 11-27-2018, 9:25 AM

## 2018-12-11 NOTE — Death Summary Note (Addendum)
Death Summary  [name] IRS:[id number] DOB: [birth year] DOA: 16-Nov-2018  PCP: No primary care provider on file.  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 (HCC)   Acute metabolic encephalopathy   SOB (shortness of breath)   Chest pain   Goals of care, counseling/discussion   Palliative care by specialist   History of present illness:   84 year old male with history of multiple comorbidities including hypertension, peptic ulcer disease, dementia (wife denies this diagnosis), prostrate cancer, CKD stage III, CAD status post PTCA in 2003, chronic combined diastolic and systolic CHF with EF of 00% who was recently admitted to this [facility] for non-STEMI/CHF and discharged on July 2 with recommendations for conservative medical management/diuresis with Lasix as not felt candidate for cardiac cath, now presented to the ED on 11-16-22 with complaints of abdominal and chest pain. Patient also noted to have valvular heart disease on recent echo with severe aortic calcification/moderate to severe aortic stenosis.Chest x-ray on admission showed improved aeration and cardiomegaly with no infiltrates  Hospital Course:   Seen by cardiology in this admission and not felt to be a candidate for any intervention secondary to worsening renal function.Patient also seen by nephrology for AKI (creatinine jumped from 1.8->2.8->4.9) but not felt to be a candidate for hemodialysis given hypotension  and family was declining anyway.  Patient received gentle hydration in the initial hospital course after holding home Lasix.  Both cardiology and nephrology recommending palliative care and comfort care discussions.  Patient was evaluated by palliative care during the hospital course and there were ongoing discussions with wife.  Patient's code status was modified as DNR.  Patient noted to have elevated LFTs with AST/ALT in the low 200s, total bili around 1.4.  CT abdomen showed gallbladder sludge.  Right upper quadrant ultrasonogram showed possible acalculous cholecystitis with report of thickened/edematous gallbladder wall, patient not candidate for HIDA scan.  Likely contributing to poor oral intake/abdominal pain.  No fever but had elevated lactate on presentation . Patient was maintained on  IV Zosyn but not felt to be candidate for surgical interventions. There was suspicion for aspiration on admission but CXR showed no definite infiltrate. Seen by speech therapy this afternoon and cleared for pureed diet.   Patient has diagnosis of dementia and problem list since February 2019.He remained confused during the hospital course with periods of agitation, worse than baseline per wife. He received prn medications for agitation today as well but per nurse only helped to some extent. This afternoon , patient had intermittent restlessness and agitation. Wife was bedside. He suddenly slowed down and laid back in bed per nurse with tele showing PEA momentarily and rapidly progressed to asystole. Wife bedside throughout and aware of events. She was comforted. Death certificate filled.Time of death 18:12 on 2018-11-20  Time: 45 minutes  Signed:  Lizzet Hendley  [facility] 2018-11-20, 6:35 PM

## 2018-12-11 NOTE — Progress Notes (Addendum)
Patient went thru a very active agitation period from 1600 to 1800, crawling around the bed and trying to get off the bed, hallucinating seeing Jesus and singing hymns.  The patient suddenly laid down on the bed in supine position.  Patient's heart rate slowly dropped to PEA.  Time of death confirmed by Probation officer and charge nurse Fabienne Bruns RN by auscultation and palpation to be at [employer].  Patient's wife and Probation officer at the bedside.  Attending MD notified of passing of patient

## 2018-12-11 NOTE — Progress Notes (Signed)
PROGRESS NOTE    [name]  DXA:[id number]  DOB: 1934-09-28  DOA: 11/24/2018 PCP: No primary care provider on file.  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 Patient also noted to have valvular heart disease on recent echo with severe aortic calcification/moderate to severe aortic stenosis. Seen by cardiology in this admission and not felt to be a candidate for any intervention secondary to worsening renal function. Patient also seen by nephrology for AKI (creatinine jumped from 1.8->2.8->4.9) but not felt to be a candidate for hemodialysis given hypotension and family refusing as well.  Patient received gentle hydration in the initial hospital course after holding home Lasix.  Both cardiology and nephrology recommending palliative care and comfort care discussions.  Patient has also been evaluated by palliative care during the hospital course) ongoing discussions with wife.  Patient is now DNR.  Subjective:  Patient seen with bedside nurse.  Per nurse patient received fentanyl for complaints of pain and apparently was agitated.  Wife went home and not at bedside currently.  His O2 nasal cannula is displaced to his neck. He is awake but appears restless/moaning.  Not eating much and noted to be on clear liquid diet.  Objective: Vitals:   24-Nov-2018 0530 11-24-2018 0625 11/24/18 0630 11/24/18 0740  BP:  (!) 89/54 (!) 93/50 (!) 88/42  Pulse:   71 68  Resp:      Temp:    (!) 97.3 F  (36.3 C)  TempSrc:    Oral  SpO2:  100% 100% 100%  Weight: 59.8 kg     Height:        Intake/Output Summary (Last 24 hours) at November 24, 2018 1019 Last data filed at 24-Nov-2018 0658 Gross per 24 hour  Intake 777.22 ml  Output 175 ml  Net 602.22 ml   Filed Weights   11/23/2018 0915 11/17/2018 1831 2018-11-24 0530  Weight: 59 kg 56.5 kg 59.8 kg    Physical Examination:  General exam: Appears somewhat restless Respiratory system: Clear to auscultation. Respiratory effort: Mildly tachypneic Cardiovascular system: S1 & S2 heard, + systolic ejection murmur. No JVD, murmurs, rubs, gallops or clicks. No pedal edema. Gastrointestinal system: Abdomen is nondistended, soft and nontender. No organomegaly or masses felt. Normal bowel sounds heard. Central nervous system: Awake, disoriented.  Moving all extremities.  No focal neurological deficits. Skin: No rashes, lesions or ulcers Psychiatry: Judgement and insight impaired. Mood & affect anxious    Data Reviewed: I have personally reviewed following labs and imaging studies  CBC: Recent Labs  Lab 12/09/2018 0958 11/16/18 0131 11/16/18 1902 11/17/18 0314 11/18/18 0252 11-24-2018 0234  WBC 7.2 8.6 6.5 7.8 9.6 8.4  NEUTROABS 3.7  --  4.4 5.0  --   --   HGB 12.5* 13.2 11.9* 12.3* 12.6* 12.8*  HCT 38.6* 41.1 36.2* 36.4* 37.9* 40.0  MCV 87.9 88.6 87.4 86.1 87.1 89.1  PLT 182 140* 159 178 155 947*   Basic Metabolic Panel: Recent Labs  Lab 11/16/18 0334 11/16/18 1902 11/17/18 0314 11/18/18 0252 24-Nov-2018 0234  NA 138 139  139 142 143  K 3.9 4.4 4.1 4.6 5.3*  CL 98 101 101 103 103  CO2 17* 19* 19* 17* 13*  GLUCOSE 197* 132* 124* 147* 132*  BUN 89* 98* 103* 126* 143*  CREATININE 3.83* 4.36* 4.54* 4.93* 6.03*  CALCIUM 8.9 8.6* 8.5* 8.4* 8.4*  MG  --  2.6* 2.7* 2.9* 3.3*   GFR: Estimated Creatinine Clearance: 7.8 mL/min (A) (by C-G formula based on SCr of 6.03 mg/dL (H)). Liver Function Tests: Recent Labs  Lab 12/07/2018 0958 11/16/18  0334 11/16/18 1902 11/17/18 0314 11/18/18 0252  AST 64* 84* 222* 191*  --   ALT 80* 98* 206* 212*  --   ALKPHOS 105 109 90 94  --   BILITOT 1.9* 1.6* 1.3* 1.4*  --   PROT 7.1 6.9 6.6 6.5  --   ALBUMIN 3.4* 3.2* 3.1* 3.1* 3.0*   Recent Labs  Lab 12/04/2018 0958  LIPASE 55*   No results for input(s): AMMONIA in the last 168 hours. Coagulation Profile: No results for input(s): INR, PROTIME in the last 168 hours. Cardiac Enzymes: No results for input(s): CKTOTAL, CKMB, CKMBINDEX, TROPONINI in the last 168 hours. BNP (last 3 results) Recent Labs    10/05/18 1555  PROBNP 12,132*   HbA1C: No results for input(s): HGBA1C in the last 72 hours. CBG: Recent Labs  Lab 12/03/2018 2321  GLUCAP 138*   Lipid Profile: No results for input(s): CHOL, HDL, LDLCALC, TRIG, CHOLHDL, LDLDIRECT in the last 72 hours. Thyroid Function Tests: No results for input(s): TSH, T4TOTAL, FREET4, T3FREE, THYROIDAB in the last 72 hours. Anemia Panel: No results for input(s): VITAMINB12, FOLATE, FERRITIN, TIBC, IRON, RETICCTPCT in the last 72 hours. Sepsis Labs: Recent Labs  Lab 11/16/18 1902 11/17/18 0314 11/18/18 1422  LATICACIDVEN 3.0* 2.5* 5.1*    Recent Results (from the past 240 hour(s))  SARS Coronavirus 2 (CEPHEID - Performed in [place] hospital lab), Hosp Order     Status: None   Collection Time: 11/09/18  6:32 PM   Specimen: Nasopharyngeal Swab  Result Value Ref Range Status   SARS Coronavirus 2 NEGATIVE NEGATIVE Final    Comment: (NOTE) If result is NEGATIVE SARS-CoV-2 target nucleic acids are NOT DETECTED. The SARS-CoV-2 RNA is generally detectable in upper and lower  respiratory specimens during the acute phase of infection. The lowest  concentration of SARS-CoV-2 viral copies this assay can detect is 250  copies / mL. A negative result does not preclude SARS-CoV-2 infection  and should not be used as the sole basis for treatment or other  patient management decisions.  A  negative result may occur with  improper specimen collection / handling, submission of specimen other  than nasopharyngeal swab, presence of viral mutation(s) within the  areas targeted by this assay, and inadequate number of viral copies  (<250 copies / mL). A negative result must be combined with clinical  observations, patient history, and epidemiological information. If result is POSITIVE SARS-CoV-2 target nucleic acids are DETECTED. The SARS-CoV-2 RNA is generally detectable in upper and lower  respiratory specimens dur ing the acute phase of infection.  Positive  results are indicative of active infection with SARS-CoV-2.  Clinical  correlation with patient history and other diagnostic information is  necessary to determine patient infection status.  Positive results do  not rule out bacterial infection or co-infection with other viruses. If result is PRESUMPTIVE POSTIVE SARS-CoV-2 nucleic acids MAY BE PRESENT.   A presumptive positive result was obtained on the  submitted specimen  and confirmed on repeat testing.  While 2019 novel coronavirus  (SARS-CoV-2) nucleic acids may be present in the submitted sample  additional confirmatory testing may be necessary for epidemiological  and / or clinical management purposes  to differentiate between  SARS-CoV-2 and other Sarbecovirus currently known to infect humans.  If clinically indicated additional testing with an alternate test  methodology [phone]) is advised. The SARS-CoV-2 RNA is generally  detectable in upper and lower respiratory sp ecimens during the acute  phase of infection. The expected result is Negative. Fact Sheet for Patients:  StrictlyIdeas.no Fact Sheet for Healthcare Providers: BankingDealers.co.za This test is not yet approved or cleared by the [place] FDA and has been authorized for detection and/or diagnosis of SARS-CoV-2 by FDA under an Emergency Use  Authorization (EUA).  This EUA will remain in effect (meaning this test can be used) for the duration of the COVID-19 declaration under Section 564(b)(1) of the Act, 21 U.S.C. section 360bbb-3(b)(1), unless the authorization is terminated or revoked sooner. Performed at [facility], [place] [address] [place], [place] [address]   SARS Coronavirus 2 (Performed in [facility] hospital lab)     Status: None   Collection Time: 11/26/2018  4:35 PM   Specimen: Nasal Swab  Result Value Ref Range Status   SARS Coronavirus 2 NEGATIVE NEGATIVE Final    Comment: (NOTE) SARS-CoV-2 target nucleic acids are NOT DETECTED. The SARS-CoV-2 RNA is generally detectable in upper and lower respiratory specimens during the acute phase of infection. Negative results do not preclude SARS-CoV-2 infection, do not rule out co-infections with other pathogens, and should not be used as the sole basis for treatment or other patient management decisions. Negative results must be combined with clinical observations, patient history, and epidemiological information. The expected result is Negative. Fact Sheet for Patients: SugarRoll.be Fact Sheet for Healthcare Providers: https://www.woods-mathews.com/ This test is not yet approved or cleared by the [place] FDA and  has been authorized for detection and/or diagnosis of SARS-CoV-2 by FDA under an Emergency Use Authorization (EUA). This EUA will remain  in effect (meaning this test can be used) for the duration of the COVID-19 declaration under Section 56 4(b)(1) of the Act, 21 U.S.C. section 360bbb-3(b)(1), unless the authorization is terminated or revoked sooner. Performed at [facility], [place] [address], [place], [place] [address]   Culture, blood (routine x 2)     Status: None (Preliminary result)   Collection Time: 11/16/18  1:48 AM   Specimen: BLOOD  Result Value Ref Range Status   Specimen  Description BLOOD RIGHT ANTECUBITAL  Final   Special Requests   Final    BOTTLES DRAWN AEROBIC ONLY Blood Culture adequate volume   Culture   Final    NO GROWTH 2 DAYS Performed at [facility], [place] [address] [place], [place] [address]    Report Status PENDING  Incomplete  Culture, blood (routine x 2)     Status: None (Preliminary result)   Collection Time: 11/16/18  1:55 AM   Specimen: BLOOD  Result Value Ref Range Status   Specimen Description BLOOD RIGHT ANTECUBITAL  Final   Special Requests   Final    BOTTLES DRAWN AEROBIC ONLY Blood Culture results may not be optimal due to an inadequate volume of blood received in culture bottles   Culture   Final    NO GROWTH 2 DAYS Performed at [facility], [place],  [place] [address]    Report Status PENDING  Incomplete  MRSA PCR Screening     Status: None   Collection Time: 11/18/18 10:53 AM   Specimen: Nasal Mucosa; Nasopharyngeal  Result Value Ref Range Status   MRSA by PCR NEGATIVE NEGATIVE Final    Comment:        The GeneXpert MRSA Assay (FDA approved for NASAL specimens only), is one component of a comprehensive MRSA colonization surveillance program. It is not intended to diagnose MRSA infection nor to guide or monitor treatment for MRSA infections. Performed at [facility], [place] [address], [place], [place] [address]       Radiology Studies: Dg Chest Port 1 View  Result Date: 11/18/2018 CLINICAL DATA:  Chest pain.  Pleural effusion. EXAM: PORTABLE CHEST 1 VIEW COMPARISON:  11/16/2018. FINDINGS: Prior CABG. Stable cardiomegaly with normal pulmonary vascularity. Lungs are clear. No pleural effusion or pneumothorax. No acute bony abnormality. IMPRESSION: 1. Prior CABG. Stable cardiomegaly. No pulmonary venous congestion. 2.  No pleural effusion noted on today's exam. Electronically Signed   By: Marcello Moores  Register   On: 11/18/2018 07:40        Scheduled Meds: . aspirin  300 mg Rectal  Daily  . heparin  5,000 Units Subcutaneous Q8H  . sodium chloride flush  3 mL Intravenous Q12H   Continuous Infusions: . dextrose 5 % and 0.9% NaCl 50 mL/hr at 11/18/18 2333  . piperacillin-tazobactam (ZOSYN)  IV 2.25 g (November 26, 2018 0622)    Assessment & Plan:    1.  AKI: Worsening renal function with creatinine up to 6 today.  Patient on gentle IV hydration and concern for volume overload.  Not eating much.  Appears to be dry.  Will increase IV fluids to 60/h.  Labs in a.m.  2.  Recent non-STEMI:  Rectal aspirin daily.  Not candidate for cardiac intervention.  Not on any other cardiac meds and concern for hypotension  3.  Chronic combined systolic/diastolic CHF, valvular heart disease:Now off diuretics in concern for problem #1.  Watch for worsening pleural effusion on IV hydration.  Titrate O2 to off as tolerated.  4.  Acute respiratory failure with hypoxia: Respiratory alkalosis on ABG July 9.  Patient now off BiPAP.  Titrate O2 to off as tolerated.  Chest x-ray on admission showed improved aeration and cardiomegaly with no infiltrates.  Patient on Zosyn empirically for possible aspiration and problem #6.  Lung windows on CT showed pleural effusion. Could not test for PE with contrasted CT given problem #1 or VQ scan due to problem #5  5.  Dementia, poor oral intake, failure to thrive: Likely contributing to problem #1 as well.  Palliative care following closely and in discussions with wife.  Patient has diagnosis of dementia and problem list since February 2019.  CT abdomen on July 6 showed probable gallbladder sludge/haziness of the gallbladder wall, loss of abdominal/subcutaneous fat since last CT, mild mesenteric edema, moderate right pleural effusion with underlying atelectasis?  Pneumonia.    Aspiration precautions. Request speech therapy evaluation and recommendations for safe diet.  6.  Acalculous cholecystitis:Patient noted to have elevated LFTs with AST/ALT in the low 200s, total bili  around 1.4.  CT abdomen showed gallbladder sludge.  Right upper quadrant ultrasonogram showed possible acalculous cholecystitis with report of thickened/edematous gallbladder wall, patient not candidate for HIDA scan.  Likely contributing to poor oral intake/abdominal pain.  No fever but had elevated lactate on presentation.  On IV Zosyn.  Will discuss  with wife and consider consulting general surgery to see if patient would benefit from palliative cholecystotomy tube   DVT prophylaxis: Heparin Code Status: DNR Family / Patient Communication: Wife not present bedside.  Will call her Disposition Plan: To be decided.     LOS: 3 days    Time spent: 40 minutes   Guilford Shi, MD [facility] Pager [phone]  If 7PM-7AM, please contact night-coverage [email or web address] Password [email or web address] 2018-12-12, 10:19 AM

## 2018-12-11 NOTE — Care Management Important Message (Signed)
Important Message  Patient Details  Name: [name] MRN: [id number] Date of Birth: 08-30-1934   Medicare Important Message Given:  Yes     [name] 2018-11-21, 1:45 PM

## 2018-12-11 NOTE — Progress Notes (Signed)
Patient continues to be unwilling to swallow seroquel pill, wife will call RN when he asks for a drink of water

## 2018-12-11 NOTE — Progress Notes (Signed)
Daily Progress Note   Patient Name: [name]       Date: 28-Nov-2018 DOB: 1935/01/24  Age: 84 y.o. MRN#: [id number] Attending Physician: Guilford Shi, MD Primary Care Physician: No primary care provider on file. Admit Date: 11/30/2018  Reason for Consultation/Follow-up: Establishing goals of care  Subjective: Discussed with RN - patient off of bipap and oxygen, sats and work of breathing currently okay, eating some, still with some periods of agitation - 125 ml of urine output overnight and 100 this AM. Creatinine continues to rise - >6 now. Not a dialysis candidate.   Length of Stay: 3  Current Medications: Scheduled Meds:  . aspirin  300 mg Rectal Daily  . heparin  5,000 Units Subcutaneous Q8H  . sodium chloride flush  3 mL Intravenous Q12H    Continuous Infusions: . dextrose 5 % and 0.9% NaCl 60 mL/hr at 11/28/18 1145  . piperacillin-tazobactam (ZOSYN)  IV 2.25 g (11/28/18 0622)    PRN Meds: acetaminophen **OR** acetaminophen, fentaNYL (SUBLIMAZE) injection, nitroGLYCERIN, ondansetron **OR** ondansetron (ZOFRAN) IV     Vital Signs: BP 99/60 (BP Location: Left Arm)   Pulse 69   Temp (!) 97.3 F (36.3 C) (Oral)   Resp (!) 38   Ht 5' 3.5" (1.613 m)   Wt 59.8 kg   SpO2 100%   BMI 22.99 kg/m  SpO2: SpO2: 100 % O2 Device: O2 Device: Nasal Cannula O2 Flow Rate: O2 Flow Rate (L/min): 8 L/min  Intake/output summary:   Intake/Output Summary (Last 24 hours) at 11/28/2018 1238 Last data filed at Nov 28, 2018 1140 Gross per 24 hour  Intake 777.22 ml  Output 275 ml  Net 502.22 ml   LBM: Last BM Date: 11-28-18 Baseline Weight: Weight: 59 kg Most recent weight: Weight: 59.8 kg       Palliative Assessment/Data: PPS 10%    Flowsheet Rows     Most Recent Value  Intake Tab   Referral Department  Hospitalist  Unit at Time of Referral  Intermediate Care Unit  Palliative Care Primary Diagnosis  Nephrology  Date Notified  11/16/18  Palliative Care Type  New Palliative care  Reason for referral  Clarify Goals of Care  Date of Admission  12/02/2018  Date first seen by Palliative Care  11/16/18  # of days Palliative referral response time  0 Day(s)  # of days IP prior to Palliative referral  1  Clinical Assessment  Palliative Performance Scale Score  10%  Psychosocial & Spiritual Assessment  Palliative Care Outcomes  Patient/Family meeting held?  Yes  Who was at the meeting?  wife  Palliative Care Outcomes  Clarified goals of care, Provided advance care planning, Provided psychosocial or spiritual support, Changed CPR status      Patient Active Problem List   Diagnosis Date Noted  . Acute metabolic encephalopathy   . SOB (shortness of breath)   . Chest pain   . Goals of care, counseling/discussion   . Palliative care by specialist   . Acute kidney injury (AKI) with acute tubular necrosis (ATN) ([place]) 12/07/2018  . Hypertensive heart and renal disease with both (congestive) heart failure and renal failure ([place]) 11/21/2018  . Hyperkalemia 11/09/2018  . Rectal bleeding  08/03/2017  . Penile bleeding 08/03/2017  . Acute on chronic combined systolic and diastolic CHF (congestive heart failure) ([place]) 09/04/2016  . Chronic kidney disease, stage 3 ([place]) 08/04/2016  . Carotid artery disease ([place])   . Dementia ([place])   . Coronary artery disease of native heart with stable angina pectoris ([place])   . Ischemic cardiomyopathy   . NSTEMI (non-ST elevated myocardial infarction) ([place]) 01/27/2015  . Essential hypertension 01/27/2015  . Hyperlipidemia, unspecified 01/27/2015  . History of non-ST elevation myocardial infarction (NSTEMI) 01/27/2015  . Abdominal bruit 09/08/2013  . Mitral valvular insufficiency and aortic valvular insufficiency 03/23/2009  . PROSTATE CANCER  03/22/2009  . Hypertensive heart disease with chronic combined systolic and diastolic congestive heart failure ([place]) 03/22/2009    Palliative Care Assessment & Plan   HPI: 84 y.o. male  with past medical history of CAD, recent NSTEMI, CHF (EF 30%), CKD, PUD, HTN, HLD, prostate cancer, and dementia admitted on 12/10/2018 with abdominal pain and chest pain. Patient diagnosed with acute on chronic heart failure and is not a candidate for any interventions given his AKI and dementia. He is requiring bipap due to respiratory failure. Also has AKI with creatinine rising, no urine output today per RN. PMT consulted for [place].   Assessment: Follow up call again today with patient's wife, [name] - she is sitting at patient's bedside. She is quite frustrated during our conversation - she tells me "all anyone wants to talk about is his kidneys". We discussed why his kidneys are of such concern - she doesn't seem to accept this.   She tells me again that she just wants to take him home (yesterday we discussed we could try to get patient home with hospice support) she adds again without prompting "I do not want hospice!". We discussed concern of her taking patient home without any source of support - she tells me "He will do good at home".   She speaks in detail about her pentecostal faith and tells me "the [employer] will save him".   I ask her how the patient looks to her - does he look like he is improving to her - she admits that he does not and tells me he does look weaker today. However, she is encouraged that he is off of bipap and now eating some. She also tells me that when she asks him questions about her and where they live he usually givers her correct answers and this is giving her hope as well.   Patient's wife expresses gratitude for continued palliative involvement, emotional support, and information.   Patient's wife still seems to be unable to accept patient's poor prognosis despite multiple  conversations with multiple providers about the reality. She still firmly believes he will get better.   **It should be noted that patient's wife is under a lot of stress - she shared with me earlier in the week about multiple family members who have recently passed away (2 of them very young) - she is having difficulty processing the reality that her husband's prognosis is very poor.    Recommendations/Plan: -Multiple long conversations with patient's wife about poor prognosis and options moving forward - she continues to believe he will get better - refuses hospice, not interested in comfort care - continue current treatment - code status remains DNR per conversation with wife 7/7 - PMT will follow up next week  Goals of Care and Additional Recommendations:  Limitations on Scope of Treatment: No Artificial Feeding  Code  Status:  DNR  Prognosis:   Unable to determine - w/o significant improvement, suspect days  Discharge Planning:  To Be Determined - w/o significant improvement suspect hospital death  Care plan was discussed with RN and patient's wife  Thank you for allowing the Palliative Medicine Team to assist in the care of this patient.   Total Time 35 minutes Prolonged Time Billed  no    The above conversation was completed via telephone due to the visitor restrictions during the COVID-19 pandemic. Thorough chart review and discussion with necessary members of the care team was completed as part of assessment. All issues were discussed and addressed but no physical exam was performed.    Greater than 50%  of this time was spent counseling and coordinating care related to the above assessment and plan.  Juel Burrow, DNP, [facility] Palliative Medicine Team Team Phone # [phone]  Pager [phone]

## 2018-12-11 NOTE — Progress Notes (Signed)
Pharmacy Antibiotic Note  [name] is a 84 y.o. male admitted on 11/20/2018 with abdominal pain.  Pharmacy has been consulted for Zosyn dosing. Day 1 of Zosyn is 7/7. WBC WNL. Afebrile. Negative chest x-rays. Pt has cholecystitis. Continued decline in renal function. Per MD notes, continuing to pursue active treatment plans rather than comfort care.   Plan: Continue Zosyn 2.25g Q8H Trend WBC, temp, renal function  F/U infectious work-up for possible end date   Height: 5' 3.5" (161.3 cm) Weight: 131 lb 13.4 oz (59.8 kg) IBW/kg (Calculated) : 58.05  Temp (24hrs), Avg:97.2 F (36.2 C), Min:96.4 F (35.8 C), Max:98.1 F (36.7 C)  Recent Labs  Lab 11/16/18 0131 11/16/18 0334 11/16/18 1902 11/17/18 0314 11/18/18 0252 11/18/18 1422 12/04/2018 0234  WBC 8.6  --  6.5 7.8 9.6  --  8.4  CREATININE  --  3.83* 4.36* 4.54* 4.93*  --  6.03*  LATICACIDVEN  --   --  3.0* 2.5*  --  5.1*  --     Estimated Creatinine Clearance: 7.8 mL/min (A) (by C-G formula based on SCr of 6.03 mg/dL (H)).    Allergies  Allergen Reactions  . Ramipril Swelling and Other (See Comments)    Angioedema   . Statins Other (See Comments)    Elevated LFTs with statins in the past  . Latex Itching and Rash  . Coreg [Carvedilol] Itching and Other (See Comments)    CANNOT TAKE MYLAN BRAND due to fillers (patient uses Aurobindo Brand) Hallucinations Altered mental status   Thank you,   Eddie Candle, PharmD PGY-1 Pharmacy Resident  Phone: [phone]

## 2018-12-11 DEATH — deceased

## 2019-01-18 ENCOUNTER — Other Ambulatory Visit: Payer: Self-pay | Admitting: Physician Assistant

## 2019-12-10 IMAGING — CR CHEST - 2 VIEW
2 series · 2 of 2 positions shown · non-contrast
Comparison: 07/05/2018 and earlier.

CLINICAL DATA: 84-year-old male with chest pain and shortness of
breath for 1 week.

EXAM:
CHEST - 2 VIEW

[chest lat]
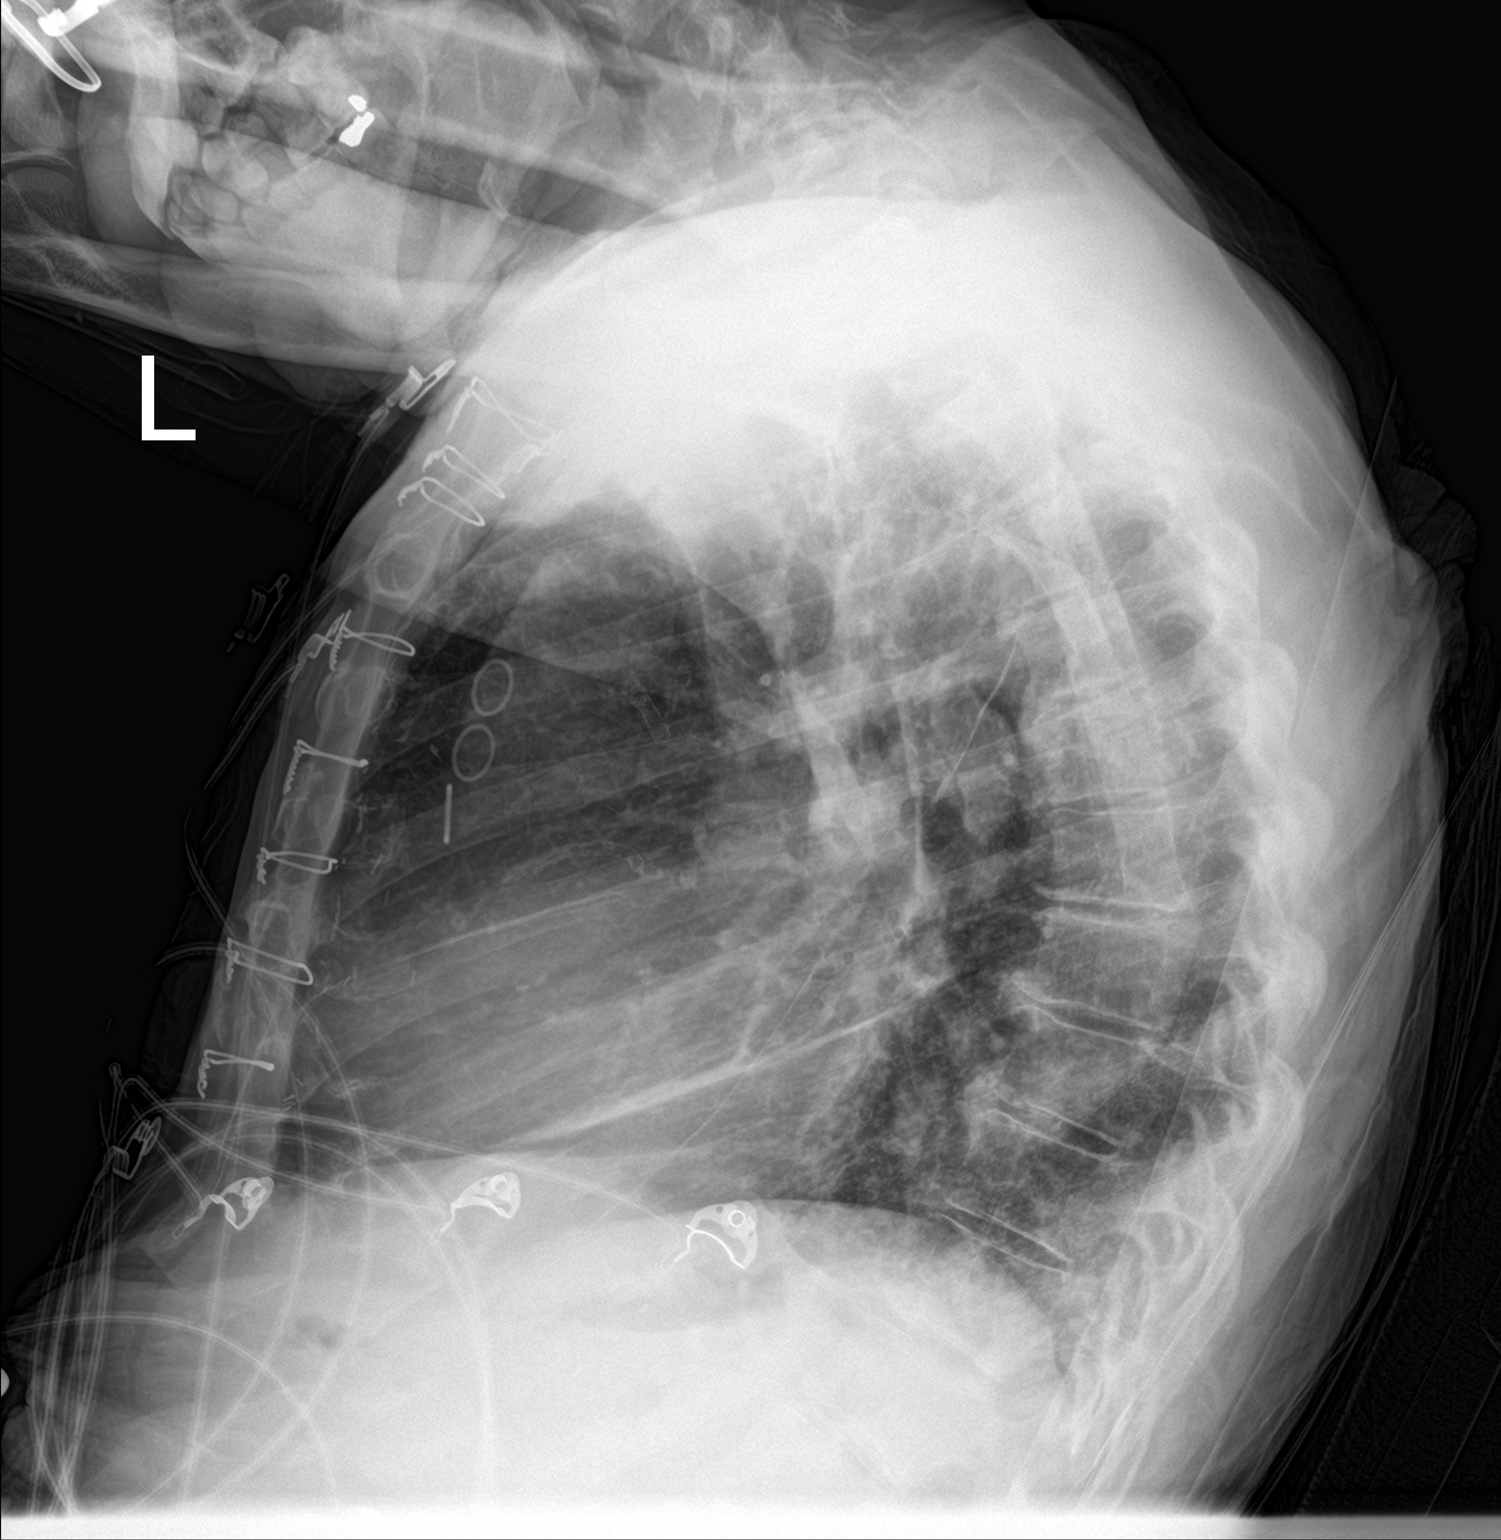

[chest ap]
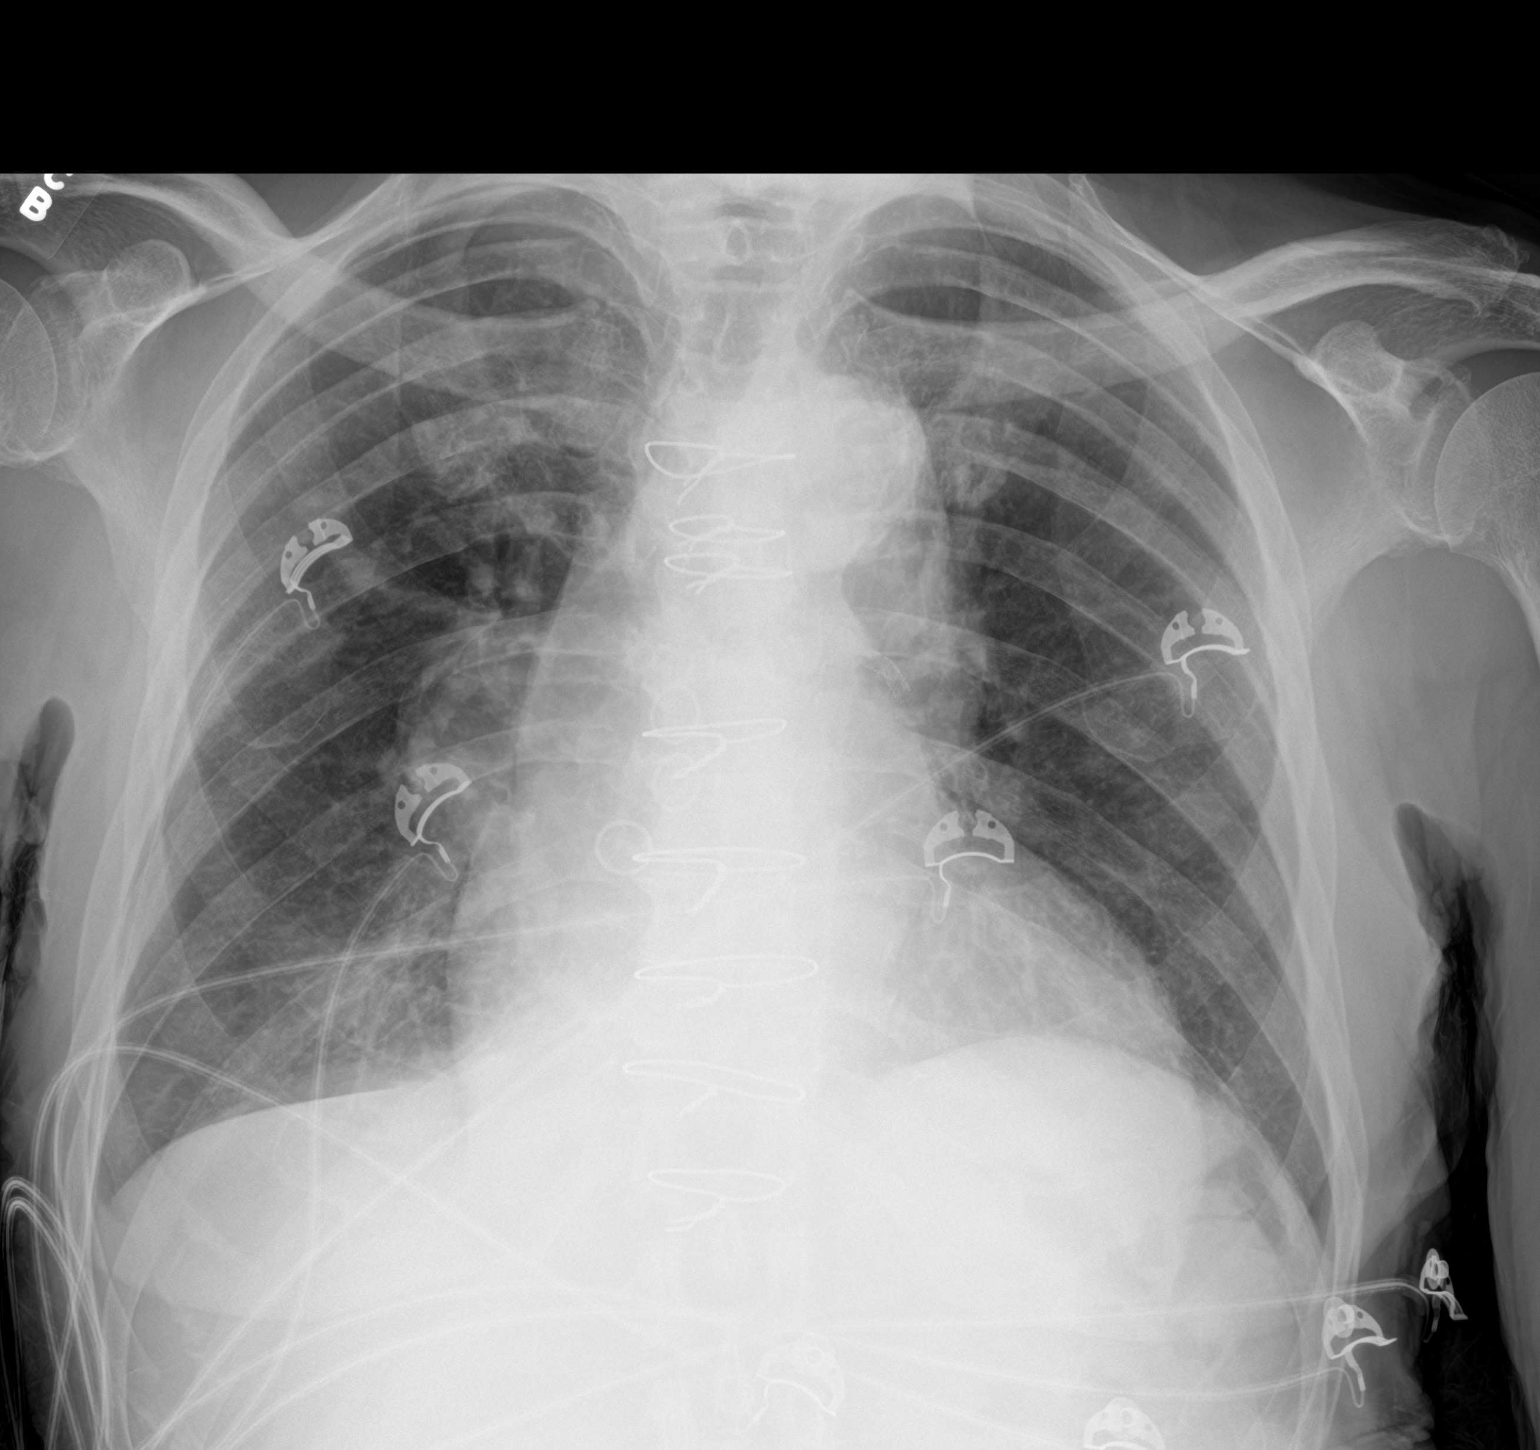

[2 of 2 positions shown; findings below may reference images not displayed]

FINDINGS: Upright AP and lateral views. Stable cardiomegaly and mediastinal
contours. Prior CABG. Stable tortuosity of the thoracic aorta. Other
mediastinal contours are within normal limits. Visualized tracheal
air column is within normal limits.

Mildly lower lung volumes. Trace fluid in the major fissures today,
although no layering pleural effusion is evident. No pneumothorax or
consolidation. Pulmonary vascularity is increased.

No acute osseous abnormality identified. Negative visible bowel gas
pattern.
IMPRESSION: 1. Mildly lower lung volumes with increased pulmonary vascularity
and trace pleural fluid. Consider mild or developing edema.
2. Stable cardiomegaly.

## 2019-12-17 IMAGING — DX PORTABLE CHEST - 1 VIEW
1 series · 2 of 2 positions shown · non-contrast
Comparison: Radiograph yesterday.

CLINICAL DATA: Shortness of breath and chest pain.

EXAM:
PORTABLE CHEST 1 VIEW

[Series 1: chest ap · 0.14mm/px · 2 of 2 slices shown]
[im 1/2]
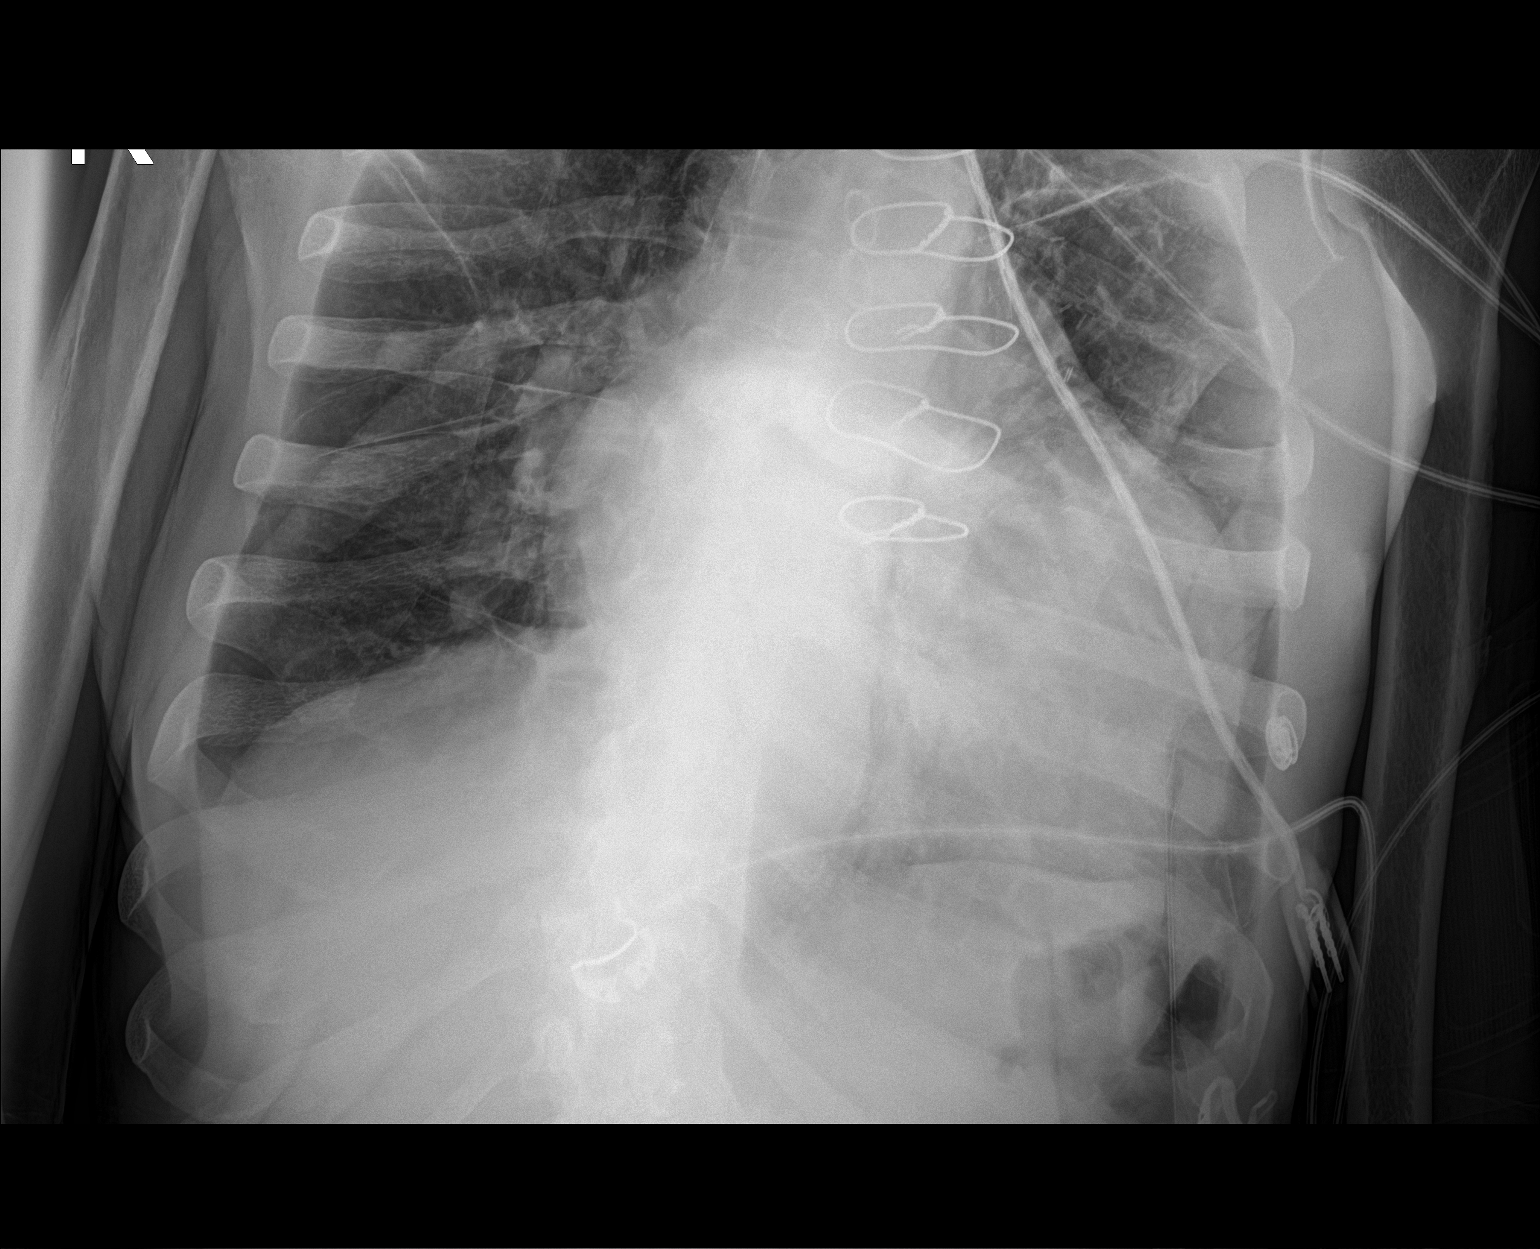
[im 2/2]
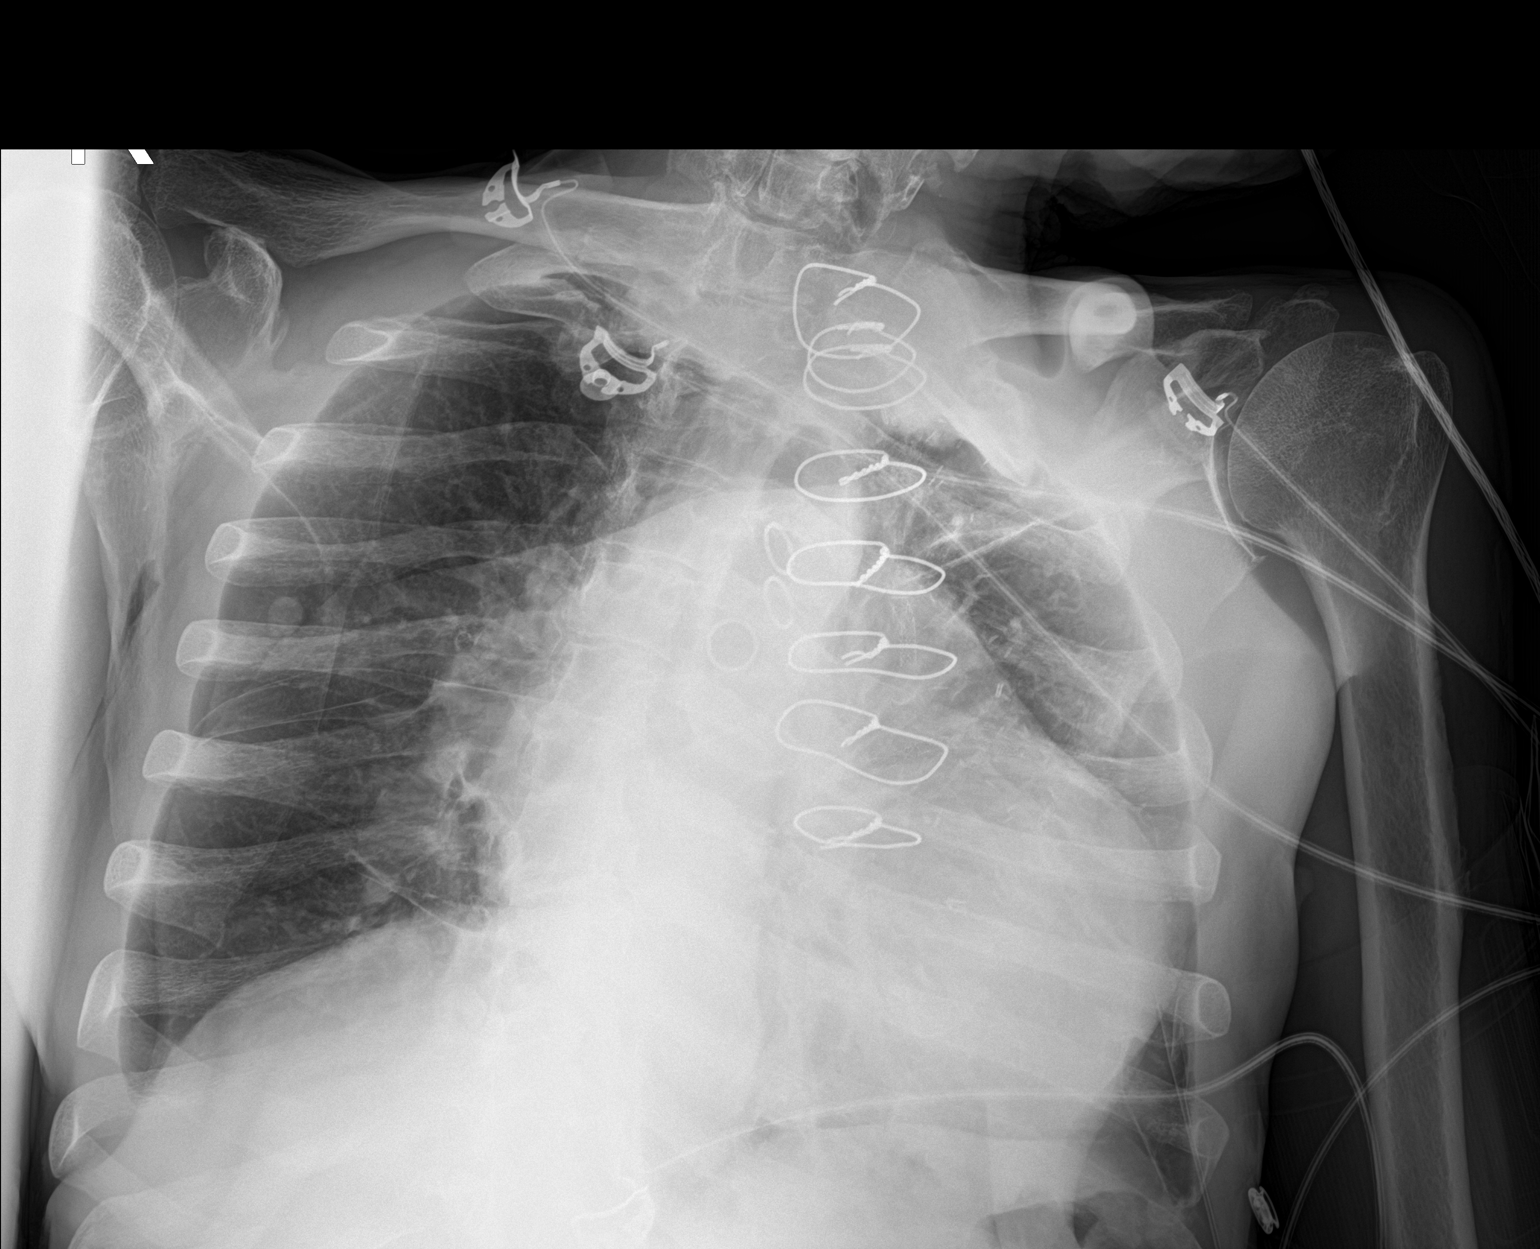

[2 of 2 positions shown; findings below may reference images not displayed]

FINDINGS: Patient had difficulty tolerating the exam, rotation despite being
held for images. Post median sternotomy with grossly stable
cardiomegaly. Possible vascular congestion. No evidence of focal
airspace disease. Right pleural effusion with blunting of
costophrenic angle no pneumothorax. Nodular density projecting over
the right mid lung is likely nipple shadow.
IMPRESSION: Technically limited study due to positioning. Cardiomegaly with
right pleural effusion. Possible vascular congestion.

## 2019-12-17 IMAGING — CT CT HEAD WITHOUT CONTRAST
4 series · 17 of 47 positions shown, 19 images · non-contrast
Comparison: None.

CLINICAL DATA: Altered level of consciousness.

EXAM:
CT HEAD WITHOUT CONTRAST
TECHNIQUE: Contiguous axial images were obtained from the base of the skull
through the vertex without intravenous contrast.

[Series 3: head without · axial · non-contrast · 0.46mm/px · z∈[-80,+50]mm · 7 of 36 slices shown, 9 images]
[im 5/36  brain]
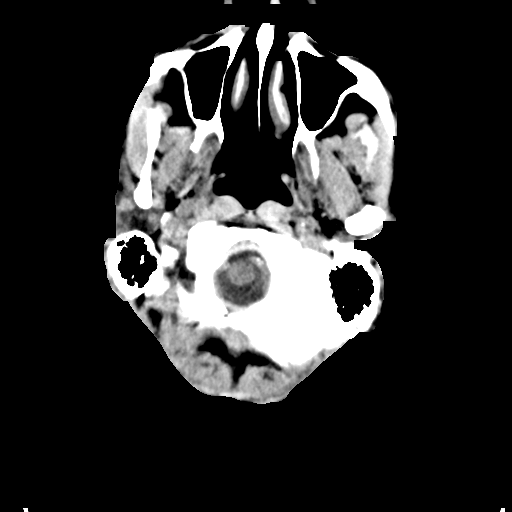
[im 5/36  bone]
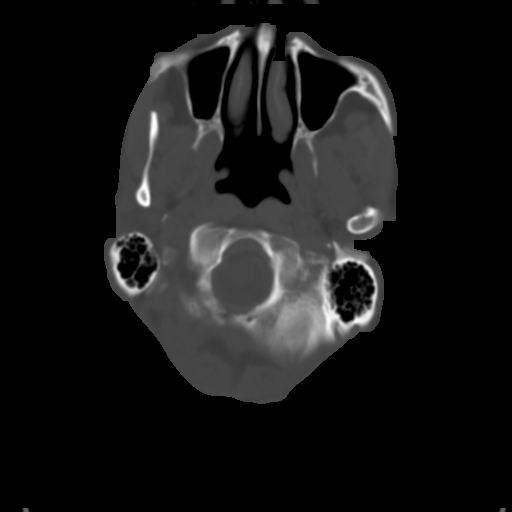
[im 9/36  brain]
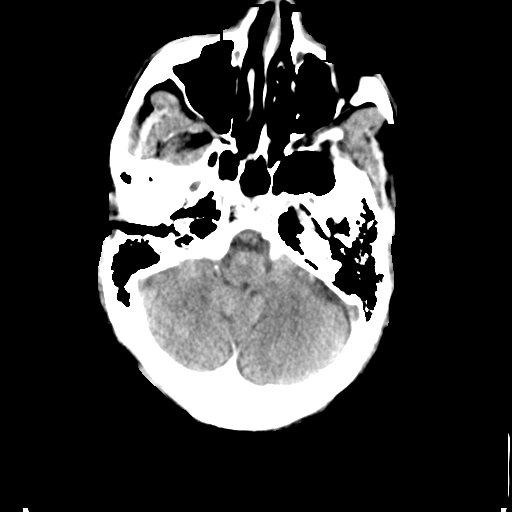
[im 14/36  brain]
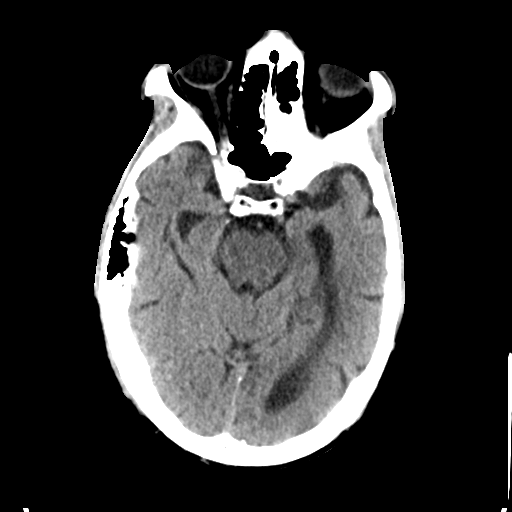
[im 18/36  brain]
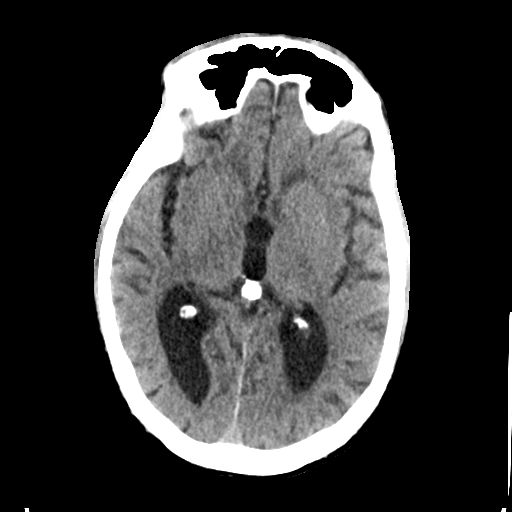
[im 22/36  brain]
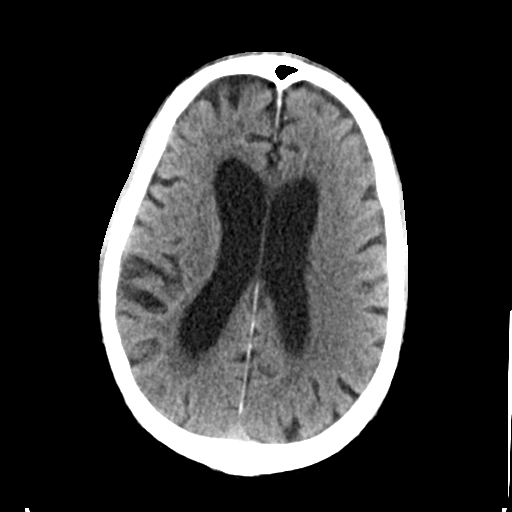
[im 22/36  bone]
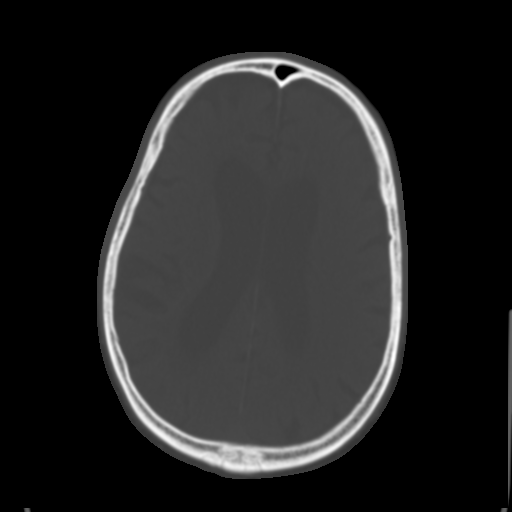
[im 27/36  brain]
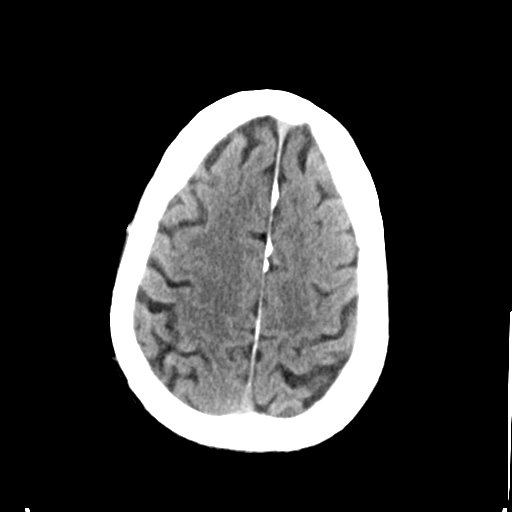
[im 31/36  brain]
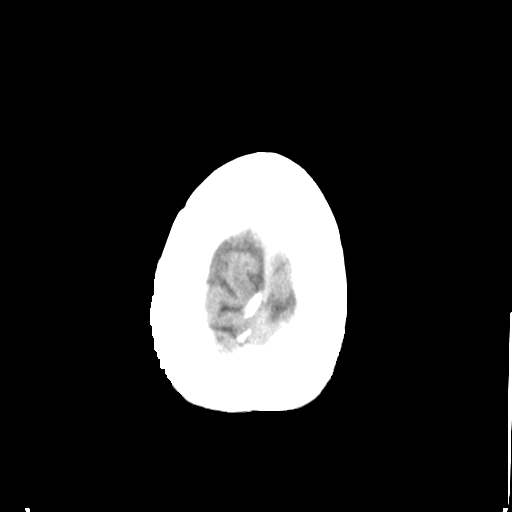

[Series 4: head bone · axial · 0.46mm/px · z∈[-84,-20]mm · 4 of 90 slices shown]
[im 9/90  bone]
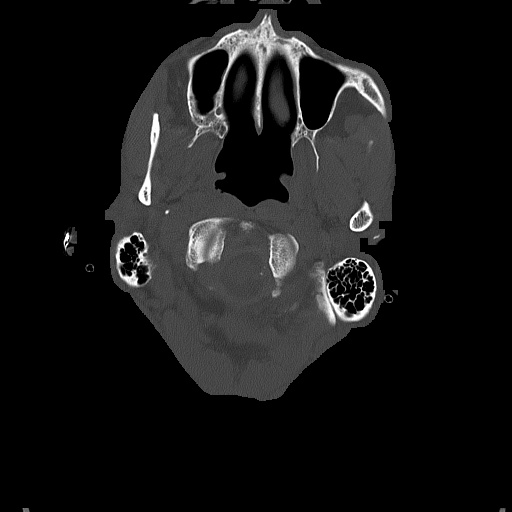
[im 18/90  bone]
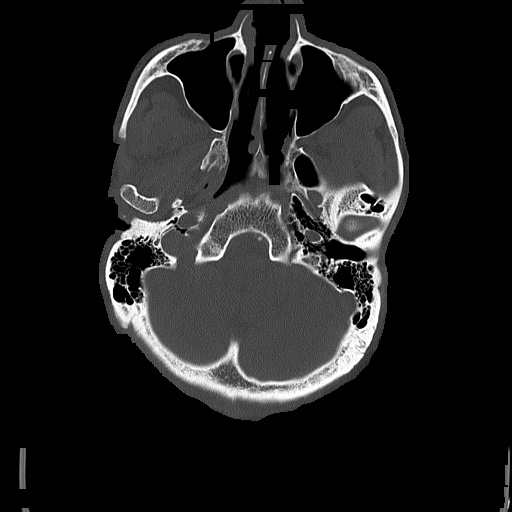
[im 27/90  bone]
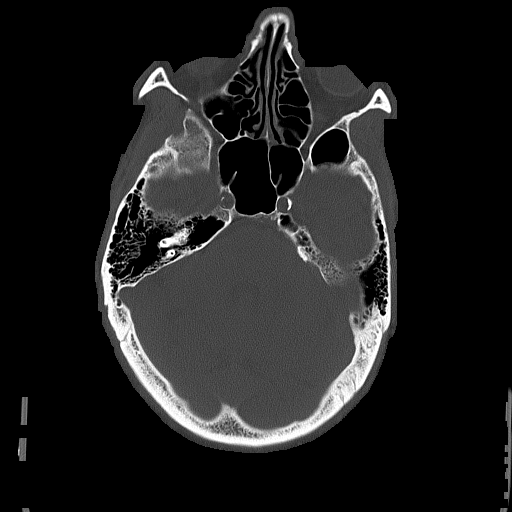
[im 41/90  bone]
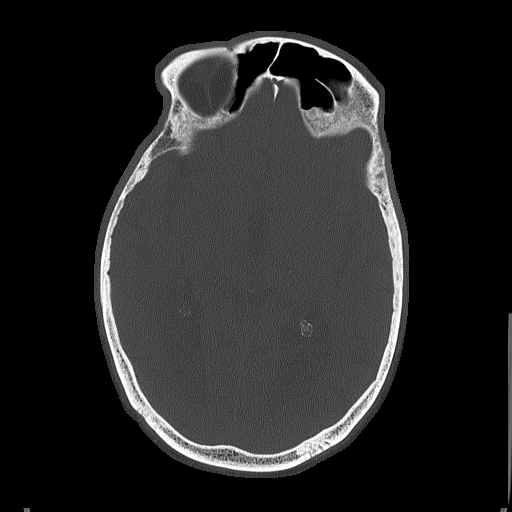

[Series 5: head without cor · coronal · non-contrast · 0.34mm/px · 3 of 73 slices shown]
[im 25/73  brain]
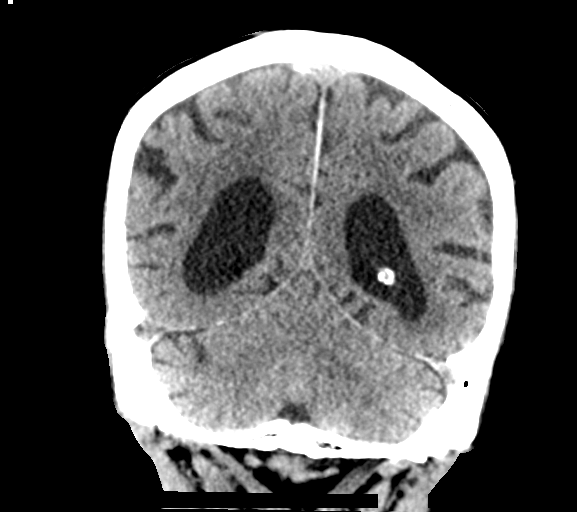
[im 33/73  brain]
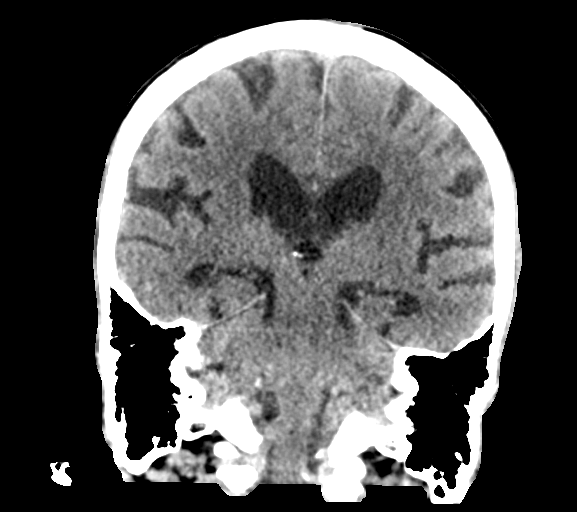
[im 41/73  brain]
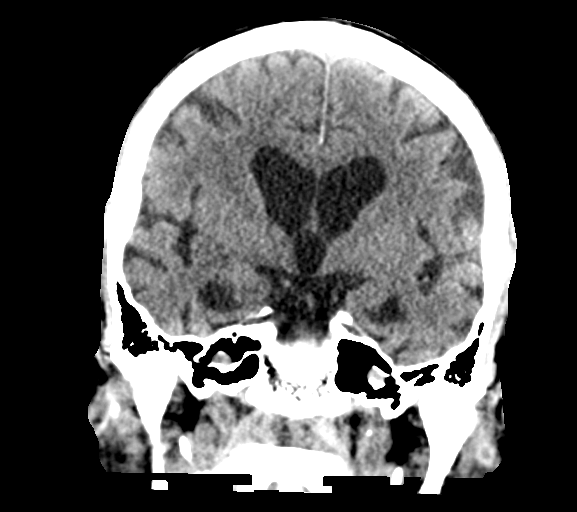

[Series 6: head without sag · sagittal · non-contrast · 0.35mm/px · 3 of 61 slices shown]
[im 21/61  brain]
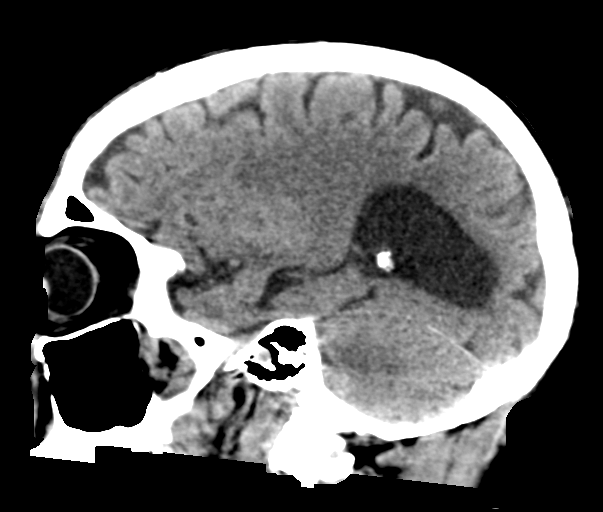
[im 31/61  brain]
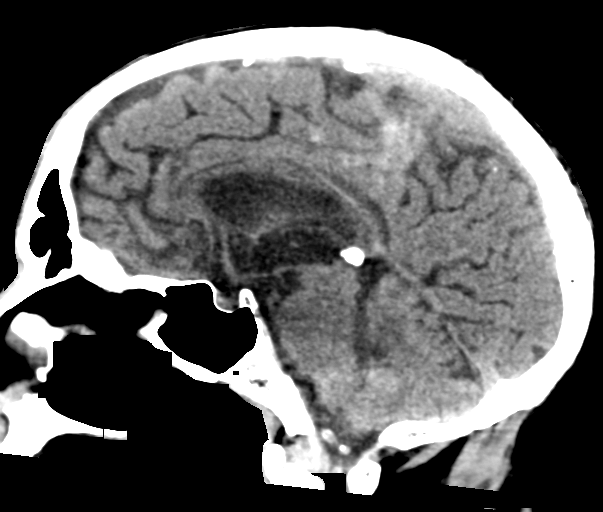
[im 41/61  brain]
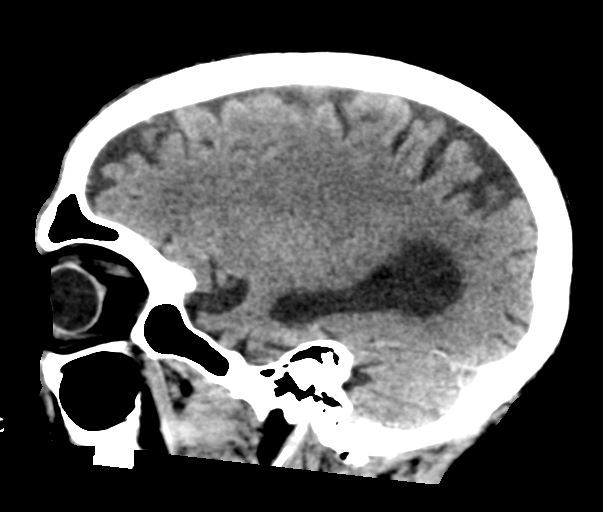

[17 of 47 positions shown; findings below may reference images not displayed]

FINDINGS: Brain: No evidence of acute infarction, hemorrhage, hydrocephalus,
extra-axial collection or mass lesion/mass effect. Moderate cerebral
atrophy. Moderate chronic small vessel ischemia. Remote lacunar
infarcts in the bilateral basal ganglia and right cerebellum.

Vascular: Atherosclerosis of skull base vasculature without
hyperdense vessel.

Skull: No fracture or focal lesion.

Sinuses/Orbits: Partial non pneumatization of posterior left ethmoid
air cells. No sinus fluid levels. Mastoid air cells are clear.
Orbits are unremarkable.

Other: None.
IMPRESSION: 1. No acute intracranial abnormality.
2. Atrophy, chronic small vessel ischemia, and remote lacunar
infarcts in the basal ganglia and right cerebellum.
